# Patient Record
Sex: Male | Born: 1960 | Race: Black or African American | Hispanic: No | Marital: Married | Smoking: Current every day smoker
Health system: Southern US, Community
[De-identification: ages and names within clinical notes are randomized; demographics above are authoritative.]

## PROBLEM LIST (undated history)

## (undated) DIAGNOSIS — C801 Malignant (primary) neoplasm, unspecified: Secondary | ICD-10-CM

## (undated) DIAGNOSIS — I10 Essential (primary) hypertension: Secondary | ICD-10-CM

## (undated) DIAGNOSIS — S069XAA Unspecified intracranial injury with loss of consciousness status unknown, initial encounter: Secondary | ICD-10-CM

## (undated) DIAGNOSIS — I639 Cerebral infarction, unspecified: Secondary | ICD-10-CM

## (undated) DIAGNOSIS — E119 Type 2 diabetes mellitus without complications: Secondary | ICD-10-CM

## (undated) DIAGNOSIS — S069X9A Unspecified intracranial injury with loss of consciousness of unspecified duration, initial encounter: Secondary | ICD-10-CM

## (undated) DIAGNOSIS — M1990 Unspecified osteoarthritis, unspecified site: Secondary | ICD-10-CM

## (undated) DIAGNOSIS — G629 Polyneuropathy, unspecified: Secondary | ICD-10-CM

## (undated) DIAGNOSIS — G459 Transient cerebral ischemic attack, unspecified: Secondary | ICD-10-CM

---

## 2015-07-26 DIAGNOSIS — I219 Acute myocardial infarction, unspecified: Secondary | ICD-10-CM

## 2015-07-26 HISTORY — DX: Acute myocardial infarction, unspecified: I21.9

## 2016-05-30 ENCOUNTER — Inpatient Hospital Stay: Payer: BLUE CROSS/BLUE SHIELD | Admitting: Internal Medicine

## 2016-05-30 ENCOUNTER — Observation Stay: Payer: BLUE CROSS/BLUE SHIELD

## 2016-05-30 ENCOUNTER — Inpatient Hospital Stay
Admission: EM | Admit: 2016-05-30 | Discharge: 2016-06-01 | DRG: 313 | Disposition: A | Discharge status: Home / Self Care | Payer: BLUE CROSS/BLUE SHIELD | Attending: Family Medicine | Admitting: Family Medicine

## 2016-05-30 DIAGNOSIS — Z8673 Personal history of transient ischemic attack (TIA), and cerebral infarction without residual deficits: Secondary | ICD-10-CM

## 2016-05-30 DIAGNOSIS — G4733 Obstructive sleep apnea (adult) (pediatric): Secondary | ICD-10-CM | POA: Diagnosis present

## 2016-05-30 DIAGNOSIS — E119 Type 2 diabetes mellitus without complications: Secondary | ICD-10-CM | POA: Diagnosis present

## 2016-05-30 DIAGNOSIS — R079 Chest pain, unspecified: Secondary | ICD-10-CM | POA: Diagnosis present

## 2016-05-30 DIAGNOSIS — R9431 Abnormal electrocardiogram [ECG] [EKG]: Secondary | ICD-10-CM | POA: Diagnosis present

## 2016-05-30 DIAGNOSIS — R0789 Other chest pain: Principal | ICD-10-CM | POA: Diagnosis present

## 2016-05-30 DIAGNOSIS — Z794 Long term (current) use of insulin: Secondary | ICD-10-CM

## 2016-05-30 DIAGNOSIS — F17200 Nicotine dependence, unspecified, uncomplicated: Secondary | ICD-10-CM | POA: Diagnosis present

## 2016-05-30 DIAGNOSIS — I10 Essential (primary) hypertension: Secondary | ICD-10-CM | POA: Diagnosis present

## 2016-05-30 DIAGNOSIS — M549 Dorsalgia, unspecified: Secondary | ICD-10-CM | POA: Diagnosis present

## 2016-05-30 DIAGNOSIS — G8929 Other chronic pain: Secondary | ICD-10-CM | POA: Diagnosis present

## 2016-05-30 DIAGNOSIS — N179 Acute kidney failure, unspecified: Secondary | ICD-10-CM | POA: Diagnosis present

## 2016-05-30 DIAGNOSIS — E1165 Type 2 diabetes mellitus with hyperglycemia: Secondary | ICD-10-CM | POA: Diagnosis present

## 2016-05-30 DIAGNOSIS — Z7902 Long term (current) use of antithrombotics/antiplatelets: Secondary | ICD-10-CM

## 2016-05-30 HISTORY — DX: Transient cerebral ischemic attack, unspecified: G45.9

## 2016-05-30 HISTORY — DX: Unspecified osteoarthritis, unspecified site: M19.90

## 2016-05-30 HISTORY — DX: Type 2 diabetes mellitus without complications: E11.9

## 2016-05-30 HISTORY — DX: Polyneuropathy, unspecified: G62.9

## 2016-05-30 HISTORY — DX: Essential (primary) hypertension: I10

## 2016-05-30 LAB — CBC AND DIFFERENTIAL
Absolute NRBC: 0 10*3/uL
Basophils Absolute Automated: 0.03 10*3/uL (ref 0.00–0.20)
Basophils Automated: 0.6 %
Eosinophils Absolute Automated: 0.1 10*3/uL (ref 0.00–0.70)
Eosinophils Automated: 1.9 %
Hematocrit: 39.5 % — ABNORMAL LOW (ref 42.0–52.0)
Hgb: 14.1 g/dL (ref 13.0–17.0)
Immature Granulocytes Absolute: 0.01 10*3/uL
Immature Granulocytes: 0.2 %
Lymphocytes Absolute Automated: 1.09 10*3/uL (ref 0.50–4.40)
Lymphocytes Automated: 20.5 %
MCH: 30.9 pg (ref 28.0–32.0)
MCHC: 35.7 g/dL (ref 32.0–36.0)
MCV: 86.4 fL (ref 80.0–100.0)
MPV: 9.7 fL (ref 9.4–12.3)
Monocytes Absolute Automated: 0.37 10*3/uL (ref 0.00–1.20)
Monocytes: 7 %
Neutrophils Absolute: 3.72 10*3/uL (ref 1.80–8.10)
Neutrophils: 69.8 %
Nucleated RBC: 0 /100 WBC (ref 0.0–1.0)
Platelets: 300 10*3/uL (ref 140–400)
RBC: 4.57 10*6/uL — ABNORMAL LOW (ref 4.70–6.00)
RDW: 12 % (ref 12–15)
WBC: 5.32 10*3/uL (ref 3.50–10.80)

## 2016-05-30 LAB — COMPREHENSIVE METABOLIC PANEL
ALT: 26 U/L (ref 0–55)
AST (SGOT): 15 U/L (ref 5–34)
Albumin/Globulin Ratio: 0.9 (ref 0.9–2.2)
Albumin: 3 g/dL — ABNORMAL LOW (ref 3.5–5.0)
Alkaline Phosphatase: 69 U/L (ref 38–106)
Anion Gap: 14 (ref 5.0–15.0)
BUN: 22 mg/dL (ref 9.0–28.0)
Bilirubin, Total: 0.6 mg/dL (ref 0.2–1.2)
CO2: 26 mEq/L (ref 22–29)
Calcium: 9.3 mg/dL (ref 8.5–10.5)
Chloride: 97 mEq/L — ABNORMAL LOW (ref 100–111)
Creatinine: 1.6 mg/dL — ABNORMAL HIGH (ref 0.7–1.3)
Globulin: 3.5 g/dL (ref 2.0–3.6)
Glucose: 396 mg/dL — ABNORMAL HIGH (ref 70–100)
Potassium: 3.6 mEq/L (ref 3.5–5.1)
Protein, Total: 6.5 g/dL (ref 6.0–8.3)
Sodium: 137 mEq/L (ref 136–145)

## 2016-05-30 LAB — ECG 12-LEAD
Atrial Rate: 69 {beats}/min
P Axis: 61 degrees
P-R Interval: 148 ms
Q-T Interval: 408 ms
QRS Duration: 88 ms
QTC Calculation (Bezet): 437 ms
R Axis: 9 degrees
T Axis: 217 degrees
Ventricular Rate: 69 {beats}/min

## 2016-05-30 LAB — TROPONIN I
Troponin I: 0.01 ng/mL (ref 0.00–0.09)
Troponin I: 0.02 ng/mL (ref 0.00–0.09)

## 2016-05-30 LAB — GLUCOSE WHOLE BLOOD - POCT
Whole Blood Glucose POCT: 268 mg/dL — ABNORMAL HIGH (ref 70–100)
Whole Blood Glucose POCT: 367 mg/dL — ABNORMAL HIGH (ref 70–100)

## 2016-05-30 LAB — GFR: EGFR: 45.4

## 2016-05-30 LAB — HEMOLYSIS INDEX: Hemolysis Index: 12 (ref 0–18)

## 2016-05-30 LAB — IHS D-DIMER: D-Dimer: <0.27 ug/mL FEU (ref 0.00–0.51)

## 2016-05-30 MED ORDER — DEXTROSE 10 % IV BOLUS
125.0000 mL | INTRAVENOUS | Status: DC | PRN
Start: 2016-05-30 — End: 2016-06-01

## 2016-05-30 MED ORDER — LISINOPRIL 20 MG PO TABS
40.0000 mg | ORAL_TABLET | Freq: Every day | ORAL | Status: DC
Start: 2016-05-31 — End: 2016-06-01
  Administered 2016-05-31 – 2016-06-01 (×2): 40 mg via ORAL
  Filled 2016-05-30 (×2): qty 2

## 2016-05-30 MED ORDER — HYDROCHLOROTHIAZIDE 25 MG PO TABS
25.0000 mg | ORAL_TABLET | Freq: Every day | ORAL | Status: DC
Start: 2016-05-30 — End: 2016-06-01
  Administered 2016-05-31 – 2016-06-01 (×2): 25 mg via ORAL
  Filled 2016-05-30 (×3): qty 1

## 2016-05-30 MED ORDER — GLUCOSE 40 % PO GEL
15.0000 g | ORAL | Status: DC | PRN
Start: 2016-05-30 — End: 2016-06-01

## 2016-05-30 MED ORDER — GLUCAGON 1 MG IJ SOLR (WRAP)
1.0000 mg | INTRAMUSCULAR | Status: DC | PRN
Start: 2016-05-30 — End: 2016-06-01

## 2016-05-30 MED ORDER — NALOXONE HCL 0.4 MG/ML IJ SOLN (WRAP)
0.2000 mg | INTRAMUSCULAR | Status: DC | PRN
Start: 2016-05-30 — End: 2016-06-01

## 2016-05-30 MED ORDER — NORTRIPTYLINE HCL 25 MG PO CAPS
25.0000 mg | ORAL_CAPSULE | Freq: Every evening | ORAL | Status: DC
Start: 2016-05-30 — End: 2016-06-01
  Administered 2016-05-30 – 2016-05-31 (×2): 25 mg via ORAL
  Filled 2016-05-30 (×2): qty 1

## 2016-05-30 MED ORDER — INSULIN LISPRO 100 UNIT/ML SC SOLN
1.0000 [IU] | Freq: Every evening | SUBCUTANEOUS | Status: DC | PRN
Start: 2016-05-30 — End: 2016-05-31
  Administered 2016-05-30: 3 [IU] via SUBCUTANEOUS
  Filled 2016-05-30: qty 9

## 2016-05-30 MED ORDER — SODIUM CHLORIDE 0.9 % IV BOLUS
1000.0000 mL | Freq: Once | INTRAVENOUS | Status: AC
Start: 2016-05-30 — End: 2016-05-30
  Administered 2016-05-30: 1000 mL via INTRAVENOUS

## 2016-05-30 MED ORDER — INSULIN GLARGINE 100 UNIT/ML SC SOLN
30.0000 [IU] | Freq: Every evening | SUBCUTANEOUS | Status: DC
Start: 2016-05-30 — End: 2016-05-31
  Administered 2016-05-30: 30 [IU] via SUBCUTANEOUS
  Filled 2016-05-30: qty 30

## 2016-05-30 MED ORDER — AMLODIPINE BESYLATE 5 MG PO TABS
5.0000 mg | ORAL_TABLET | Freq: Every day | ORAL | Status: DC
Start: 2016-05-30 — End: 2016-06-01
  Administered 2016-05-31 – 2016-06-01 (×2): 5 mg via ORAL
  Filled 2016-05-30 (×3): qty 1

## 2016-05-30 MED ORDER — INSULIN LISPRO 100 UNIT/ML SC SOLN
2.0000 [IU] | Freq: Three times a day (TID) | SUBCUTANEOUS | Status: DC | PRN
Start: 2016-05-30 — End: 2016-05-31
  Administered 2016-05-30: 6 [IU] via SUBCUTANEOUS
  Administered 2016-05-31: 8 [IU] via SUBCUTANEOUS
  Filled 2016-05-30: qty 18
  Filled 2016-05-30: qty 24

## 2016-05-30 MED ORDER — ACETAMINOPHEN 325 MG PO TABS
650.0000 mg | ORAL_TABLET | Freq: Four times a day (QID) | ORAL | Status: DC | PRN
Start: 2016-05-30 — End: 2016-06-01
  Administered 2016-05-30: 650 mg via ORAL
  Filled 2016-05-30: qty 2

## 2016-05-30 MED ORDER — CLOPIDOGREL BISULFATE 75 MG PO TABS
75.0000 mg | ORAL_TABLET | Freq: Every day | ORAL | Status: DC
Start: 2016-05-31 — End: 2016-06-01
  Administered 2016-05-31 – 2016-06-01 (×2): 75 mg via ORAL
  Filled 2016-05-30 (×2): qty 1

## 2016-05-30 MED ORDER — METOPROLOL SUCCINATE ER 50 MG PO TB24
100.0000 mg | ORAL_TABLET | Freq: Every day | ORAL | Status: DC
Start: 2016-05-31 — End: 2016-06-01
  Administered 2016-05-31 – 2016-06-01 (×2): 100 mg via ORAL
  Filled 2016-05-30 (×2): qty 2

## 2016-05-30 MED ORDER — BUPROPION HCL ER (XL) 150 MG PO TB24
150.0000 mg | ORAL_TABLET | Freq: Every day | ORAL | Status: DC
Start: 2016-05-31 — End: 2016-06-01
  Administered 2016-05-31 – 2016-06-01 (×2): 150 mg via ORAL
  Filled 2016-05-30 (×2): qty 1

## 2016-05-30 MED ORDER — HEPARIN SODIUM (PORCINE) 5000 UNIT/ML IJ SOLN
5000.0000 [IU] | Freq: Three times a day (TID) | INTRAMUSCULAR | Status: DC
Start: 2016-05-30 — End: 2016-06-01
  Administered 2016-05-30 – 2016-06-01 (×6): 5000 [IU] via SUBCUTANEOUS
  Filled 2016-05-30 (×6): qty 1

## 2016-05-30 MED ORDER — INSULIN REGULAR HUMAN 100 UNIT/ML IJ SOLN
10.0000 [IU] | Freq: Once | INTRAMUSCULAR | Status: AC
Start: 2016-05-30 — End: 2016-05-30
  Administered 2016-05-30: 10 [IU] via INTRAVENOUS
  Filled 2016-05-30: qty 30

## 2016-05-30 NOTE — ED Notes (Signed)
Bed: PU36  Expected date:   Expected time:   Means of arrival:   Comments:  Medic 202- cp

## 2016-05-30 NOTE — ED Provider Notes (Signed)
EMERGENCY DEPARTMENT HISTORY AND PHYSICAL EXAM     Physician/Midlevel provider first contact with patient: 05/30/16 1059         Date: 05/30/2016  Patient Name: Adam Bridges    History of Presenting Illness     Chief Complaint   Patient presents with   . Chest Pain   . Shortness of Breath       History Provided By: Patient    Chief Complaint: Chest pain  Duration: Beginning at 9:30 AM, now resolved  Timing: Constant  Location: Left-sided  Quality: "Dull"  Severity: Moderate  Exacerbating factors: Hx of DM and HTN  Alleviating factors: 324 mg Aspirin and 1 NTG spray given by EMS  Associated Symptoms: Near-syncope, SOB  Pertinent Negatives: No numbness or weakness    Additional History: Adam Bridges is a 55 y.o. male with hx of DM, HTN, and TIA presenting to the ED with CP beginning this morning at ~9:30 AM. 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 He describes the pain as "dull" on his left side. He also felt like he might lose consciousness at this time. He then sat down and began experiencing SOB. He states that his symptoms were not worse with exertion. He denies new numbness or weakness or family hx of heart disease. He states that he recently drove to [place] (4 hour drive), he took breaks during the trip. He is a social drinking, has a cigar occasionally. He was given 324 mg Aspirin and 1 nitro spray en route by EMS and now his symptoms are resolved.     PCP: Lum Keas, MD  SPECIALISTS: Dr.Srinivasan [facility]), [phone].    Current Facility-Administered Medications   Medication Dose Route Frequency Provider Last Rate Last Dose   . insulin regular (HumuLIN R,NovoLIN R) injection 10 Units  10 Units Intravenous Once Thea Gist, MD         Current Outpatient Prescriptions   Medication Sig Dispense Refill   . amLODIPine (NORVASC) 5 MG tablet Take 5 mg by mouth daily.     . Azilsartan-Chlorthalidone (EDARBYCLOR PO)  Take by mouth.     . clopidogrel (PLAVIX) 75 mg tablet Take 75 mg by mouth daily.     . insulin aspart (NOVOLOG) 100 UNIT/ML injection Inject into the skin 3 (three) times daily before meals.     . insulin detemir (LEVEMIR) 100 UNIT/ML injection Inject into the skin.     Marland Kitchen lisinopril (PRINIVIL,ZESTRIL) 40 MG tablet Take 40 mg by mouth daily.     . metFORMIN (FORTAMET) 500 MG (OSM) 24 hr tablet Take 500 mg by mouth every morning with breakfast.     . metoprolol succinate (TOPROL-XL) 100 MG 24 hr tablet Take 100 mg by mouth daily.     . nortriptyline (PAMELOR) 25 MG capsule Take 25 mg by mouth nightly.         Past History     Past Medical History:  Past Medical History:   Diagnosis Date   . Diabetes mellitus    . Hypertension    . TIA (transient ischemic attack)        Past Surgical History:  History reviewed. No pertinent surgical history.    Family History:  No family history on file.    Social History:  Social History   Substance Use Topics   . Smoking status: Light Tobacco Smoker     Types: Cigars   .  Smokeless tobacco: Never Used   . Alcohol use Yes      Comment: social       Allergies:  No Known Allergies    Review of Systems     Review of Systems   Constitutional: Positive for fatigue. Negative for fever.   Eyes: Negative for visual disturbance.   Respiratory: Positive for shortness of breath. Negative for chest tightness.    Cardiovascular: Positive for chest pain. Negative for palpitations.   Gastrointestinal: Negative for abdominal pain, nausea and vomiting.   Genitourinary: Negative for flank pain.   Musculoskeletal: Negative for back pain and neck pain.   Skin: Negative for pallor.   Neurological: Negative for weakness and numbness (chronic neuropathy, no new symptoms).        + Near-syncope   Psychiatric/Behavioral: Negative for agitation.       Physical Exam   BP 116/72   Pulse 71   Temp 98 F (36.7 C) (Oral)   Resp 16   SpO2 96%     Physical Exam   Constitutional: He is oriented to person, place,  and time. He appears well-developed and well-nourished.   HENT:   Head: Normocephalic and atraumatic.   Eyes: EOM are normal.   Neck: Normal range of motion. No JVD present.   Cardiovascular: Normal rate and intact distal pulses.    Pulmonary/Chest: Effort normal. No respiratory distress.   Abdominal: Soft. He exhibits no distension. There is no tenderness. There is no rebound.   Musculoskeletal: Normal range of motion. He exhibits no edema.   Neurological: He is alert and oriented to person, place, and time. He has normal strength. No cranial nerve deficit or sensory deficit. He exhibits normal muscle tone. Coordination normal.   Skin: Skin is warm and dry. No rash noted.   Psychiatric: He has a normal mood and affect. His behavior is normal.       Diagnostic Study Results     Labs -     Results     Procedure Component Value Units Date/Time    D-Dimer [[id number]] Collected:  05/30/16 1112     Updated:  05/30/16 1148     D-Dimer <0.27 ug/mL FEU     Troponin I [[id number]] Collected:  05/30/16 1113    Specimen:  Blood Updated:  05/30/16 1139     Troponin I 0.01 ng/mL     Comprehensive metabolic panel [[id number]]  (Abnormal) Collected:  05/30/16 1113    Specimen:  Blood Updated:  05/30/16 1133     Glucose 396 (H) mg/dL      BUN 08.6 mg/dL      Creatinine 1.6 (H) mg/dL      Sodium 578 mEq/L      Potassium 3.6 mEq/L      Chloride 97 (L) mEq/L      CO2 26 mEq/L      Calcium 9.3 mg/dL      Protein, Total 6.5 g/dL      Albumin 3.0 (L) g/dL      AST (SGOT) 15 U/L      ALT 26 U/L      Alkaline Phosphatase 69 U/L      Bilirubin, Total 0.6 mg/dL      Globulin 3.5 g/dL      Albumin/Globulin Ratio 0.9     Anion Gap 14.0    Hemolysis index [[id number]] Collected:  05/30/16 1113     Updated:  05/30/16 1133     Hemolysis Index 12    GFR [  [id number]] Collected:  05/30/16 1113     Updated:  05/30/16 1133     EGFR 45.4    CBC with differential [[id number]]  (Abnormal) Collected:  05/30/16 1112    Specimen:  Blood from Blood Updated:   05/30/16 1119     WBC 5.32 x10 3/uL      Hgb 14.1 g/dL      Hematocrit 78.2 (L) %      Platelets 300 x10 3/uL      RBC 4.57 (L) x10 6/uL      MCV 86.4 fL      MCH 30.9 pg      MCHC 35.7 g/dL      RDW 12 %      MPV 9.7 fL      Neutrophils 69.8 %      Lymphocytes Automated 20.5 %      Monocytes 7.0 %      Eosinophils Automated 1.9 %      Basophils Automated 0.6 %      Immature Granulocyte 0.2 %      Nucleated RBC 0.0 /100 WBC      Neutrophils Absolute 3.72 x10 3/uL      Abs Lymph Automated 1.09 x10 3/uL      Abs Mono Automated 0.37 x10 3/uL      Abs Eos Automated 0.10 x10 3/uL      Absolute Baso Automated 0.03 x10 3/uL      Absolute Immature Granulocyte 0.01 x10 3/uL      Absolute NRBC 0.00 x10 3/uL           Radiologic Studies -   Radiology Results (24 Hour)     ** No results found for the last 24 hours. **      .    Medical Decision Making   I am the first provider for this patient.    I reviewed the vital signs, available nursing notes, past medical history, past surgical history, family history and social history.    Vital Signs-Reviewed the patient's vital signs.     Patient Vitals for the past 12 hrs:   BP Temp Pulse Resp   05/30/16 1220 116/72 - 71 16   05/30/16 1053 132/80 98 F (36.7 C) 71 16       Pulse Oximetry Analysis - Normal 97% on RA    Cardiac Monitor:  Rate: 69  Rhythm:  Normal Sinus Rhythm     EKG:  Interpreted by the EP.   Time Interpreted: 1102   Rate: 69   Rhythm: Normal Sinus Rhythm    Interpretation: Normal PR, QRS and QT intervals. No ST segment changes. T wave inversions in V4-V6, leads I, II, aVL and aVF.   Comparison: No prior study is available for comparison.       EKG (Sent from cardiologist):  Interpreted by the EP.   Time Interpreted: 1247   Rate: 88   Rhythm: Normal Sinus Rhythm   Comparison: Compared to EKG from 1102 T wave inversions are new.      Core Measures:   Adult Chest Pain:  12-lead EKG was performed in the ED. Aspirin was not given in ED because given en route by  EMS.      Admit Decision Time:  The decision to admit this patient was made by the emergency provider at 1:14 PM on 05/30/2016     Clinical Decision Support:   Heart Score    Flowsheet Row Value   History  1  EKG  1   Risk Factors  1   Total (with age)  4   Onset of pain (time of START of last episode of chest pain)?  0-3 hrs ago   Timing of repeat Troponin/EKG order  HEART Score exceeds limit for Chest Pain Pathway      Wells/PERC Score    Flowsheet Row Value   Wells Score   Previous DVT or PE  0   HR greater than 100  0   Surgery within 4 weeks, or Immobilization in last 3 days  0   Clinical Signs/Symptoms of DVT  0   Alternative diagnosis less likely than PE  0   Hemoptysis  0   Malignancy with treatment within 6 months or palliative care  0   Wells Score for PE  0   Wells Rule Risk  Low Risk: <2 points (Continue to [facility] Calculator)   PERC Score   Age (read only)  greater than 50 years   HR >= 100  0   O2 Sat on Room Air < 95%  0   Prior History of Venous Thromboembolism  0   Trauma or Surgery within 4 weeks  0   Hemoptysis  0   Exogenous Estrogen  0   Unilateral Leg Swelling  0   PERC Rule Score (Calculated)  1   PERC Rule Risk  PERC rule is not satisfied and cannot be used to rule out PE.          Old Medical Records: Nursing notes.     ED Course:   11:28 AM - Discussed symptoms and plan for lab testing and chest XR. Patient is agreeable.    11:53 AM - Checked on patient and discussed abnormal EKG and heart score. Patient will try to have co-worker bring past EKG for comparison. Unsure of name of cardiologist. Patient states that he had a normal stress test in February, 2017.    12:27 PM - Checked on patient who is still working on getting EKG from cardiologist and name of cardiologist. Discussed results lab tests.     12:40 PM - Patient's wife states that cardiologist is Dr.Srinivasan [facility] [place]), [phone]. Will call for consult.     12:45 PM - Discussed patient case with  Dr.Srinivasan (cardiologist) who states that T wave inversions are new. He will fax over old EKG.    12:47 PM - Updated patient on conversation with Dr.Srinivasan and recommended hospitalization, he is agreeable. Patient states that he did not take his DM medication this morning, will order.     1:01 PM - Discussed patient case with Dr.Rubin (cardiologist) who will consult.     1:14 PM - Discussed patient case with Dr.Abdulaaima (sound, internal medicine) who agrees to accept.     1:20 PM - Discussed patient case with Dr.Rubin (cardiologist) who is consulting at bedside.     Provider Notes:   Based on my history, physical exam, and diagnostic evaluation, the patient presents to the emergency department with chest pain suggestive of ischemia. The pt has cardiac risk factors diabetes, tia, htn. Their physical exam was unremarkable including normal chest and respiratory exam. The diagnostic evaluation including laboratory data, and x-ray were non-diagnostic. Based on this evaluation, I do believe the symptoms are concerning for acute ischemic chest pain. I do not believe this is a vascular catastrophe such as a dissection or an acute rupture of an AAA. The pt is low risk for acute thrombo-embolic  phenomena. I discussed the case with the admitting attendings      Diagnosis     Clinical Impression:   1. Chest pain in adult        Treatment Plan:   ED Disposition     ED Disposition Condition Date/Time Comment    Observation  Mon May 30, 2016  1:16 PM Admitting Physician: Jolyn Lent [[id number]]   Diagnosis: Chest pain in adult [[id number]]   Estimated Length of Stay: < 2 midnights   Tentative Discharge Plan?: Home or Self Care [1]   Patient Class: Observation [104]              _______________________________      Attestations: This note is prepared by Fabio Bering, acting as scribe for Geannie Risen, MD.    Geannie Risen, MD - The scribe's documentation has been prepared under my direction and personally  reviewed by me in its entirety.  I confirm that the note above accurately reflects all work, treatment, procedures, and medical decision making performed by me.    _______________________________       Thea Gist, MD  06/03/16 [phone]

## 2016-05-30 NOTE — Plan of Care (Signed)
Problem: Chest Pain  Goal: Vital signs and cardiac rhythm stable  Outcome: Progressing   05/30/16 2158   Goal/Interventions addressed this shift   Vital signs and cardiac rhythm stable Monitor Adam Bridges vital signs/cardiac rhythms;Monitor labs

## 2016-05-30 NOTE — ED Triage Notes (Signed)
Adam Bridges BIBA from work cc chest pain. Pt reports pain started around 0930, and felt dull chest pain on the left side with some SOB. Pt has hx of TIA. EMS reports giving 324 mg aspirin @1037  and 1 nitro spray @ 1038 in route, which relieved pt's pain and sx. Pt denies N/V. EMS reports normal 12 L and vital signs. Pt reports dexi of 324 this AM.

## 2016-05-30 NOTE — Plan of Care (Signed)
Problem: Diabetes: Glucose Imbalance  Goal: Blood glucose stable at established goal  Outcome: Progressing   05/30/16 2157   Goal/Interventions addressed this shift   Blood glucose stable at established goal  Monitor lab values;Monitor intake and output. Notify LIP if urine output is < 30 mL/hour.;Assess for hypoglycemia /hyperglycemia;Monitor/assess vital signs;Coordinate medication administration with meals, as indicated;Ensure adequate hydration;Ensure appropriate diet and assess tolerance

## 2016-05-30 NOTE — ED Notes (Signed)
NURSING NOTE FOR THE RECEIVING INPATIENT NURSE   ED NURSE [place]   [place] [address]   ADMISSION INFORMATION   Adam Bridges is a 55 y.o. male admitted with a diagnosis of:    1. Chest pain in adult       NURSING CARE   LOC Oriented to person, place, time, and general circumstances.   ADL Independent with all ADLs   Pertinent Information Pt has hx of TIAs    VITAL SIGNS     Vitals:    05/30/16 1220   BP: 116/72   Pulse: 71   Resp: 16   Temp:    SpO2: 96%        IV LINES     IV Catheter Size: 20 g    Peripheral IV 05/30/16 Left Hand (Active)   Site Assessment Clean;Dry;Intact 05/30/2016 11:12 AM   Line Status Saline Locked 05/30/2016 11:12 AM   Dressing Status Clean;Dry;Intact 05/30/2016 11:12 AM   Number of days: 0          LAB RESULTS     Labs Reviewed   COMPREHENSIVE METABOLIC PANEL - Abnormal; Notable for the following:        Result Value    Glucose 396 (*)     Creatinine 1.6 (*)     Chloride 97 (*)     Albumin 3.0 (*)     All other components within normal limits   CBC AND DIFFERENTIAL - Abnormal; Notable for the following:     Hematocrit 39.5 (*)     RBC 4.57 (*)     All other components within normal limits   TROPONIN I   HEMOLYSIS INDEX   GFR   [facility] D-DIMER

## 2016-05-30 NOTE — H&P (Addendum)
[facility]      Patient: Adam Bridges  Date: 05/30/2016   DOB: Jan 31, 1961  Admission Date: 05/30/2016   MRN: [id number]  Attending: Jolyn Lent         Chief Complaint   Patient presents with   . Chest Pain   . Shortness of Breath      History Gathered From: the patient and ER physician     HISTORY AND PHYSICAL     SHEMUEL Bridges is a 55 y.o. 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     Past Medical History:   Diagnosis Date   . Diabetes mellitus    . Hypertension    . TIA (transient ischemic attack)        History reviewed. No pertinent surgical history.    Prior to Admission medications    Medication Sig Start Date End Date Taking? Authorizing Provider   amLODIPine (NORVASC) 5 MG tablet Take 5 mg by mouth daily.   Yes [provider]   Azilsartan-Chlorthalidone (EDARBYCLOR) 40-12.5 MG Tab Take by mouth.   Yes [provider]   buPROPion XL (WELLBUTRIN XL) 150 MG 24 hr tablet Take 150 mg by mouth daily.   Yes [provider]   clopidogrel (PLAVIX) 75 mg tablet Take 75 mg by mouth daily.   Yes [provider]   insulin aspart (NOVOLOG) 100 UNIT/ML injection Inject into the skin 3 (three) times daily before meals.Per sliding scale       Yes [provider]   insulin detemir (LEVEMIR) 100 UNIT/ML injection Inject 60 Units into the skin nightly.Per sliding scale       Yes [provider]   lisinopril (PRINIVIL,ZESTRIL) 40 MG tablet Take 40 mg by mouth daily.   Yes [provider]   metFORMIN (FORTAMET) 500 MG (OSM) 24 hr tablet Take 1,000 mg by mouth 2 (two) times daily with  meals.       Yes [provider]   metoprolol succinate (TOPROL-XL) 100 MG 24 hr tablet Take 100 mg by mouth daily.   Yes [provider]   nortriptyline (PAMELOR) 25 MG capsule Take 25 mg by mouth nightly.   Yes [provider]   Azilsartan-Chlorthalidone (EDARBYCLOR PO) Take by mouth.  05/30/16 Yes [provider]   buPROPion (ZYBAN) 150 MG 12 hr tablet Take 150 mg by mouth 2 (two) times daily.  05/30/16 Yes [provider]       No Known Allergies    CODE STATUS: Full code     PRIMARY CARE MD: Lum Keas, MD    No family history on file.    Social History   Substance Use Topics   . Smoking status: Light Tobacco Smoker     Types: Cigars   . Smokeless tobacco: Never Used   . Alcohol use Yes      Comment: social       REVIEW OF SYSTEMS   Positive for: CP, SOB, headache  Negative for: nausea, vomiting, abdominal pain, constipation, diarrhea, dizziness, sore throat, cough,  fever, chills   All ROS completed and otherwise negative.    PHYSICAL EXAM     Vital Signs (most recent): BP 116/72   Pulse 71   Temp 98 F (36.7 C) (Oral)   Resp 16   SpO2 96%   Constitutional: No apparent distress. Patient speaks freely in full sentences. Very pleasant obese middle aged male   HEENT: NC/AT, PERRL, no scleral icterus or conjunctival pallor, no nasal discharge, MMM, oropharynx without erythema or exudate  Neck: trachea midline, supple, no cervical or supraclavicular lymphadenopathy or masses  Cardiovascular: RRR, normal S1 S2, no murmurs, gallops, palpable thrills, no JVD, Non-displaced PMI.  Respiratory: Normal rate. No retractions or increased work of breathing. Clear to auscultation and percussion bilaterally.  Gastrointestinal: +BS, non-distended, soft, non-tender, no rebound or guarding, no hepatosplenomegaly  Genitourinary: no suprapubic or costovertebral angle tenderness  Musculoskeletal: ROM and motor strength grossly normal. No clubbing, edema, or cyanosis. DP and  radial pulses 2+ and symmetric.  Skin exam:  pink  Neurologic: EOMI, CN 2-12 grossly intact. no gross motor or sensory deficits  Psychiatric: AAOx3, affect and mood appropriate. The patient is alert, interactive, appropriate.  Capillary refill:  Normal    Exam done by Jolyn Lent, MD on 05/30/16 at 1:40 PM      LABS & IMAGING     Recent Results (from the past 24 hour(s))   CBC with differential    Collection Time: 05/30/16 11:12 AM   Result Value Ref Range    WBC 5.32 3.50 - 10.80 x10 3/uL    Hgb 14.1 13.0 - 17.0 g/dL    Hematocrit 16.1 (L) 42.0 - 52.0 %    Platelets 300 140 - 400 x10 3/uL    RBC 4.57 (L) 4.70 - 6.00 x10 6/uL    MCV 86.4 80.0 - 100.0 fL    MCH 30.9 28.0 - 32.0 pg    MCHC 35.7 32.0 - 36.0 g/dL    RDW 12 12 - 15 %    MPV 9.7 9.4 - 12.3 fL    Neutrophils 69.8 None %    Lymphocytes Automated 20.5 None %    Monocytes 7.0 None %    Eosinophils Automated 1.9 None %    Basophils Automated 0.6 None %    Immature Granulocyte 0.2 None %    Nucleated RBC 0.0 0.0 - 1.0 /100 WBC    Neutrophils Absolute 3.72 1.80 - 8.10 x10 3/uL    Abs Lymph Automated 1.09 0.50 - 4.40 x10 3/uL    Abs Mono Automated 0.37 0.00 - 1.20 x10 3/uL    Abs Eos Automated 0.10 0.00 - 0.70 x10 3/uL    Absolute Baso Automated 0.03 0.00 - 0.20 x10 3/uL    Absolute Immature Granulocyte 0.01 0 x10 3/uL    Absolute NRBC 0.00 0 x10 3/uL   D-Dimer    Collection Time: 05/30/16 11:12 AM   Result Value Ref Range    D-Dimer <0.27 0.00 - 0.51 ug/mL FEU   Comprehensive metabolic panel    Collection Time: 05/30/16 11:13 AM   Result Value Ref Range    Glucose 396 (H) 70 - 100 mg/dL    BUN 09.6 9.0 - 04.5 mg/dL    Creatinine 1.6 (H) 0.7 - 1.3 mg/dL    Sodium 409 811 - 914 mEq/L    Potassium 3.6 3.5 - 5.1 mEq/L    Chloride 97 (L) 100 - 111 mEq/L    CO2 26 22 - 29 mEq/L  Calcium 9.3 8.5 - 10.5 mg/dL    Protein, Total 6.5 6.0 - 8.3 g/dL    Albumin 3.0 (L) 3.5 - 5.0 g/dL    AST (SGOT) 15 5 - 34 U/L    ALT 26 0 - 55 U/L    Alkaline Phosphatase 69 38 -  106 U/L    Bilirubin, Total 0.6 0.2 - 1.2 mg/dL    Globulin 3.5 2.0 - 3.6 g/dL    Albumin/Globulin Ratio 0.9 0.9 - 2.2    Anion Gap 14.0 5.0 - 15.0   Troponin I    Collection Time: 05/30/16 11:13 AM   Result Value Ref Range    Troponin I 0.01 0.00 - 0.09 ng/mL   Hemolysis index    Collection Time: 05/30/16 11:13 AM   Result Value Ref Range    Hemolysis Index 12 0 - 18   GFR    Collection Time: 05/30/16 11:13 AM   Result Value Ref Range    EGFR 45.4          IMAGING:  Upon my review:   Chest 2 Views    Result Date: 05/30/2016    trace left pleural effusion Lorinda Creed, MD 05/30/2016 2:05 PM       CARDIAC:  EKG Interpretation (upon my review):  NSR with T waves inversion in V4-V6, I,II and aVL and aVF     Markers:    Recent Labs  Lab 05/30/16  1113   Troponin I 0.01       EMERGENCY DEPARTMENT COURSE:  Orders Placed This Encounter   Procedures   . Chest 2 Views   . Comprehensive metabolic panel   . CBC with differential   . Troponin I   . Hemolysis index   . GFR   . D-Dimer   . Cardiac monitoring (ED ONLY)   . ED Unit Sec Comm Order   . ED Unit Sec Comm Order   . ECG 12 Lead   . Saline lock IV   . Place (admit) for Observation Services   . [facility] ED Bed Request (Observation)       ASSESSMENT & PLAN     BIRCH FARINO is a 55 y.o. male admitted under sound physicians observation  with Chest pain in adult.    Patient Active Hospital Problem List:    Chest pain in adult (05/30/2016)  R/O ACS   D dimer negative ruling out PE     Serial cardiac enzymes   ChecK echocardiogram   Tele monitor   Cardiac consult   Continue plavix   Pain control      HTN (hypertension) (05/30/2016)  Chronic   Continue home meds   Patient advised that he is on ARBs and ACEi and should stop one of them and he stated that he will discuss with PCP      DM type 2 (diabetes mellitus, type 2) (05/30/2016)   check HbA1C    POCT FS with lispro SS    hold metformin   lantus decrease to 30 units at bedtime since he will be NPO after Midnight     H/O TIA    Continue Plavix     Nutrition  Cardiac carb consistent diet     DVT/VTE Prophylaxis  Heparin sub Q     Anticipated medical stability for discharge: 1-2 night     Service status/Reason for ongoing hospitalization: Observation/ Chest pain     Anticipated Discharge Needs: TBD     Signed,  Jolyn Lent    05/30/2016 1:40 PM  Time Elapsed: 45 minutes

## 2016-05-30 NOTE — Plan of Care (Signed)
Problem: Safety  Goal: Patient will be free from injury during hospitalization  Outcome: Progressing   05/30/16 2157   Goal/Interventions addressed this shift   Patient will be free from injury during hospitalization  Assess patient's risk for falls and implement fall prevention plan of care per policy;Provide and maintain safe environment;Hourly rounding

## 2016-05-30 NOTE — Plan of Care (Signed)
Problem: Chest Pain  Goal: Vital signs and cardiac rhythm stable  Outcome: Progressing   05/30/16 1544   Goal/Interventions addressed this shift   Vital signs and cardiac rhythm stable Monitor /assess vital signs/cardiac rhythms;Assess the need for oxygen therapy and administer as ordered     Goal: Cardiac pain management  Outcome: Progressing   05/30/16 1544   Goal/Interventions addressed this shift   Cardiac pain management  Assess/report chest pain/or related discomfort to LIP immediately;Instruct patient to report any change in pain status;Assess pain/or related discomfort on admission, during daily assessment, before and after any intervention;Include patient and patient care companion in decisions related to pain management     Goal: Anxiety management/effective coping  Outcome: Progressing   05/30/16 1544   Goal/Interventions addressed this shift   Anxiety management/effective coping  Assess/report uncontrolled anxiety, depression or ineffective coping to LIP;Encourage patient to immediately report any increase in anxiety and/or depression;Include patient in decision making of their care and give updates on their health status     Goal: Patient/Patient Care Companion demonstrates understanding of disease process, treatment plan, medications, and discharge plan  Outcome: Progressing      Problem: Diabetes: Glucose Imbalance  Goal: Blood glucose stable at established goal  Outcome: Progressing      Comments: PT is an admit from ED around 1440 via Stretcher. Pt denies any discomfort. NO apparent distress noted. Admitting VSS. Pt oriented to new environment and call bell and encouraged to call for assistance prn.

## 2016-05-30 NOTE — Consults (Signed)
[place] [facility] CONSULTATION REPORT  [facility]    Date Time: 05/30/16 1:03 PM  Patient Name: Adam Bridges, Adam Bridges  Date of Birth: 12-31-1960  CSN: [id number]  MRN: [id number]  Requesting Physician: Thea Gist, MD    Reason for Consultation:   Chest Pain  Abnormal EKG      History:   HORACIO WERTH is Bridges 55 y.o. male who presents to the hospital on 05/30/2016 with chest pain.  Multiple risk factors, no known h/o CAD - followed by Bridges cardiologist in [facility] with prior "normal" noninvasive ischemic evaluations.    This Am noted onset of Bridges left sided chest pain and felt like he was "going to pass out". Pain lasted 45 min up to an hour, since resolved. No prior similar episodes.   Asymptomatic when seen in ER      Past Medical History:     Past Medical History:   Diagnosis Date   . Diabetes mellitus    . Hypertension    . TIA (transient ischemic attack)        Past Surgical History:   History reviewed. No pertinent surgical history.    Family History:   No family history on file.    Social History:     Social History     Social History   . Marital status: Married     Spouse name: N/Bridges   . Number of children: N/Bridges   . Years of education: N/Bridges     Social History Main Topics   . Smoking status: Light Tobacco Smoker     Types: Cigars   . Smokeless tobacco: Never Used   . Alcohol use Yes      Comment: social   . Drug use: Unknown   . Sexual activity: Not on file     Other Topics Concern   . Not on file     Social History Narrative   . No narrative on file       Allergies:   No Known Allergies    Hospital Medications:      Scheduled Meds: PRN Meds:      sodium chloride 1,000 mL Intravenous Once       Continuous Infusions:             Home Medications:     . amLODIPine (NORVASC) 5 MG tablet Take 5 mg by mouth daily.     . Azilsartan-Chlorthalidone (EDARBYCLOR PO) Take by mouth.     Marland Kitchen buPROPion (ZYBAN) 150 MG 12 hr tablet Take 150 mg by mouth 2 (two) times daily.     . clopidogrel (PLAVIX) 75 mg tablet Take  75 mg by mouth daily.     . insulin aspart (NOVOLOG) 100 UNIT/ML injection Inject into the skin 3 (three) times daily before meals.     . insulin detemir (LEVEMIR) 100 UNIT/ML injection Inject into the skin.     Marland Kitchen lisinopril (PRINIVIL,ZESTRIL) 40 MG tablet Take 40 mg by mouth daily.     . metFORMIN (FORTAMET) 500 MG (OSM) 24 hr tablet Take 500 mg by mouth every morning with breakfast.     . metoprolol succinate (TOPROL-XL) 100 MG 24 hr tablet Take 100 mg by mouth daily.     . nortriptyline (PAMELOR) 25 MG capsule Take 25 mg by mouth nightly.                Review of Systems:    Comprehensive review of systems including constitutional, eyes, ears, nose, mouth, throat,  cardiovascular, GI, GU, musculoskeletal, integumentary, respiratory, neurologic, psychiatric, and endocrine is negative other than what is mentioned already in the history of present illness    Physical Exam:     VITAL SIGNS   Temp:  [98 F (36.7 C)] 98 F (36.7 C)  Heart Rate:  [71] 71  Resp Rate:  [16] 16  BP: (116-132)/(72-80) 116/72  No Data Recorded  SpO2: 96 %  No intake or output data in the 24 hours ending 05/30/16 1303       GENERAL: Patient is in no acute distress   HEENT: No scleral icterus or conjunctival pallor, moist mucous membranes   NECK: No jugular venous distention, normal carotid upstrokes without bruits   CARDIAC: Regular rate and rhythm, with normal S1 and S2, sysytolic murmur  CHEST: Clear to auscultation bilaterally, normal respiratory effort  ABDOMEN: Nontender, non-distended  EXTREMITIES: No edema  PULSES: Full and equal bilaterally.  SKIN: No rash or jaundice   NEUROLOGIC: Alert and oriented to time, place and person, normal mood and affect   MUSCULOSKELETAL: Normal muscle strength and tone.      Labs:     CBC w/Diff     Recent Labs  Lab 05/30/16  1112   WBC 5.32   Hgb 14.1   Hematocrit 39.5*   Platelets 300          Comprehensive Metabolic Profile     Recent Labs  Lab 05/30/16  1113   Sodium 137   Potassium  3.6   Chloride 97*   CO2 26   BUN 22.0   Creatinine 1.6*   Calcium 9.3   Albumin 3.0*   Protein, Total 6.5   Bilirubin, Total 0.6   Alkaline Phosphatase 69   ALT 26   AST (SGOT) 15   Glucose 396*          Cardiac Enzymes     Recent Labs  Lab 05/30/16  1113   Troponin I 0.01       No results for input(s): BNP in the last 8760 hours.       Thyroid Studies          Invalid input(s): FREET4        Lipid Profile            Coagulation Studies              EKG:   NSR Nonspecific ST-T changes      Telemetry:   NSR in ER    Rads:   No results found.     Assessment:    Chest Pain - lasted to 1 hours - now resolved   DM - glucose 396   HTN   Renal insufficiency - Cr 1.6   Abnormal EKG   H/o TIA   Chronic back pain - overall level of activity limited   OSA - Bipap    Recommendations:    Serial troponins. Would have low threshold with proceeding to cardiac cath. If troponins remain negative would proceed with Lexiscan though if troponins elevate then anticipate cardiac cath.   Echo   Check CXR - pending in ER    Signed by: Sandria Senter, MD    [facility]  NP Spectralink [phone] (8am-5pm)  MD Spectralink [phone] or [phone]  Dr Letitia Neri [phone] (8am-5pm)  After hours, non urgent consult line [phone]  After Hours, urgent consults [phone]

## 2016-05-31 ENCOUNTER — Inpatient Hospital Stay: Payer: BLUE CROSS/BLUE SHIELD

## 2016-05-31 ENCOUNTER — Ambulatory Visit (INDEPENDENT_AMBULATORY_CARE_PROVIDER_SITE_OTHER): Payer: Self-pay

## 2016-05-31 ENCOUNTER — Observation Stay: Payer: BLUE CROSS/BLUE SHIELD

## 2016-05-31 LAB — LIPID PANEL
Cholesterol / HDL Ratio: 5.4
Cholesterol: 211 mg/dL — ABNORMAL HIGH (ref 0–199)
HDL: 39 mg/dL — ABNORMAL LOW (ref 40–9999)
LDL Calculated: 145 mg/dL — ABNORMAL HIGH (ref 0–99)
Triglycerides: 137 mg/dL (ref 34–149)
VLDL Calculated: 27 mg/dL (ref 10–40)

## 2016-05-31 LAB — CBC AND DIFFERENTIAL
Absolute NRBC: 0 10*3/uL
Basophils Absolute Automated: 0.02 10*3/uL (ref 0.00–0.20)
Basophils Automated: 0.4 %
Eosinophils Absolute Automated: 0.34 10*3/uL (ref 0.00–0.70)
Eosinophils Automated: 6.5 %
Hematocrit: 37.9 % — ABNORMAL LOW (ref 42.0–52.0)
Hgb: 13.3 g/dL (ref 13.0–17.0)
Immature Granulocytes Absolute: 0.01 10*3/uL
Immature Granulocytes: 0.2 %
Lymphocytes Absolute Automated: 1.96 10*3/uL (ref 0.50–4.40)
Lymphocytes Automated: 37.3 %
MCH: 30.9 pg (ref 28.0–32.0)
MCHC: 35.1 g/dL (ref 32.0–36.0)
MCV: 88.1 fL (ref 80.0–100.0)
MPV: 9.8 fL (ref 9.4–12.3)
Monocytes Absolute Automated: 0.41 10*3/uL (ref 0.00–1.20)
Monocytes: 7.8 %
Neutrophils Absolute: 2.51 10*3/uL (ref 1.80–8.10)
Neutrophils: 47.8 %
Nucleated RBC: 0 /100 WBC (ref 0.0–1.0)
Platelets: 279 10*3/uL (ref 140–400)
RBC: 4.3 10*6/uL — ABNORMAL LOW (ref 4.70–6.00)
RDW: 12 % (ref 12–15)
WBC: 5.25 10*3/uL (ref 3.50–10.80)

## 2016-05-31 LAB — BASIC METABOLIC PANEL
Anion Gap: 13 (ref 5.0–15.0)
BUN: 20 mg/dL (ref 9.0–28.0)
CO2: 24 mEq/L (ref 22–29)
Calcium: 8.6 mg/dL (ref 8.5–10.5)
Chloride: 99 mEq/L — ABNORMAL LOW (ref 100–111)
Creatinine: 1.4 mg/dL — ABNORMAL HIGH (ref 0.7–1.3)
Glucose: 343 mg/dL — ABNORMAL HIGH (ref 70–100)
Potassium: 3.4 mEq/L — ABNORMAL LOW (ref 3.5–5.1)
Sodium: 136 mEq/L (ref 136–145)

## 2016-05-31 LAB — HEMOGLOBIN A1C
Average Estimated Glucose: 398.2 mg/dL
Average Estimated Glucose: 392.4 mg/dL
Hemoglobin A1C: 15.5 % — ABNORMAL HIGH (ref 4.6–5.9)
Hemoglobin A1C: 15.3 % — ABNORMAL HIGH (ref 4.6–5.9)

## 2016-05-31 LAB — GLUCOSE WHOLE BLOOD - POCT
Whole Blood Glucose POCT: 330 mg/dL — ABNORMAL HIGH (ref 70–100)
Whole Blood Glucose POCT: 465 mg/dL — ABNORMAL HIGH (ref 70–100)
Whole Blood Glucose POCT: 240 mg/dL — ABNORMAL HIGH (ref 70–100)

## 2016-05-31 LAB — TROPONIN I: Troponin I: 0.01 ng/mL (ref 0.00–0.09)

## 2016-05-31 LAB — MAGNESIUM: Magnesium: 1.8 mg/dL (ref 1.6–2.6)

## 2016-05-31 LAB — HEMOLYSIS INDEX
Hemolysis Index: 9 (ref 0–18)
Hemolysis Index: 50 — ABNORMAL HIGH (ref 0–18)

## 2016-05-31 LAB — GFR: EGFR: >60

## 2016-05-31 MED ORDER — TECHNETIUM TC 99M TETROFOSMIN INJECTION
1.0000 | Freq: Once | Status: AC | PRN
Start: 2016-05-31 — End: 2016-05-31
  Administered 2016-05-31: 1 via INTRAVENOUS

## 2016-05-31 MED ORDER — INSULIN LISPRO 100 UNIT/ML SC SOLN
20.0000 [IU] | Freq: Three times a day (TID) | SUBCUTANEOUS | Status: DC
Start: 2016-05-31 — End: 2016-06-01
  Administered 2016-05-31 – 2016-06-01 (×2): 20 [IU] via SUBCUTANEOUS
  Filled 2016-05-31: qty 60

## 2016-05-31 MED ORDER — DEXTROSE 10 % IV BOLUS
125.0000 mL | INTRAVENOUS | Status: DC | PRN
Start: 2016-05-31 — End: 2016-05-31

## 2016-05-31 MED ORDER — INSULIN GLARGINE 100 UNIT/ML SC SOLN
60.0000 [IU] | Freq: Every evening | SUBCUTANEOUS | Status: DC
Start: 2016-05-31 — End: 2016-06-01
  Administered 2016-05-31: 60 [IU] via SUBCUTANEOUS
  Filled 2016-05-31: qty 60

## 2016-05-31 MED ORDER — REGADENOSON 0.4 MG/5ML IV SOLN
INTRAVENOUS | Status: AC
Start: 2016-05-31 — End: 2016-05-31
  Filled 2016-05-31: qty 5

## 2016-05-31 MED ORDER — TECHNETIUM TC 99M TETROFOSMIN INJECTION
1.0000 | Freq: Once | Status: DC | PRN
Start: 2016-05-31 — End: 2016-06-01

## 2016-05-31 MED ORDER — GLUCAGON 1 MG IJ SOLR (WRAP)
1.0000 mg | INTRAMUSCULAR | Status: DC | PRN
Start: 2016-05-31 — End: 2016-05-31

## 2016-05-31 MED ORDER — INSULIN LISPRO 100 UNIT/ML SC SOLN
1.0000 [IU] | Freq: Three times a day (TID) | SUBCUTANEOUS | Status: DC | PRN
Start: 2016-05-31 — End: 2016-06-01
  Administered 2016-05-31: 8 [IU] via SUBCUTANEOUS
  Filled 2016-05-31: qty 60
  Filled 2016-05-31: qty 24

## 2016-05-31 MED ORDER — SODIUM CHLORIDE 0.9 % IV SOLN
INTRAVENOUS | Status: DC
Start: 2016-05-31 — End: 2016-06-01

## 2016-05-31 MED ORDER — INSULIN LISPRO 100 UNIT/ML SC SOLN
1.0000 [IU] | Freq: Every evening | SUBCUTANEOUS | Status: DC | PRN
Start: 2016-05-31 — End: 2016-06-01
  Administered 2016-05-31: 2 [IU] via SUBCUTANEOUS
  Filled 2016-05-31: qty 6

## 2016-05-31 MED ORDER — GLUCOSE 40 % PO GEL
15.0000 g | ORAL | Status: DC | PRN
Start: 2016-05-31 — End: 2016-05-31

## 2016-05-31 NOTE — Plan of Care (Signed)
Problem: Chest Pain  Goal: Vital signs and cardiac rhythm stable  Outcome: Progressing   05/30/16 1544   Goal/Interventions addressed this shift   Vital signs and cardiac rhythm stable Monitor /assess vital signs/cardiac rhythms;Assess the need for oxygen therapy and administer as ordered     Goal: Cardiac pain management  Outcome: Progressing   05/30/16 1544   Goal/Interventions addressed this shift   Cardiac pain management  Assess/report chest pain/or related discomfort to LIP immediately;Instruct patient to report any change in pain status;Assess pain/or related discomfort on admission, during daily assessment, before and after any intervention;Include patient and patient care companion in decisions related to pain management     Goal: Anxiety management/effective coping  Outcome: Progressing   05/30/16 1544   Goal/Interventions addressed this shift   Anxiety management/effective coping  Assess/report uncontrolled anxiety, depression or ineffective coping to LIP;Encourage patient to immediately report any increase in anxiety and/or depression;Include patient in decision making of their care and give updates on their health status     Goal: Patient/Patient Care Companion demonstrates understanding of disease process, treatment plan, medications, and discharge plan  Outcome: Progressing      Problem: Diabetes: Glucose Imbalance  Goal: Blood glucose stable at established goal  Outcome: Progressing      Pt is A&Ox4. Ambulatory pt. Pt denies chest pain or any discomfort. VSS. Stress test (-). Started on IVF.

## 2016-05-31 NOTE — Progress Notes (Signed)
05/31/16         05/31/16 1653   Patient Type   Within 30 Days of Previous Admission? No   Healthcare Decisions   Interviewed: Patient   Orientation/Decision Making Abilities of Patient Alert and Oriented x3, able to make decisions   Advance Directive Patient does not have advance directive   Healthcare Agent Appointed No   Prior to admission   Prior level of function Independent with ADLs;Ambulates independently   Type of Residence Private residence   Have running water, electricity, heat, etc? Yes   Living Arrangements Spouse/significant other   How do you get to your MD appointments? self   How do you get your groceries? self   Who fixes your meals? self   Who does your laundry? self   Who picks up your prescriptions? self   Dressing Independent   Grooming Independent   Feeding Independent   Bathing Independent   Toileting Independent   DME Currently at Home Other (Comment)  (none)   Name of Prior Assisted Living Facility n/a   Home Care/Community Services None   Prior SNF admission? (Detail) no   Prior Rehab admission? (Detail) no   Adult Protective Services (APS) involved? No   Discharge Planning   Support Systems Spouse/significant other   Patient expects to be discharged to: home self-care   Anticipated  plan discussed with: Patient   Mode of transportation: Private car (family member)   Consults/Providers   PT Evaluation Needed 2   OT Evalulation Needed 2   SLP Evaluation Needed 2   Correct PCP listed in Epic? Yes   Important Message from Medicare Notice   Patient received 1st IMM Letter? n/a       Garry Heater, RN MSN  Clinical Care Manager  [phone]

## 2016-05-31 NOTE — Progress Notes (Signed)
[facility]  PROGRESS NOTE      Patient: Adam Bridges  Date: 05/31/2016   LOS: 0 Days  Admission Date: 05/30/2016   MRN: [id number]  Attending: Cathe Mons  Please contact me on the following   Pager [phone]        ASSESSMENT/PLAN     Adam Bridges is a 55 y.o. male admitted with Chest pain in adult    Interval Summary:     1. Chest pain R/O MI :   - EKG showed T wave abnormality   - F/U Nuclear stress test results   - 2D ECHO ( pending)  - Continue telemetry     2. AKI : Improving   - IVF   - May need to Hold Lisinopril and Metformin upon discharge   - BMP daily     3. Uncontrolled DM :   - HbA1C 15.5   - Start Novolog 20 units pre meals ( Home dose)   - Lantus 60 units ( Home dose )   - Insulin corrections     4. HTN : controlled   - Continue Lisinopril   - Continue Toprol   - Cardiac diet     5. H x of CVA :  - Continue Plavix       Analgesia:     Nutrition: Carb consistent and Cardiac     DVT Prophylaxis: Heparin        Code Status: Full code    DISPO:     Family Contact:       SUBJECTIVE     Adam Bridges chest pain has resolved     MEDICATIONS     Current Facility-Administered Medications   Medication Dose Route Frequency   . regadenoson       . amLODIPine  5 mg Oral Daily   . buPROPion XL  150 mg Oral Daily   . clopidogrel  75 mg Oral Daily   . heparin (porcine)  5,000 Units Subcutaneous Q8H SCH   . hydroCHLOROthiazide  25 mg Oral Daily   . insulin glargine  30 Units Subcutaneous QHS   . lisinopril  40 mg Oral Daily   . metoprolol succinate  100 mg Oral Daily   . nortriptyline  25 mg Oral QHS       PHYSICAL EXAM     Vitals:    05/31/16 1304   BP: 120/78   Pulse:    Resp:    Temp: 97.9 F (36.6 C)   SpO2:        Temperature: Temp  Min: 96.4 F (35.8 C)  Max: 97.9 F (36.6 C)  Pulse: Pulse  Min: 71  Max: 76  Respiratory: Resp  Min: 16  Max: 19  Non-Invasive BP: BP  Min: 118/80  Max: 141/88  Pulse Oximetry SpO2  Min: 93 %  Max: 98 %    Intake and Output Summary (Last 24 hours) at Date  Time    Intake/Output Summary (Last 24 hours) at 05/31/16 1323  Last data filed at 05/31/16 0100   Gross per 24 hour   Intake              350 ml   Output              300 ml   Net               50 ml       GEN APPEARANCE: Normal;  A&OX3  HEENT: PERLA; EOMI; Conjunctiva Clear  NECK: Supple; No bruits  CVS: RRR, S1, S2; No M/G/R  LUNGS: CTAB; No Wheezes; No Rhonchi: No rales  ABD: Soft; No TTP; + Normoactive BS  EXT: No edema; Pulses 2+ and intact  Skin exam:  no pallor  NEURO: CN 2-12 intact; No Focal neurological deficits  CAP REFILL:  Normal  MENTAL STATUS:  Normal    Exam done by Cathe Mons, MD on 05/31/16 at 1:23 PM      LABS       Recent Labs  Lab 05/31/16  0436 05/30/16  1112   WBC 5.25 5.32   RBC 4.30* 4.57*   Hgb 13.3 14.1   Hematocrit 37.9* 39.5*   MCV 88.1 86.4   Platelets 279 300         Recent Labs  Lab 05/31/16  0436 05/30/16  1113   Sodium 136 137   Potassium 3.4* 3.6   Chloride 99* 97*   CO2 24 26   BUN 20.0 22.0   Creatinine 1.4* 1.6*   Glucose 343* 396*   Calcium 8.6 9.3   Magnesium 1.8  --          Recent Labs  Lab 05/30/16  1113   ALT 26   AST (SGOT) 15   Bilirubin, Total 0.6   Albumin 3.0*   Alkaline Phosphatase 69         Recent Labs  Lab 05/31/16  0436 05/30/16  1801 05/30/16  1113   Troponin I 0.01 0.02 0.01             Microbiology Results     None           RADIOLOGY     Upon my review:    Brendolyn Patty  1:23 PM 05/31/2016

## 2016-05-31 NOTE — UM Notes (Signed)
05/30/2016 @ [address] (admit) for Observation Services (Order [id number])   Diagnosis Chest pain in adult    Estimated Length of Stay < 2 midnights    Tentative Discharge Plan? Home or Self Care    Patient Class Observation      FEP BCBS      55 y.o. male with a PMHx of HTN, DM, TIA  who presented with chest pain that started this morning while he was at work.  He stated that his chest pain felt like dullness in the chest associated with headache and difficulty breathing.  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 In the ER his workup was negative except for new change EKG T wave inversion.    BP 116/72   Pulse 71   Temp 98 F (36.7 C) (Oral)   Resp 16   SpO2 96%     Glucose 396  Creat 1.6  Ch 97  Albumin 3.0  Chest x-ray: trace left pleural effusion    EKG: NSR with T waves inversion in V4-V6, I,II and aVL and aVF     Iv ns and insulin were given in the ED.     PLAN:  R/O ACS   D dimer negative ruling out PE     Serial cardiac enzymes   ChecK echocardiogram   Tele monitor   Cardiac consult   Continue plavix   Pain control   Continue home HTN medications  check HbA1C    POCT FS with lispro SS    hold metformin   lantus decrease to 30 units at bedtime since he will be NPO after Midnight   Continue Plavix   Cardiac carb consistent diet   For DVT: heparin    Patient advised that he is on ARBs and ACEi and should stop one of them and he stated that he will discuss with PCP     Scheduled Meds:  Current Facility-Administered Medications   Medication Dose Route Frequency   . amLODIPine  5 mg Oral Daily   . buPROPion XL  150 mg Oral Daily   . clopidogrel  75 mg Oral Daily   . heparin (porcine)  5,000 Units Subcutaneous Q8H SCH   . hydroCHLOROthiazide  25 mg Oral Daily   . insulin glargine  30 Units Subcutaneous QHS   . lisinopril  40 mg Oral Daily   . metoprolol succinate  100 mg Oral Daily   . nortriptyline  25 mg Oral QHS     :   PRN Meds:.acetaminophen, Nursing communication: Adult  Hypoglycemia Treatment Algorithm **AND** dextrose **AND** dextrose **AND** glucagon (rDNA), insulin lispro, insulin lispro, naloxone      Elfredia Nevins, RN, BSN  Utilization Review Case Manager  Case Management  [facility]   [address]  [place], [place]  [address]  T [phone]  Judie Petit [phone] F [phone]

## 2016-05-31 NOTE — Progress Notes (Addendum)
[place] [facility]  PROGRESS NOTE  [facility]     Date Time: 05/31/16 8:31 AM  Patient Name: Adam Bridges     Patient Active Problem List   Diagnosis   . Chest pain in adult   . HTN (hypertension)   . DM type 2 (diabetes mellitus, type 2)     Assessment:    Patient admitted 05/30/2016 with chest pain.  Negative troponin levels X 3.   HTN.  Controlled.     Prior TIA.   RI, Cr 1.4.  Down from 1.6.     DM, on insulin.   OSA, BiPAP.   Chronic back pain with overall limited activity level.      Recommendations:    Lipid panel and HbA1c (ordered).   Echocardiogram pending.   Lexiscan nuclear study today (ordered).    Subjective:   Denies chest pain, SOB or palpitations.    Medications:      Scheduled Meds: PRN Meds:      amLODIPine 5 mg Oral Daily   buPROPion XL 150 mg Oral Daily   clopidogrel 75 mg Oral Daily   heparin (porcine) 5,000 Units Subcutaneous Q8H SCH   hydroCHLOROthiazide 25 mg Oral Daily   insulin glargine 30 Units Subcutaneous QHS   lisinopril 40 mg Oral Daily   metoprolol succinate 100 mg Oral Daily   nortriptyline 25 mg Oral QHS       Continuous Infusions:      acetaminophen 650 mg Q6H PRN   dextrose 15 g of glucose PRN   And     dextrose 125 mL PRN   And     glucagon (rDNA) 1 mg PRN   insulin lispro 1-3 Units QHS PRN   insulin lispro 2-10 Units TID AC PRN   naloxone 0.2 mg PRN         Home Meds:   Prescriptions Prior to Admission   Medication Sig Dispense Refill Last Dose   . amLODIPine (NORVASC) 5 MG tablet Take 5 mg by mouth daily.      . Azilsartan-Chlorthalidone (EDARBYCLOR) 40-25 MG Tab Take 1 tablet by mouth daily.      Marland Kitchen buPROPion XL (WELLBUTRIN XL) 150 MG 24 hr tablet Take 150 mg by mouth daily.      . clopidogrel (PLAVIX) 75 mg tablet Take 75 mg by mouth daily.      . insulin aspart (NOVOLOG) 100 UNIT/ML injection Inject into the skin 3 (three) times daily before meals.Per sliding scale          . insulin detemir (LEVEMIR) 100 UNIT/ML injection Inject 60 Units into the skin  nightly.Per sliding scale          . lisinopril (PRINIVIL,ZESTRIL) 40 MG tablet Take 40 mg by mouth daily.      . metFORMIN (FORTAMET) 500 MG (OSM) 24 hr tablet Take 1,000 mg by mouth 2 (two) times daily with meals.          . metoprolol succinate (TOPROL-XL) 100 MG 24 hr tablet Take 100 mg by mouth daily.      . nortriptyline (PAMELOR) 25 MG capsule Take 25 mg by mouth nightly.             Physical Exam:   BP 141/88   Pulse 76   Temp 97 F (36.1 C) (Oral)   Resp 18   Ht 1.753 m (5' 9.02")   Wt 97.9 kg (215 lb 14.4 oz)   SpO2 98%   BMI 31.87 kg/m  O2  Flow Rate (L/min): 2 L/min O2 Device: Nasal cannula    Temp (24hrs), Avg:97.5 F (36.4 C), Min:96.4 F (35.8 C), Max:98 F (36.7 C)      Telemetry reviewed - NSR     Intake and Output Summary (Last 24 hours) at Date Time    Intake/Output Summary (Last 24 hours) at 05/31/16 0831  Last data filed at 05/31/16 0100   Gross per 24 hour   Intake              350 ml   Output              300 ml   Net               50 ml       General Appearance:  Breathing comfortable, no acute distress  Head:  normocephalic  Eyes:  EOM's intact  Neck:  No carotid bruit or jugular venous distension  Lungs:  Clear to auscultation without wheezes, rhonchi or rales  Chest Wall:  No tenderness or deformity  Heart:  S1 S2 normal No S3 or S4, without murmur/rub.  PMI non displaced   Abdomen:  Soft, non-tender, positive bowel sounds  Extremities:  No edema  Pulses:  2+ pedal, radial, brachial pulses equal bilateraly  Neurologic:  Alert and oriented x3, mood and affect normal  Musculoskeletal: normal strength and tone    Labs:     Recent Labs  Lab 05/31/16  0436 05/30/16  1801 05/30/16  1113   Troponin I 0.01 0.02 0.01       Recent Labs  Lab 05/30/16  1113   Bilirubin, Total 0.6   Protein, Total 6.5   Albumin 3.0*   ALT 26   AST (SGOT) 15       Recent Labs  Lab 05/31/16  0436   Magnesium 1.8           Recent Labs  Lab 05/31/16  0436 05/30/16  1112   WBC 5.25 5.32   Hgb 13.3 14.1    Hematocrit 37.9* 39.5*   Platelets 279 300       Recent Labs  Lab 05/31/16  0436 05/30/16  1113   Sodium 136 137   Potassium 3.4* 3.6   Chloride 99* 97*   CO2 24 26   BUN 20.0 22.0   Creatinine 1.4* 1.6*   EGFR >60.0 45.4   Glucose 343* 396*   Calcium 8.6 9.3       No results found for: BNP    Weight Monitoring 05/30/2016 05/31/2016   Height 175.3 cm 175.3 cm   Height Method Stated -   Weight 99.338 kg 97.932 kg   Weight Method Bed Scale Bed Scale   BMI (calculated) 32.4 kg/m2 31.9 kg/m2         Imaging:   Radiological Procedure            Signed by: Adam Lucks, NP     Seen and examined.  Our assessment and plan as above.  Adam Starch, MD.       [facility]  NP Spectralink [phone] (8am-5pm)  MD Spectralink [phone] (8am-5pm)  After hours, non urgent consult line [phone]  After Hours, urgent consults [phone]

## 2016-06-01 LAB — BASIC METABOLIC PANEL
Anion Gap: 11 (ref 5.0–15.0)
BUN: 15 mg/dL (ref 9.0–28.0)
CO2: 26 mEq/L (ref 22–29)
Calcium: 8.6 mg/dL (ref 8.5–10.5)
Chloride: 104 mEq/L (ref 100–111)
Creatinine: 1.1 mg/dL (ref 0.7–1.3)
Glucose: 144 mg/dL — ABNORMAL HIGH (ref 70–100)
Potassium: 3.4 mEq/L — ABNORMAL LOW (ref 3.5–5.1)
Sodium: 141 mEq/L (ref 136–145)

## 2016-06-01 LAB — HEMOLYSIS INDEX: Hemolysis Index: 3 (ref 0–18)

## 2016-06-01 LAB — GFR: EGFR: >60

## 2016-06-01 LAB — GLUCOSE WHOLE BLOOD - POCT: Whole Blood Glucose POCT: 144 mg/dL — ABNORMAL HIGH (ref 70–100)

## 2016-06-01 MED ORDER — AMLODIPINE BESYLATE 10 MG PO TABS
10.0000 mg | ORAL_TABLET | Freq: Every day | ORAL | 0 refills | Status: AC
Start: 2016-06-01 — End: ?

## 2016-06-01 MED ORDER — POTASSIUM CHLORIDE CRYS ER 20 MEQ PO TBCR
40.0000 meq | EXTENDED_RELEASE_TABLET | Freq: Once | ORAL | Status: AC
Start: 2016-06-01 — End: 2016-06-01
  Administered 2016-06-01: 40 meq via ORAL
  Filled 2016-06-01: qty 2

## 2016-06-01 NOTE — Discharge Instr - AVS First Page (Signed)
Reason for your Hospital Admission:  A typical Chest pain       Instructions for after your discharge:  F/U with Endocrine for better DM control

## 2016-06-01 NOTE — Progress Notes (Signed)
Pt is ready to be discharged awaiting for his wife to pick him up. IV line and telemetry monitor removed. No bleeding noted from iv site. Dry gauzes applied over iv site. VSS. Discharge instructions and one prescriptions given. Encourage to ask questions and all concerns are addressed. Pt has no further inquiries. Verballized understanding of discharge instructions. Marland Kitchen

## 2016-06-01 NOTE — Progress Notes (Signed)
Pt's wife unable to transport. Wheelchair van scheduled for 12 pm; Zenaida Niece will transport pt to pt's work place: [address].     Garry Heater, RN MSN  Clinical Care Manager  [phone]

## 2016-06-01 NOTE — Plan of Care (Signed)
Problem: Pain  Goal: Pain at adequate level as identified by patient  Outcome: Progressing   06/01/16 0459   Goal/Interventions addressed this shift   Pain at adequate level as identified by patient Identify patient comfort function goal;Assess for risk of opioid induced respiratory depression, including snoring/sleep apnea. Alert healthcare team of risk factors identified.;Assess pain on admission, during daily assessment and/or before any "as needed" intervention(s);Reassess pain within 30-60 minutes of any procedure/intervention, per Pain Assessment, Intervention, Reassessment (AIR) Cycle;Evaluate patient's satisfaction with pain management progress;Evaluate if patient comfort function goal is met;Offer non-pharmacological pain management interventions;Consult/collaborate with Pain Service       Problem: Fluid and Electrolyte Imbalance/ Endocrine  Goal: Fluid and electrolyte balance are achieved/maintained  Outcome: Progressing   06/01/16 0459   Goal/Interventions addressed this shift   Fluid and electrolyte balance are achieved/maintained Monitor intake and output every shift;Monitor/assess lab values and report abnormal values;Provide adequate hydration;Assess for confusion/personality changes;Observe for seizure activity and initiate seizure precautions if indicated;Monitor daily weight;Assess and reassess fluid and electrolyte status;Observe for cardiac arrhythmias     Goal: Adequate hydration  Outcome: Progressing   06/01/16 0459   Goal/Interventions addressed this shift   Adequate hydration Assess mucus membranes, skin color, turgor, perfusion and presence of edema;Assess for peripheral, sacral, periorbital and abdominal edema;Monitor and assess vital signs and perfusion       Comments: Adam Bridges's learning abilities have been assessed.  Today's individualized plan of care to continue hourly rounding, pain management, 24H telemetry monitoring, Lab, DM management , IV fluid therapy and safety was  discussed with Adam Bridges and he agreed to it.  Adam Bridges demonstrates understanding of disease process, treatment plan, medications, and consequences of noncompliance.  All question and concerns were addressed.

## 2016-06-01 NOTE — UM Notes (Signed)
05/31/2016 @ 1322  Admit to Inpatient (Order [id number])   Diagnosis Chest pain in adult    Estimated Length of Stay 3 - 5 Days    Tentative Discharge Plan? Home or Self Care    Patient Class Inpatient      FEP BCBS    Diagnosis  . Chest pain in adult  . HTN (hypertension)  . DM type 2 (diabetes mellitus, type 2)    Assessment:   Patient admitted 05/30/2016 with chest pain.  Negative troponin levels X 3.   HTN.  Controlled.     Prior TIA.   RI, Cr 1.4.  Down from 1.6.     DM, on insulin.   OSA, BiPAP.   Chronic back pain with overall limited activity level.      PLAN:   Lipid panel and HbA1c (ordered).   Echocardiogram pending.   Lexiscan nuclear study today (ordered).       Scheduled Meds: PRN Meds:      amLODIPine 5 mg Oral Daily   buPROPion XL 150 mg Oral Daily   clopidogrel 75 mg Oral Daily   heparin (porcine) 5,000 Units Subcutaneous Q8H SCH   hydroCHLOROthiazide 25 mg Oral Daily   insulin glargine 30 Units Subcutaneous QHS   lisinopril 40 mg Oral Daily   metoprolol succinate 100 mg Oral Daily   nortriptyline 25 mg Oral QHS             acetaminophen 650 mg Q6H PRN   dextrose 15 g of glucose PRN   And     dextrose 125 mL PRN   And     glucagon (rDNA) 1 mg PRN   insulin lispro 1-3 Units QHS PRN   insulin lispro 2-10 Units TID AC PRN   naloxone 0.2 mg PRN           BP 141/88   Pulse 76   Temp 97 F (36.1 C) (Oral)   Resp 18   Ht 1.753 m (5' 9.02")   Wt 97.9 kg (215 lb 14.4 oz)   SpO2 98%   BMI 31.87 kg/m  O2 Flow Rate (L/min): 2 L/min O2 Device: Nasal cannula    K 3.4  Ch 99  Creat 1.4  Glucose 343  A1C 15.5  Cholesterol 211  HDL 39  LDL 145      Later this day:      Cardiac stress test: Lexiscan stress test negative for ischemia    06/01/2016      NM myocardial perfusion:    IMPRESSIONS:  Mild concentric left ventricular hypertrophy with normal left ventricular  systolic function  Grade I diastolic dysfunction  No significant valvular abnormalities The echocardiographic study was  abnormal.  No intracardiac mass, thrombus or vegetation was noted.    FINDINGS:    LEFT VENTRICLE: Size was normal. Systolic function was normal. Ejection  fraction was estimated in the range of 55 % to 65 %. There were no regional  wall motion abnormalities. Wall thickness was increased. There was mild  concentric hypertrophy. Doppler: Doppler parameters were consistent with  abnormal left ventricular relaxation (grade 1 diastolic dysfunction).    VENTRICULAR SEPTUM: No ventricular shunt was identified.    RIGHT VENTRICLE: The size was normal. Systolic function was normal. Wall  thickness was normal.    LEFT ATRIUM: Size was normal.    ATRIAL SEPTUM: There was no left-to-right shunt and no right-to-left shunt.    RIGHT ATRIUM: Size was normal.    AORTIC VALVE:  The valve was trileaflet. Leaflets exhibited normal thickness and  normal cuspal separation. Doppler: Transaortic velocity was within the normal  range. There was no stenosis. There was no regurgitation.    MITRAL VALVE: Valve structure was normal. There was normal leaflet separation.  No echocardiographic evidence for prolapse. Doppler: The transmitral velocity  was within the normal range. There was no evidence for stenosis. There was no  regurgitation.    TRICUSPID VALVE: The valve structure was normal. There was normal leaflet  separation. Doppler: The transtricuspid velocity was within the normal range.  There was no evidence for tricuspid stenosis. There was trivial regurgitation.    PULMONIC VALVE: Leaflets exhibited normal thickness, no calcification, and  normal cuspal separation. Doppler: The transpulmonic velocity was within the  normal range. There was no regurgitation.    AORTA: The root exhibited normal size.    PULMONARY ARTERY: The size was normal. Doppler: Systolic pressure was within  the normal range.    SYSTEMIC VEINS: IVC: The inferior vena cava was normal in size.    PERICARDIUM: There was no pericardial  effusion.    SYSTEM MEASUREMENT TABLES    2D  LVEDV MOD A4C: 56.3 ml  LVEF MOD A4C: 67.8 %  LVESV MOD A4C: 18.1 ml  LVLd A4C: 7.7 cm  LVLs A4C: 5.6 cm  RVIDd: 3.6 cm  IVC: 14.2 mm  SV MOD A4C: 38.1 ml    CW  AV Vmax: 1.2 m/s  PV Vmax: 0.9 m/s  RAP: 3 mmHg  TR Vmax: 2.2 m/s  TR maxPG: 18.5 mmHg  TV E Vel: 0.6 m/s    MM  Ao Diam: 3.3 cm  LA Diam: 3.3 cm  AV Cusp: 2.3 cm  %FS: 37 %  EDV(Teich): 96.4 ml  EF(Teich): 67 %  ESV(Teich): 31.9 ml  IVSd: 1.2 cm  LVIDd: 4.6 cm  LVIDs: 2.9 cm  LVPWd: 1.2 cm  SV(Teich): 64.6 ml    PW  LVOT Vmax: 0.9 m/s  MV A Vel: 0.6 m/s  MV Dec Slope: 2.8 m/s2  MV DecT: 221.7 ms  MV E Vel: 0.6 m/s  MV E/A Ratio: 1  MV PHT: 64.3 ms  MVA By PHT: 3.4 cm2  RVSP: 21.5 mmHg    Elfredia Nevins, RN, BSN  Utilization Review Case Manager  Case Management  [facility]   [address]  [place], [place]  [address]  T [phone]  Judie Petit [phone] F [phone]

## 2016-06-01 NOTE — Discharge Summary (Signed)
[facility]      Patient: Adam Bridges  Admission Date: 05/30/2016   DOB: 01/31/1961  Discharge Date: 06/01/2016    MRN: [id number]  Discharge Attending:Jazminn Pomales Waymon Budge     Referring Physician: Lum Keas, MD  PCP: Lum Keas, MD       DISCHARGE SUMMARY     Discharge Information   Admission Diagnosis:   Chest pain in adult    Discharge Diagnosis:   Atypical chest pain   Uncontrolled DM   Active Hospital Problems    Diagnosis   . Chest pain in adult   . HTN (hypertension)   . DM type 2 (diabetes mellitus, type 2)        Admission Condition: Unstable   Discharge Condition: Stable   Consultants: Cardiology   Functional Status: Independent   Discharged to: Home     Discharge Medications:     Medication List      CHANGE how you take these medications    amLODIPine 10 MG tablet  Commonly known as:  NORVASC  Take 1 tablet (10 mg total) by mouth daily.  What changed:   medication strength   how much to take        CONTINUE taking these medications    buPROPion XL 150 MG 24 hr tablet  Commonly known as:  WELLBUTRIN XL     clopidogrel 75 mg tablet  Commonly known as:  PLAVIX     EDARBYCLOR 40-25 MG Tabs  Generic drug:  Azilsartan-Chlorthalidone     insulin aspart 100 UNIT/ML injection  Commonly known as:  NovoLOG     insulin detemir 100 UNIT/ML injection  Commonly known as:  LEVEMIR     lisinopril 40 MG tablet  Commonly known as:  PRINIVIL,ZESTRIL     metFORMIN 500 MG (OSM) 24 hr tablet  Commonly known as:  FORTAMET     metoprolol succinate 100 MG 24 hr tablet  Commonly known as:  TOPROL-XL     nortriptyline 25 MG capsule  Commonly known as:  PAMELOR           Where to Get Your Medications      You can get these medications from any pharmacy    Bring a paper prescription for each of these medications   amLODIPine 10 MG tablet             Hospital Course   Presentation History   Adam Bridges is a 55 y.o. 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     See HPI for details.    Hospital Course (1 Days)   Adam Bridges is a 55 Yo male presenting with chest pain , EKG showed T wave abnormality , 3 sets of Trop were negative , Nuclear stress test was done and was negative . He was also found to have AKI which has resolved with IVF . Hi sDM was found to be uncontrolled despite being on a very high dose of Insulin , Patient was instructed to F/U with Endocrine for better DM control .  Procedures/Imaging:   Upon my review: Nuclear stress test        Best Practices   Was the patient admitted with either a CHF Exacerbation or Pneumonia? No      Progress Note/Physical Exam at Discharge     Subjective: Denied CP , SOB or dizziness     Vitals:    05/31/16 1941 05/31/16 2336 06/01/16 0500 06/01/16 0727   BP: 141/78 121/73 117/67 119/82   Pulse: 70 69  73   Resp: 16 16 18 18    Temp: 97.4 F (36.3 C) 97.1 F (36.2 C) 97.8 F (36.6 C) 98 F (36.7 C)   TempSrc: Oral Oral Oral Oral   SpO2: 96% 96% 97% 97%   Weight:   98.1 kg (216 lb 4.3 oz)    Height:   1.753 m (5' 9.02")            General: NAD, AAOx3  HEENT: perrla, eomi, sclera anicteric, OP: Clear, MMM  Neck: supple, FROM, no LAD  Cardiovascular: RRR, no m/r/g  Lungs: CTAB, no w/r/r  Abdomen: soft, +BS, NT/ND, no masses, no g/r  Extremities: no C/C/E  Skin: no rashes or lesions noted  Neuro: CN 2-12 intact; No Focal neurological deficits       Diagnostics     Labs/Studies Pending at Discharge: No    Last Labs     Recent Labs  Lab 05/31/16  0436 05/30/16  1112   WBC 5.25 5.32   RBC 4.30* 4.57*   Hgb 13.3 14.1   Hematocrit 37.9* 39.5*   MCV 88.1 86.4   Platelets 279 300         Recent Labs  Lab 06/01/16  0526  05/31/16  0436 05/30/16  1113   Sodium 141 136 137   Potassium 3.4* 3.4* 3.6   Chloride 104 99* 97*   CO2 26 24 26    BUN 15.0 20.0 22.0   Creatinine 1.1 1.4* 1.6*   Glucose 144* 343* 396*   Calcium 8.6 8.6 9.3   Magnesium  --  1.8  --        Microbiology Results     None           Patient Instructions   Discharge Diet: Cardiac and carb consistent   Discharge Activity: as tolerated     Follow Up Appointment:  Follow-up Information     Lum Keas, MD .    Specialty:  Pediatrics  Contact information:  [address]  [place] [address]  [phone]             Endocrine Follow up in 1 week(s).                  Time spent examining patient, discussing with patient/family regarding hospital course, chart review, reconciling medications and discharge planning: 45  minutes.    Brendolyn Patty    9:27 AM 06/01/2016

## 2016-06-01 NOTE — UM Notes (Signed)
06/01/2016  Patient is discharged today.       Elfredia Nevins, RN, BSN  Utilization Review Case Manager  Case Management  [facility]   [address]  [place], [place]  [address]  T [phone]  Judie Petit [phone] Carmon Ginsberg [phone]

## 2016-06-01 NOTE — Plan of Care (Signed)
AAOx4. VS stable. NSR and Room air. Ambulates with steady gait. Pt kep clean, dry, and comfortable. Safety maintained. Pt is ready for discharge.

## 2016-06-01 NOTE — Progress Notes (Signed)
D/c home self-care. No CM needs.    Garry Heater, RN MSN  Clinical Care Manager  [phone]       06/01/16 1023   Discharge Disposition   Patient preference/choice provided? N/A   Physical Discharge Disposition Home, No Needs   Mode of Transportation Car   Pick up time 1100   Patient agreeable to discharge plan/expected d/c date? Yes   Bedside nurse notified of transport plan? Yes   CM Interventions   Follow up appointment scheduled? No   Reason no follow up scheduled? Other (comment)  (pt to schedule)   Referral made for home health RN visit? Does not meet home bound criteria   Multidisciplinary rounds/family meeting before d/c? No   Medicare Checklist   Is this a Medicare patient? No

## 2016-06-01 NOTE — Progress Notes (Signed)
[facility]  PROGRESS NOTE  [facility]     Date Time: 06/01/16 9:08 AM  Patient Name: Adam Bridges,Adam Bridges     Patient Active Problem List   Diagnosis   . Chest pain in adult   . HTN (hypertension)   . DM type 2 (diabetes mellitus, type 2)     Assessment:    Patient admitted 05/30/2016 with chest pain.   Negative troponins X 3   Lexiscan 04/30/2016 revealing normal myocardial perfusion without evidence of ischemia, LVEF 51%   HTN, controlled with current medication regimen   Prior TIA, on Plavix   RI, improved with Cr of 1.1   DM, on insulin   OSA, Bi-PAP   Chronic back pain with overall limited activity level    Recommendations:      Pending final read of echocardiogram, patient is considered stable for discharge from Bridges cardiology standpoint.    Follow-up with primary cardiologist (patient can't remember name) in [place], [place] for ongoing cardiac risk factor modification and evaluation.    Subjective:   Denies chest pain, SOB or palpitations.    Medications:      Scheduled Meds: PRN Meds:      amLODIPine 5 mg Oral Daily   buPROPion XL 150 mg Oral Daily   clopidogrel 75 mg Oral Daily   heparin (porcine) 5,000 Units Subcutaneous Q8H SCH   hydroCHLOROthiazide 25 mg Oral Daily   insulin glargine 60 Units Subcutaneous QHS   insulin lispro 20 Units Subcutaneous TID AC   lisinopril 40 mg Oral Daily   metoprolol succinate 100 mg Oral Daily   nortriptyline 25 mg Oral QHS       Continuous Infusions:  . sodium chloride 100 mL/hr at 05/31/16 1500      acetaminophen 650 mg Q6H PRN   dextrose 15 g of glucose PRN   And     dextrose 125 mL PRN   And     glucagon (rDNA) 1 mg PRN   insulin lispro 1-4 Units QHS PRN   insulin lispro 1-8 Units TID AC PRN   naloxone 0.2 mg PRN   Tc20m tetrofosmin 1 each ONCE PRN         Home Meds:   Prescriptions Prior to Admission   Medication Sig Dispense Refill Last Dose   . amLODIPine (NORVASC) 5 MG tablet Take 5 mg by mouth daily.      . Azilsartan-Chlorthalidone (EDARBYCLOR) 40-25  MG Tab Take 1 tablet by mouth daily.      Marland Kitchen buPROPion XL (WELLBUTRIN XL) 150 MG 24 hr tablet Take 150 mg by mouth daily.      . clopidogrel (PLAVIX) 75 mg tablet Take 75 mg by mouth daily.      . insulin aspart (NOVOLOG) 100 UNIT/ML injection Inject into the skin 3 (three) times daily before meals.Per sliding scale          . insulin detemir (LEVEMIR) 100 UNIT/ML injection Inject 60 Units into the skin nightly.Per sliding scale          . lisinopril (PRINIVIL,ZESTRIL) 40 MG tablet Take 40 mg by mouth daily.      . metFORMIN (FORTAMET) 500 MG (OSM) 24 hr tablet Take 1,000 mg by mouth 2 (two) times daily with meals.          . metoprolol succinate (TOPROL-XL) 100 MG 24 hr tablet Take 100 mg by mouth daily.      . nortriptyline (PAMELOR) 25 MG capsule Take 25 mg by mouth nightly.  Physical Exam:   BP 119/82   Pulse 73   Temp 98 F (36.7 C) (Oral)   Resp 18   Ht 1.753 m (5' 9.02")   Wt 98.1 kg (216 lb 4.3 oz)   SpO2 97%   BMI 31.92 kg/m  O2 Flow Rate (L/min): 2 L/min O2 Device: None (Room air)    Temp (24hrs), Avg:97.5 F (36.4 C), Min:96.7 F (35.9 C), Max:98 F (36.7 C)      Telemetry reviewed - NSR     Intake and Output Summary (Last 24 hours) at Date Time    Intake/Output Summary (Last 24 hours) at 06/01/16 0908  Last data filed at 05/31/16 2336   Gross per 24 hour   Intake              600 ml   Output             1500 ml   Net             -900 ml       General Appearance: no acute distress  Head:  normocephalic  Eyes:  EOM's intact  Neck:  No carotid bruit or jugular venous distension  Lungs:  Clear to auscultation without wheezes, rhonchi or rales  Chest Wall:  No tenderness or deformity  Heart:  S1 S2 normal No S3 or S4, without murmur/rub.  PMI non displaced   Abdomen:  Soft, non-tender, positive bowel sounds, no hepatojugular reflux  Extremities:  No edema  Pulses:  2+ pedal, radial, brachial pulses equal bilateraly  Neurologic:  Alert and oriented x3, mood and affect  normal  Musculoskeletal: normal strength and tone    Labs:     Recent Labs  Lab 05/31/16  0436 05/30/16  1801 05/30/16  1113   Troponin I 0.01 0.02 0.01       Recent Labs  Lab 05/30/16  1113   Bilirubin, Total 0.6   Protein, Total 6.5   Albumin 3.0*   ALT 26   AST (SGOT) 15       Recent Labs  Lab 05/31/16  0436   Magnesium 1.8           Recent Labs  Lab 05/31/16  0436 05/30/16  1112   WBC 5.25 5.32   Hgb 13.3 14.1   Hematocrit 37.9* 39.5*   Platelets 279 300       Recent Labs  Lab 06/01/16  0526 05/31/16  0436 05/30/16  1113   Sodium 141 136 137   Potassium 3.4* 3.4* 3.6   Chloride 104 99* 97*   CO2 26 24 26    BUN 15.0 20.0 22.0   Creatinine 1.1 1.4* 1.6*   EGFR >60.0 >60.0 45.4   Glucose 144* 343* 396*   Calcium 8.6 8.6 9.3       No results found for: BNP    Weight Monitoring 05/30/2016 05/31/2016 06/01/2016   Height 175.3 cm 175.3 cm 175.3 cm   Height Method Stated - -   Weight 99.338 kg 97.932 kg 98.1 kg   Weight Method Bed Scale Bed Scale Bed Scale   BMI (calculated) 32.4 kg/m2 31.9 kg/m2 32 kg/m2         Imaging:     LEXISCAN MYOVIEW PERFUSION STUDY:  INDICATION: Chest pain  PROCEDURE: The patient was injected with 9.17mCi of Myoview and SPECT  imaging was begun approximately 30 minutes post injection. The camera  obtained images with Bridges 180-degree orbit around the patient's heart.  After  the rest images, the patient was then prepped for Bridges regadenoson  pharmacologic stress test. The patient was given 5 ml (0.4mg ) of IV  regadenoson (Lexiscan) over Bridges ten second rapid infusion with Bridges five to  ten ml normal saline flush given immediately after the infusion. The  patient was then given an intravenous injection of 31.4 mCi of Myoview  followed by another five to ten ml saline flush. The patient was  monitored for approximately 5 minutes or until the patients EKG, BP, and  symptoms came back to baseline. Gated SPECT imaging was started 45  minutes post injection. Gated SPECT imaging was started at least 30  minutes post  injection. The patient was then placed in the supine  position and the images were obtained with Bridges 180-degree orbit around the  patient's heart while gating, when feasible, to the patient's ECG. The  study was then processed and displayed for evaluation.    Please see separate stress report for details.    INTERPRETATION: Images were reviewed in the horizontal long axis,  vertical long axis, and short axis. Review of [facility] quantification  data and polar plots was also performed during this review.     The overall quality of the study is good. The left ventricular cavity is  noted to be normal. Review of rest and stress images reveal uniform  tracer uptake throughout the myocardium. Gated SPECT imaging reveals no  regional wall motion   abnormalities and globally preserved left  ventricular function EF 51%      IMPRESSION:     1. Normal myocardial perfusion no evidence of ischemia  2. Normal left ventricular function 51%    Rogelio Seen M.D. [facility]       Rogelio Seen, MD   05/31/2016 2:34 PM        Signed by: Jenel Lucks, NP      [facility]  NP Spectral[facility] [phone] (8am-5pm)  MD [employer] [phone] (8am-5pm)  After hours, non urgent consult line [phone]  After Hours, urgent consults [phone]

## 2017-05-26 ENCOUNTER — Ambulatory Visit (HOSPITAL_COMMUNITY)
Admission: EM | Admit: 2017-05-26 | Discharge: 2017-05-26 | Disposition: A | Discharge status: Home / Self Care | Payer: Federal, State, Local not specified - PPO | Attending: Internal Medicine | Admitting: Internal Medicine

## 2017-05-26 ENCOUNTER — Ambulatory Visit (INDEPENDENT_AMBULATORY_CARE_PROVIDER_SITE_OTHER): Payer: Federal, State, Local not specified - PPO

## 2017-05-26 ENCOUNTER — Encounter (HOSPITAL_COMMUNITY): Payer: Self-pay | Admitting: Emergency Medicine

## 2017-05-26 DIAGNOSIS — M79671 Pain in right foot: Secondary | ICD-10-CM

## 2017-05-26 DIAGNOSIS — M25571 Pain in right ankle and joints of right foot: Secondary | ICD-10-CM | POA: Diagnosis not present

## 2017-05-26 NOTE — ED Triage Notes (Signed)
Pt reports he inj his right foot/ankle today while playing golf  Sx include mild swelling and pain .... Reports as he was swinging, he heard a crack  BP today = 181/100.... Denies HA, diaphoresis, CP, SOB, n/v, blurred vision.   A&o x4... NAD... Ambulatory

## 2017-05-26 NOTE — Discharge Instructions (Signed)
Xray negative for fracture/dislocation. You can take ibuprofen 600-800mg  three times a day for pain and inflammation. Ice compress with elevation. Ace wrap for during activity. This can take up to 3-4 weeks to completely resolve, but you should be feeling better each week. Follow up with PCP for further evaluation if symptoms not improving.

## 2017-05-26 NOTE — ED Provider Notes (Signed)
[place]    CSN: [id number] Arrival date & time: 05/26/17  1958     History   Chief Complaint Chief Complaint  Patient presents with  . Ankle Injury    HPI Keith Snow is a 56 y.o. male.   56 year old male with history of history of HTN, DM, HLD comes in for 1 day history of ankle pain. Patient states he was playing golf and during his swing, he last his balance and injured the ankle. He does not recall if he inverted or everted ankle, but has barely been able to bear weight. He states he took ibuprofen 400mg  without any improvement. States pain without weight bearing as well. He endorses some mild swelling of the ankle. He states some numbness/tingling, though he has a history of diabetic neuropathy.       History reviewed. No pertinent past medical history.  There are no active problems to display for this patient.   History reviewed. No pertinent surgical history.     Home Medications    Prior to Admission medications   Medication Sig Start Date End Date Taking? Authorizing Provider  hydrochlorothiazide (HYDRODIURIL) 25 MG tablet Take 25 mg by mouth daily.   Yes [provider]  insulin aspart (NOVOLOG) 100 UNIT/ML injection Inject into the skin 3 (three) times daily before meals.   Yes [provider]  insulin detemir (LEVEMIR) 100 UNIT/ML injection Inject into the skin at bedtime.   Yes [provider]  lisinopril (PRINIVIL,ZESTRIL) 20 MG tablet Take 20 mg by mouth daily.   Yes [provider]  metFORMIN (GLUCOPHAGE) 500 MG tablet Take by mouth 2 (two) times daily with a meal.   Yes [provider]  rosuvastatin (CRESTOR) 20 MG tablet Take 20 mg by mouth daily.   Yes [provider]    Family History History reviewed. No pertinent family history.  Social History Social History  Substance Use Topics  . Smoking status: Current Every Day Smoker    Types: Cigars  . Smokeless tobacco: Never  Used  . Alcohol use Yes     Allergies   Patient has no known allergies.   Review of Systems Review of Systems  Reason unable to perform ROS: See HPI as above.     Physical Exam Triage Vital Signs ED Triage Vitals  Enc Vitals Group     BP 05/26/17 2013 (!) 181/100     Pulse Rate 05/26/17 2013 (!) 103     Resp 05/26/17 2013 16     Temp 05/26/17 2013 99 F (37.2 C)     Temp Source 05/26/17 2013 Oral     SpO2 05/26/17 2013 95 %     Weight --      Height --      Head Circumference --      Peak Flow --      Pain Score 05/26/17 2015 8     Pain Loc --      Pain Edu? --      Excl. in [place]? --    No data found.   Updated Vital Signs BP (!) 181/100 (BP Location: Left Arm)   Pulse (!) 103   Temp 99 F (37.2 C) (Oral)   Resp 16   SpO2 95%   Physical Exam  Constitutional: He is oriented to person, place, and time. He appears well-developed and well-nourished. No distress.  HENT:  Head: Normocephalic and atraumatic.  Eyes: Pupils are equal, round, and reactive to  light. Conjunctivae are normal.  Musculoskeletal:  Mild swelling of the lateral right ankle. Tenderness on palpation of right lateral malleolus. Tenderness on palpation of right proximal dorsal foot. Decreased ROM, stating painful on movement. Strength decreased due to pain. Sensation intact. Pedal pulses 2+. Cap refill <2s  Neurological: He is alert and oriented to person, place, and time.     UC Treatments / Results  Labs (all labs ordered are listed, but only abnormal results are displayed) Labs Reviewed - No data to display  EKG  EKG Interpretation None       Radiology Dg Ankle Complete Right  Result Date: 05/26/2017 CLINICAL DATA:  56 year old male with right ankle injury. EXAM: RIGHT ANKLE - COMPLETE 3+ VIEW COMPARISON:  None. FINDINGS: No definite acute fracture. An area of prominence over the lateral malleolus appears artifactual and not a fracture. There is no dislocation. The ankle mortise is  intact. There is mild soft tissue swelling over the lateral malleolus. Mild diffuse subcutaneous edema and vascular calcification over the dorsum of the foot. IMPRESSION: No acute fracture or dislocation. Soft tissue swelling over the lateral malleolus. Electronically Signed   By: Anner Crete M.D.   On: 05/26/2017 21:27   Dg Foot Complete Right  Result Date: 05/26/2017 CLINICAL DATA:  Patient with injury to the foot while playing golf. Lateral malleolus pain. Initial encounter. EXAM: RIGHT FOOT COMPLETE - 3+ VIEW COMPARISON:  None. FINDINGS: Normal anatomic alignment. No evidence for acute fracture or dislocation. Vascular calcifications. IMPRESSION: No acute osseous abnormality. Electronically Signed   By: Lovey Newcomer M.D.   On: 05/26/2017 21:25    Procedures Procedures (including critical care time)  Medications Ordered in UC Medications - No data to display   Initial Impression / Assessment and Plan / UC Course  I have reviewed the triage vital signs and the nursing notes.  Pertinent labs & imaging results that were available during my care of the patient were reviewed by me and considered in my medical decision making (see chart for details).    Xray negative for fracture or dislocation. NSAIDs, ice, and elevation. Ace wrap during activity. Follow up with PCP if symptoms not improving for further evaluation.   Final Clinical Impressions(s) / UC Diagnoses   Final diagnoses:  Acute right ankle pain  Right foot pain    New Prescriptions New Prescriptions   No medications on file      Keith Snow 05/26/17 2133

## 2021-04-09 ENCOUNTER — Inpatient Hospital Stay (HOSPITAL_COMMUNITY)
Admission: EM | Admit: 2021-04-09 | Discharge: 2021-05-01 | DRG: 193 | Disposition: A | Discharge status: Home Health | Payer: Medicare Other | Attending: Family Medicine | Admitting: Family Medicine

## 2021-04-09 ENCOUNTER — Other Ambulatory Visit: Payer: Self-pay

## 2021-04-09 ENCOUNTER — Encounter (HOSPITAL_COMMUNITY): Payer: Self-pay

## 2021-04-09 ENCOUNTER — Emergency Department (HOSPITAL_COMMUNITY): Payer: Medicare Other

## 2021-04-09 DIAGNOSIS — Z8616 Personal history of COVID-19: Secondary | ICD-10-CM

## 2021-04-09 DIAGNOSIS — I130 Hypertensive heart and chronic kidney disease with heart failure and stage 1 through stage 4 chronic kidney disease, or unspecified chronic kidney disease: Secondary | ICD-10-CM | POA: Diagnosis present

## 2021-04-09 DIAGNOSIS — Z8673 Personal history of transient ischemic attack (TIA), and cerebral infarction without residual deficits: Secondary | ICD-10-CM

## 2021-04-09 DIAGNOSIS — J9601 Acute respiratory failure with hypoxia: Secondary | ICD-10-CM

## 2021-04-09 DIAGNOSIS — Z20822 Contact with and (suspected) exposure to covid-19: Secondary | ICD-10-CM | POA: Diagnosis present

## 2021-04-09 DIAGNOSIS — Z8701 Personal history of pneumonia (recurrent): Secondary | ICD-10-CM

## 2021-04-09 DIAGNOSIS — R739 Hyperglycemia, unspecified: Secondary | ICD-10-CM | POA: Diagnosis present

## 2021-04-09 DIAGNOSIS — J9621 Acute and chronic respiratory failure with hypoxia: Secondary | ICD-10-CM

## 2021-04-09 DIAGNOSIS — E876 Hypokalemia: Secondary | ICD-10-CM | POA: Diagnosis present

## 2021-04-09 DIAGNOSIS — J9622 Acute and chronic respiratory failure with hypercapnia: Secondary | ICD-10-CM | POA: Diagnosis present

## 2021-04-09 DIAGNOSIS — Z7982 Long term (current) use of aspirin: Secondary | ICD-10-CM

## 2021-04-09 DIAGNOSIS — I10 Essential (primary) hypertension: Secondary | ICD-10-CM | POA: Diagnosis present

## 2021-04-09 DIAGNOSIS — G4733 Obstructive sleep apnea (adult) (pediatric): Secondary | ICD-10-CM | POA: Diagnosis present

## 2021-04-09 DIAGNOSIS — Z91199 Patient's noncompliance with other medical treatment and regimen due to unspecified reason: Secondary | ICD-10-CM

## 2021-04-09 DIAGNOSIS — J441 Chronic obstructive pulmonary disease with (acute) exacerbation: Secondary | ICD-10-CM | POA: Diagnosis present

## 2021-04-09 DIAGNOSIS — E11649 Type 2 diabetes mellitus with hypoglycemia without coma: Secondary | ICD-10-CM | POA: Diagnosis present

## 2021-04-09 DIAGNOSIS — G9341 Metabolic encephalopathy: Secondary | ICD-10-CM | POA: Diagnosis present

## 2021-04-09 DIAGNOSIS — N179 Acute kidney failure, unspecified: Secondary | ICD-10-CM

## 2021-04-09 DIAGNOSIS — Z79899 Other long term (current) drug therapy: Secondary | ICD-10-CM

## 2021-04-09 DIAGNOSIS — E8779 Other fluid overload: Secondary | ICD-10-CM

## 2021-04-09 DIAGNOSIS — I2510 Atherosclerotic heart disease of native coronary artery without angina pectoris: Secondary | ICD-10-CM | POA: Diagnosis present

## 2021-04-09 DIAGNOSIS — Z8249 Family history of ischemic heart disease and other diseases of the circulatory system: Secondary | ICD-10-CM

## 2021-04-09 DIAGNOSIS — E1165 Type 2 diabetes mellitus with hyperglycemia: Secondary | ICD-10-CM | POA: Diagnosis present

## 2021-04-09 DIAGNOSIS — J13 Pneumonia due to Streptococcus pneumoniae: Secondary | ICD-10-CM | POA: Diagnosis not present

## 2021-04-09 DIAGNOSIS — J440 Chronic obstructive pulmonary disease with acute lower respiratory infection: Secondary | ICD-10-CM | POA: Diagnosis present

## 2021-04-09 DIAGNOSIS — N1832 Chronic kidney disease, stage 3b: Secondary | ICD-10-CM

## 2021-04-09 DIAGNOSIS — F17290 Nicotine dependence, other tobacco product, uncomplicated: Secondary | ICD-10-CM | POA: Diagnosis present

## 2021-04-09 DIAGNOSIS — I5033 Acute on chronic diastolic (congestive) heart failure: Secondary | ICD-10-CM

## 2021-04-09 DIAGNOSIS — R0600 Dyspnea, unspecified: Secondary | ICD-10-CM

## 2021-04-09 DIAGNOSIS — Z7984 Long term (current) use of oral hypoglycemic drugs: Secondary | ICD-10-CM

## 2021-04-09 DIAGNOSIS — J9600 Acute respiratory failure, unspecified whether with hypoxia or hypercapnia: Secondary | ICD-10-CM

## 2021-04-09 DIAGNOSIS — Z794 Long term (current) use of insulin: Secondary | ICD-10-CM

## 2021-04-09 DIAGNOSIS — E669 Obesity, unspecified: Secondary | ICD-10-CM | POA: Diagnosis present

## 2021-04-09 DIAGNOSIS — T380X5A Adverse effect of glucocorticoids and synthetic analogues, initial encounter: Secondary | ICD-10-CM | POA: Diagnosis not present

## 2021-04-09 DIAGNOSIS — J8489 Other specified interstitial pulmonary diseases: Secondary | ICD-10-CM | POA: Diagnosis present

## 2021-04-09 DIAGNOSIS — R0689 Other abnormalities of breathing: Secondary | ICD-10-CM

## 2021-04-09 DIAGNOSIS — T17908A Unspecified foreign body in respiratory tract, part unspecified causing other injury, initial encounter: Secondary | ICD-10-CM

## 2021-04-09 DIAGNOSIS — N1831 Chronic kidney disease, stage 3a: Secondary | ICD-10-CM | POA: Diagnosis present

## 2021-04-09 DIAGNOSIS — Z23 Encounter for immunization: Secondary | ICD-10-CM

## 2021-04-09 DIAGNOSIS — E8770 Fluid overload, unspecified: Secondary | ICD-10-CM

## 2021-04-09 DIAGNOSIS — Z6831 Body mass index (BMI) 31.0-31.9, adult: Secondary | ICD-10-CM

## 2021-04-09 DIAGNOSIS — R031 Nonspecific low blood-pressure reading: Secondary | ICD-10-CM | POA: Diagnosis not present

## 2021-04-09 DIAGNOSIS — I252 Old myocardial infarction: Secondary | ICD-10-CM

## 2021-04-09 DIAGNOSIS — E782 Mixed hyperlipidemia: Secondary | ICD-10-CM | POA: Diagnosis present

## 2021-04-09 DIAGNOSIS — R918 Other nonspecific abnormal finding of lung field: Secondary | ICD-10-CM

## 2021-04-09 DIAGNOSIS — D638 Anemia in other chronic diseases classified elsewhere: Secondary | ICD-10-CM | POA: Diagnosis present

## 2021-04-09 DIAGNOSIS — D509 Iron deficiency anemia, unspecified: Secondary | ICD-10-CM | POA: Diagnosis present

## 2021-04-09 DIAGNOSIS — I248 Other forms of acute ischemic heart disease: Secondary | ICD-10-CM | POA: Diagnosis present

## 2021-04-09 DIAGNOSIS — E1122 Type 2 diabetes mellitus with diabetic chronic kidney disease: Secondary | ICD-10-CM | POA: Diagnosis present

## 2021-04-09 HISTORY — DX: Unspecified intracranial injury with loss of consciousness status unknown, initial encounter: S06.9XAA

## 2021-04-09 HISTORY — DX: Type 2 diabetes mellitus without complications: E11.9

## 2021-04-09 HISTORY — DX: Unspecified intracranial injury with loss of consciousness of unspecified duration, initial encounter: S06.9X9A

## 2021-04-09 HISTORY — DX: Essential (primary) hypertension: I10

## 2021-04-09 HISTORY — DX: Rider (driver) (passenger) of other motorcycle injured in unspecified traffic accident, initial encounter: V29.99XA

## 2021-04-09 HISTORY — DX: Cerebral infarction, unspecified: I63.9

## 2021-04-09 HISTORY — DX: Malignant (primary) neoplasm, unspecified: C80.1

## 2021-04-09 LAB — CBG MONITORING, ED: Glucose-Capillary: 551 mg/dL (ref 70–99)

## 2021-04-09 MED ORDER — SODIUM CHLORIDE 0.9 % IV BOLUS
1000.0000 mL | Freq: Once | INTRAVENOUS | Status: AC
  Administered 2021-04-10: 1000 mL via INTRAVENOUS

## 2021-04-09 NOTE — ED Triage Notes (Signed)
Pt arrived from home c/o hyperglycemia all day today. Pt reports his sugar has been in the 500's at home all day and last took 10units of novolog at 1600 today. Pts CBG in Triage is 551.
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
## 2021-04-10 ENCOUNTER — Inpatient Hospital Stay (HOSPITAL_COMMUNITY): Payer: Medicare Other

## 2021-04-10 DIAGNOSIS — G4733 Obstructive sleep apnea (adult) (pediatric): Secondary | ICD-10-CM | POA: Diagnosis present

## 2021-04-10 DIAGNOSIS — E876 Hypokalemia: Secondary | ICD-10-CM | POA: Diagnosis present

## 2021-04-10 DIAGNOSIS — J9601 Acute respiratory failure with hypoxia: Secondary | ICD-10-CM | POA: Diagnosis present

## 2021-04-10 DIAGNOSIS — J189 Pneumonia, unspecified organism: Secondary | ICD-10-CM | POA: Diagnosis not present

## 2021-04-10 DIAGNOSIS — N179 Acute kidney failure, unspecified: Secondary | ICD-10-CM | POA: Diagnosis present

## 2021-04-10 DIAGNOSIS — I248 Other forms of acute ischemic heart disease: Secondary | ICD-10-CM | POA: Diagnosis present

## 2021-04-10 DIAGNOSIS — I10 Essential (primary) hypertension: Secondary | ICD-10-CM | POA: Diagnosis present

## 2021-04-10 DIAGNOSIS — I130 Hypertensive heart and chronic kidney disease with heart failure and stage 1 through stage 4 chronic kidney disease, or unspecified chronic kidney disease: Secondary | ICD-10-CM | POA: Diagnosis present

## 2021-04-10 DIAGNOSIS — Z8616 Personal history of COVID-19: Secondary | ICD-10-CM | POA: Diagnosis not present

## 2021-04-10 DIAGNOSIS — E11649 Type 2 diabetes mellitus with hypoglycemia without coma: Secondary | ICD-10-CM | POA: Diagnosis present

## 2021-04-10 DIAGNOSIS — R918 Other nonspecific abnormal finding of lung field: Secondary | ICD-10-CM | POA: Diagnosis not present

## 2021-04-10 DIAGNOSIS — N1832 Chronic kidney disease, stage 3b: Secondary | ICD-10-CM | POA: Diagnosis not present

## 2021-04-10 DIAGNOSIS — Z794 Long term (current) use of insulin: Secondary | ICD-10-CM | POA: Diagnosis not present

## 2021-04-10 DIAGNOSIS — J9621 Acute and chronic respiratory failure with hypoxia: Secondary | ICD-10-CM | POA: Diagnosis present

## 2021-04-10 DIAGNOSIS — J8489 Other specified interstitial pulmonary diseases: Secondary | ICD-10-CM | POA: Diagnosis present

## 2021-04-10 DIAGNOSIS — R739 Hyperglycemia, unspecified: Secondary | ICD-10-CM | POA: Diagnosis not present

## 2021-04-10 DIAGNOSIS — D638 Anemia in other chronic diseases classified elsewhere: Secondary | ICD-10-CM | POA: Diagnosis present

## 2021-04-10 DIAGNOSIS — Z6831 Body mass index (BMI) 31.0-31.9, adult: Secondary | ICD-10-CM | POA: Diagnosis not present

## 2021-04-10 DIAGNOSIS — J9622 Acute and chronic respiratory failure with hypercapnia: Secondary | ICD-10-CM | POA: Diagnosis present

## 2021-04-10 DIAGNOSIS — J440 Chronic obstructive pulmonary disease with acute lower respiratory infection: Secondary | ICD-10-CM | POA: Diagnosis present

## 2021-04-10 DIAGNOSIS — N1831 Chronic kidney disease, stage 3a: Secondary | ICD-10-CM | POA: Diagnosis present

## 2021-04-10 DIAGNOSIS — D509 Iron deficiency anemia, unspecified: Secondary | ICD-10-CM | POA: Diagnosis present

## 2021-04-10 DIAGNOSIS — Z20822 Contact with and (suspected) exposure to covid-19: Secondary | ICD-10-CM | POA: Diagnosis present

## 2021-04-10 DIAGNOSIS — E1122 Type 2 diabetes mellitus with diabetic chronic kidney disease: Secondary | ICD-10-CM | POA: Diagnosis present

## 2021-04-10 DIAGNOSIS — J13 Pneumonia due to Streptococcus pneumoniae: Secondary | ICD-10-CM | POA: Diagnosis present

## 2021-04-10 DIAGNOSIS — I5033 Acute on chronic diastolic (congestive) heart failure: Secondary | ICD-10-CM | POA: Diagnosis present

## 2021-04-10 DIAGNOSIS — E8779 Other fluid overload: Secondary | ICD-10-CM | POA: Diagnosis not present

## 2021-04-10 DIAGNOSIS — I339 Acute and subacute endocarditis, unspecified: Secondary | ICD-10-CM | POA: Diagnosis not present

## 2021-04-10 DIAGNOSIS — G9341 Metabolic encephalopathy: Secondary | ICD-10-CM | POA: Diagnosis present

## 2021-04-10 DIAGNOSIS — E1165 Type 2 diabetes mellitus with hyperglycemia: Secondary | ICD-10-CM | POA: Diagnosis present

## 2021-04-10 DIAGNOSIS — E669 Obesity, unspecified: Secondary | ICD-10-CM | POA: Diagnosis present

## 2021-04-10 DIAGNOSIS — J441 Chronic obstructive pulmonary disease with (acute) exacerbation: Secondary | ICD-10-CM | POA: Diagnosis present

## 2021-04-10 DIAGNOSIS — Z23 Encounter for immunization: Secondary | ICD-10-CM | POA: Diagnosis present

## 2021-04-10 LAB — ECHOCARDIOGRAM COMPLETE
Area-P 1/2: 4.15 cm2
Height: 69 in
S' Lateral: 3.1 cm
Weight: 3472 oz

## 2021-04-10 LAB — HEMOGLOBIN A1C
Hgb A1c MFr Bld: 10.3 % — ABNORMAL HIGH (ref 4.8–5.6)
Mean Plasma Glucose: 248.91 mg/dL

## 2021-04-10 LAB — URINALYSIS, ROUTINE W REFLEX MICROSCOPIC
Bacteria, UA: NONE SEEN
Bilirubin Urine: NEGATIVE
Glucose, UA: >=500 mg/dL — AB
Ketones, ur: NEGATIVE mg/dL
Leukocytes,Ua: NEGATIVE
Nitrite: NEGATIVE
Protein, ur: 30 mg/dL — AB
Specific Gravity, Urine: 1.02 (ref 1.005–1.030)
pH: 6 (ref 5.0–8.0)

## 2021-04-10 LAB — COMPREHENSIVE METABOLIC PANEL
ALT: 22 U/L (ref 0–44)
AST: 21 U/L (ref 15–41)
Albumin: 3 g/dL — ABNORMAL LOW (ref 3.5–5.0)
Alkaline Phosphatase: 61 U/L (ref 38–126)
Anion gap: 8 (ref 5–15)
BUN: 54 mg/dL — ABNORMAL HIGH (ref 6–20)
CO2: 25 mmol/L (ref 22–32)
Calcium: 8.4 mg/dL — ABNORMAL LOW (ref 8.9–10.3)
Chloride: 104 mmol/L (ref 98–111)
Creatinine, Ser: 2.21 mg/dL — ABNORMAL HIGH (ref 0.61–1.24)
GFR, Estimated: 34 mL/min — ABNORMAL LOW (ref >60)
Glucose, Bld: 534 mg/dL (ref 70–99)
Potassium: 3.6 mmol/L (ref 3.5–5.1)
Sodium: 137 mmol/L (ref 135–145)
Total Bilirubin: 0.9 mg/dL (ref 0.3–1.2)
Total Protein: 7.2 g/dL (ref 6.5–8.1)

## 2021-04-10 LAB — CBC WITH DIFFERENTIAL/PLATELET
Abs Immature Granulocytes: 0.02 10*3/uL (ref 0.00–0.07)
Basophils Absolute: 0 10*3/uL (ref 0.0–0.1)
Basophils Relative: 0 %
Eosinophils Absolute: 0.1 10*3/uL (ref 0.0–0.5)
Eosinophils Relative: 1 %
HCT: 36.7 % — ABNORMAL LOW (ref 39.0–52.0)
Hemoglobin: 12.2 g/dL — ABNORMAL LOW (ref 13.0–17.0)
Immature Granulocytes: 0 %
Lymphocytes Relative: 8 %
Lymphs Abs: 0.8 10*3/uL (ref 0.7–4.0)
MCH: 30.7 pg (ref 26.0–34.0)
MCHC: 33.2 g/dL (ref 30.0–36.0)
MCV: 92.4 fL (ref 80.0–100.0)
Monocytes Absolute: 0.8 10*3/uL (ref 0.1–1.0)
Monocytes Relative: 8 %
Neutro Abs: 8.2 10*3/uL — ABNORMAL HIGH (ref 1.7–7.7)
Neutrophils Relative %: 83 %
Platelets: 271 10*3/uL (ref 150–400)
RBC: 3.97 MIL/uL — ABNORMAL LOW (ref 4.22–5.81)
RDW: 13.6 % (ref 11.5–15.5)
WBC: 10 10*3/uL (ref 4.0–10.5)
nRBC: 0 % (ref 0.0–0.2)

## 2021-04-10 LAB — CBG MONITORING, ED
Glucose-Capillary: 407 mg/dL — ABNORMAL HIGH (ref 70–99)
Glucose-Capillary: 342 mg/dL — ABNORMAL HIGH (ref 70–99)
Glucose-Capillary: 214 mg/dL — ABNORMAL HIGH (ref 70–99)
Glucose-Capillary: 121 mg/dL — ABNORMAL HIGH (ref 70–99)
Glucose-Capillary: 95 mg/dL (ref 70–99)
Glucose-Capillary: 71 mg/dL (ref 70–99)
Glucose-Capillary: 129 mg/dL — ABNORMAL HIGH (ref 70–99)
Glucose-Capillary: 51 mg/dL — ABNORMAL LOW (ref 70–99)
Glucose-Capillary: 30 mg/dL — CL (ref 70–99)
Glucose-Capillary: 71 mg/dL (ref 70–99)
Glucose-Capillary: 95 mg/dL (ref 70–99)
Glucose-Capillary: 63 mg/dL — ABNORMAL LOW (ref 70–99)
Glucose-Capillary: 85 mg/dL (ref 70–99)
Glucose-Capillary: 236 mg/dL — ABNORMAL HIGH (ref 70–99)

## 2021-04-10 LAB — CBC
HCT: 34.8 % — ABNORMAL LOW (ref 39.0–52.0)
Hemoglobin: 11.5 g/dL — ABNORMAL LOW (ref 13.0–17.0)
MCH: 31.1 pg (ref 26.0–34.0)
MCHC: 33 g/dL (ref 30.0–36.0)
MCV: 94.1 fL (ref 80.0–100.0)
Platelets: 232 10*3/uL (ref 150–400)
RBC: 3.7 MIL/uL — ABNORMAL LOW (ref 4.22–5.81)
RDW: 13.7 % (ref 11.5–15.5)
WBC: 9.7 10*3/uL (ref 4.0–10.5)
nRBC: 0 % (ref 0.0–0.2)

## 2021-04-10 LAB — BASIC METABOLIC PANEL
Anion gap: 7 (ref 5–15)
Anion gap: <0 — ABNORMAL LOW (ref 5–15)
Anion gap: 8 (ref 5–15)
Anion gap: 6 (ref 5–15)
Anion gap: 9 (ref 5–15)
BUN: 50 mg/dL — ABNORMAL HIGH (ref 6–20)
BUN: 44 mg/dL — ABNORMAL HIGH (ref 6–20)
BUN: 41 mg/dL — ABNORMAL HIGH (ref 6–20)
BUN: 39 mg/dL — ABNORMAL HIGH (ref 6–20)
BUN: 35 mg/dL — ABNORMAL HIGH (ref 6–20)
CO2: 23 mmol/L (ref 22–32)
CO2: 32 mmol/L (ref 22–32)
CO2: 22 mmol/L (ref 22–32)
CO2: 28 mmol/L (ref 22–32)
CO2: 23 mmol/L (ref 22–32)
Calcium: 8.1 mg/dL — ABNORMAL LOW (ref 8.9–10.3)
Calcium: 8.4 mg/dL — ABNORMAL LOW (ref 8.9–10.3)
Calcium: 8.4 mg/dL — ABNORMAL LOW (ref 8.9–10.3)
Calcium: 8.4 mg/dL — ABNORMAL LOW (ref 8.9–10.3)
Calcium: 8.1 mg/dL — ABNORMAL LOW (ref 8.9–10.3)
Chloride: 109 mmol/L (ref 98–111)
Chloride: 113 mmol/L — ABNORMAL HIGH (ref 98–111)
Chloride: 111 mmol/L (ref 98–111)
Chloride: 109 mmol/L (ref 98–111)
Chloride: 112 mmol/L — ABNORMAL HIGH (ref 98–111)
Creatinine, Ser: 2.04 mg/dL — ABNORMAL HIGH (ref 0.61–1.24)
Creatinine, Ser: 1.95 mg/dL — ABNORMAL HIGH (ref 0.61–1.24)
Creatinine, Ser: 1.79 mg/dL — ABNORMAL HIGH (ref 0.61–1.24)
Creatinine, Ser: 1.82 mg/dL — ABNORMAL HIGH (ref 0.61–1.24)
Creatinine, Ser: 1.73 mg/dL — ABNORMAL HIGH (ref 0.61–1.24)
GFR, Estimated: 37 mL/min — ABNORMAL LOW (ref >60)
GFR, Estimated: 39 mL/min — ABNORMAL LOW (ref >60)
GFR, Estimated: 43 mL/min — ABNORMAL LOW (ref >60)
GFR, Estimated: 43 mL/min — ABNORMAL LOW (ref >60)
GFR, Estimated: 45 mL/min — ABNORMAL LOW (ref >60)
Glucose, Bld: 419 mg/dL — ABNORMAL HIGH (ref 70–99)
Glucose, Bld: 41 mg/dL — CL (ref 70–99)
Glucose, Bld: 238 mg/dL — ABNORMAL HIGH (ref 70–99)
Glucose, Bld: 173 mg/dL — ABNORMAL HIGH (ref 70–99)
Glucose, Bld: 173 mg/dL — ABNORMAL HIGH (ref 70–99)
Potassium: 3.5 mmol/L (ref 3.5–5.1)
Potassium: 3.5 mmol/L (ref 3.5–5.1)
Potassium: 3.4 mmol/L — ABNORMAL LOW (ref 3.5–5.1)
Potassium: 3.5 mmol/L (ref 3.5–5.1)
Potassium: 3.2 mmol/L — ABNORMAL LOW (ref 3.5–5.1)
Sodium: 139 mmol/L (ref 135–145)
Sodium: 145 mmol/L (ref 135–145)
Sodium: 141 mmol/L (ref 135–145)
Sodium: 143 mmol/L (ref 135–145)
Sodium: 144 mmol/L (ref 135–145)

## 2021-04-10 LAB — TROPONIN I (HIGH SENSITIVITY)
Troponin I (High Sensitivity): 87 ng/L — ABNORMAL HIGH (ref <18)
Troponin I (High Sensitivity): 91 ng/L — ABNORMAL HIGH (ref <18)

## 2021-04-10 LAB — BLOOD GAS, VENOUS
Acid-Base Excess: 0.7 mmol/L (ref 0.0–2.0)
Bicarbonate: 24.5 mmol/L (ref 20.0–28.0)
FIO2: 40
O2 Saturation: 84.3 %
Patient temperature: 36.9
pCO2, Ven: 44 mmHg (ref 44.0–60.0)
pH, Ven: 7.377 (ref 7.250–7.430)
pO2, Ven: 54.9 mmHg — ABNORMAL HIGH (ref 32.0–45.0)

## 2021-04-10 LAB — LACTIC ACID, PLASMA
Lactic Acid, Venous: 1.6 mmol/L (ref 0.5–1.9)
Lactic Acid, Venous: 1.6 mmol/L (ref 0.5–1.9)

## 2021-04-10 LAB — RESP PANEL BY RT-PCR (FLU A&B, COVID) ARPGX2
Influenza A by PCR: NEGATIVE
Influenza B by PCR: NEGATIVE
SARS Coronavirus 2 by RT PCR: NEGATIVE

## 2021-04-10 LAB — GLUCOSE, CAPILLARY
Glucose-Capillary: 199 mg/dL — ABNORMAL HIGH (ref 70–99)
Glucose-Capillary: 144 mg/dL — ABNORMAL HIGH (ref 70–99)

## 2021-04-10 LAB — STREP PNEUMONIAE URINARY ANTIGEN: Strep Pneumo Urinary Antigen: NEGATIVE

## 2021-04-10 LAB — OSMOLALITY: Osmolality: 338 mOsm/kg (ref 275–295)

## 2021-04-10 LAB — HIV ANTIBODY (ROUTINE TESTING W REFLEX): HIV Screen 4th Generation wRfx: NONREACTIVE

## 2021-04-10 LAB — MRSA NEXT GEN BY PCR, NASAL: MRSA by PCR Next Gen: NOT DETECTED

## 2021-04-10 LAB — BRAIN NATRIURETIC PEPTIDE: B Natriuretic Peptide: 1441 pg/mL — ABNORMAL HIGH (ref 0.0–100.0)

## 2021-04-10 MED ORDER — SODIUM CHLORIDE 0.9 % IV SOLN
500.0000 mg | Freq: Once | INTRAVENOUS | Status: AC
  Administered 2021-04-10: 500 mg via INTRAVENOUS
  Filled 2021-04-10: qty 500

## 2021-04-10 MED ORDER — FUROSEMIDE 10 MG/ML IJ SOLN
40.0000 mg | Freq: Once | INTRAMUSCULAR | Status: AC
  Administered 2021-04-10: 40 mg via INTRAVENOUS
  Filled 2021-04-10: qty 4

## 2021-04-10 MED ORDER — CHLORHEXIDINE GLUCONATE CLOTH 2 % EX PADS
6.0000 | MEDICATED_PAD | Freq: Every day | CUTANEOUS | Status: DC
  Administered 2021-04-10 – 2021-04-27 (×19): 6 via TOPICAL

## 2021-04-10 MED ORDER — ALBUTEROL SULFATE (2.5 MG/3ML) 0.083% IN NEBU: 2.5000 mg | INHALATION_SOLUTION | RESPIRATORY_TRACT | Status: DC | PRN

## 2021-04-10 MED ORDER — SPIRONOLACTONE 25 MG PO TABS
12.5000 mg | ORAL_TABLET | Freq: Every day | ORAL | Status: DC
  Administered 2021-04-10 – 2021-04-11 (×2): 12.5 mg via ORAL
  Filled 2021-04-10 (×2): qty 0.5
  Filled 2021-04-10: qty 1
  Filled 2021-04-10: qty 0.5
  Filled 2021-04-10: qty 1
  Filled 2021-04-10 (×2): qty 0.5

## 2021-04-10 MED ORDER — BUPROPION HCL 100 MG PO TABS
100.0000 mg | ORAL_TABLET | Freq: Two times a day (BID) | ORAL | Status: DC
  Administered 2021-04-10 – 2021-05-01 (×43): 100 mg via ORAL
  Filled 2021-04-10 (×50): qty 1

## 2021-04-10 MED ORDER — HYDRALAZINE HCL 25 MG PO TABS
100.0000 mg | ORAL_TABLET | Freq: Two times a day (BID) | ORAL | Status: DC
  Administered 2021-04-10 (×2): 100 mg via ORAL
  Filled 2021-04-10 (×2): qty 4

## 2021-04-10 MED ORDER — ONDANSETRON HCL 4 MG PO TABS: 4.0000 mg | ORAL_TABLET | Freq: Four times a day (QID) | ORAL | Status: DC | PRN

## 2021-04-10 MED ORDER — OXYCODONE HCL 5 MG PO TABS
5.0000 mg | ORAL_TABLET | ORAL | Status: DC | PRN
  Administered 2021-04-18: 5 mg via ORAL
  Filled 2021-04-10: qty 1

## 2021-04-10 MED ORDER — HEPARIN SODIUM (PORCINE) 5000 UNIT/ML IJ SOLN
5000.0000 [IU] | Freq: Three times a day (TID) | INTRAMUSCULAR | Status: DC
  Administered 2021-04-10 – 2021-05-01 (×64): 5000 [IU] via SUBCUTANEOUS
  Filled 2021-04-10 (×65): qty 1

## 2021-04-10 MED ORDER — TRAZODONE HCL 50 MG PO TABS
100.0000 mg | ORAL_TABLET | Freq: Every evening | ORAL | Status: DC | PRN
  Administered 2021-04-27 – 2021-04-29 (×4): 100 mg via ORAL
  Filled 2021-04-10 (×4): qty 2

## 2021-04-10 MED ORDER — ACETAMINOPHEN 325 MG PO TABS
650.0000 mg | ORAL_TABLET | Freq: Four times a day (QID) | ORAL | Status: DC | PRN
  Administered 2021-04-10 – 2021-04-13 (×4): 650 mg via ORAL
  Filled 2021-04-10 (×4): qty 2

## 2021-04-10 MED ORDER — DEXTROSE-NACL 5-0.45 % IV SOLN: INTRAVENOUS | Status: DC

## 2021-04-10 MED ORDER — SPIRONOLACTONE 25 MG PO TABS: 25.0000 mg | ORAL_TABLET | Freq: Every day | ORAL | Status: DC

## 2021-04-10 MED ORDER — INSULIN ASPART 100 UNIT/ML IJ SOLN
0.0000 [IU] | Freq: Three times a day (TID) | INTRAMUSCULAR | Status: DC
  Administered 2021-04-10: 2 [IU] via SUBCUTANEOUS
  Administered 2021-04-11: 8 [IU] via SUBCUTANEOUS
  Administered 2021-04-11: 11 [IU] via SUBCUTANEOUS
  Administered 2021-04-11: 5 [IU] via SUBCUTANEOUS
  Administered 2021-04-12: 11 [IU] via SUBCUTANEOUS
  Administered 2021-04-12: 8 [IU] via SUBCUTANEOUS
  Administered 2021-04-12: 11 [IU] via SUBCUTANEOUS
  Administered 2021-04-13: 5 [IU] via SUBCUTANEOUS
  Administered 2021-04-13: 3 [IU] via SUBCUTANEOUS
  Administered 2021-04-13: 5 [IU] via SUBCUTANEOUS
  Administered 2021-04-14 (×2): 15 [IU] via SUBCUTANEOUS
  Administered 2021-04-14: 2 [IU] via SUBCUTANEOUS
  Administered 2021-04-15: 8 [IU] via SUBCUTANEOUS
  Administered 2021-04-15: 11 [IU] via SUBCUTANEOUS
  Administered 2021-04-15: 8 [IU] via SUBCUTANEOUS
  Administered 2021-04-16: 11 [IU] via SUBCUTANEOUS
  Administered 2021-04-16: 3 [IU] via SUBCUTANEOUS
  Administered 2021-04-16 – 2021-04-17 (×2): 5 [IU] via SUBCUTANEOUS
  Administered 2021-04-17 (×2): 8 [IU] via SUBCUTANEOUS
  Administered 2021-04-18: 2 [IU] via SUBCUTANEOUS
  Administered 2021-04-18 (×2): 3 [IU] via SUBCUTANEOUS
  Administered 2021-04-19: 5 [IU] via SUBCUTANEOUS
  Administered 2021-04-19: 8 [IU] via SUBCUTANEOUS
  Administered 2021-04-19: 5 [IU] via SUBCUTANEOUS

## 2021-04-10 MED ORDER — POTASSIUM CHLORIDE 10 MEQ/100ML IV SOLN
10.0000 meq | INTRAVENOUS | Status: AC
Start: 2021-04-10 — End: 2021-04-10
  Administered 2021-04-10 (×2): 10 meq via INTRAVENOUS
  Filled 2021-04-10 (×2): qty 100

## 2021-04-10 MED ORDER — INSULIN ASPART 100 UNIT/ML IJ SOLN
0.0000 [IU] | INTRAMUSCULAR | Status: DC
  Administered 2021-04-10: 11 [IU] via SUBCUTANEOUS

## 2021-04-10 MED ORDER — DEXTROSE 50 % IV SOLN
25.0000 mL | Freq: Once | INTRAVENOUS | Status: AC
  Administered 2021-04-10: 25 mL via INTRAVENOUS
  Filled 2021-04-10: qty 50

## 2021-04-10 MED ORDER — HYDRALAZINE HCL 20 MG/ML IJ SOLN
10.0000 mg | Freq: Four times a day (QID) | INTRAMUSCULAR | Status: DC | PRN
  Administered 2021-04-11: 10 mg via INTRAVENOUS
  Filled 2021-04-10: qty 1

## 2021-04-10 MED ORDER — SODIUM CHLORIDE 0.9 % IV SOLN
500.0000 mg | INTRAVENOUS | Status: DC
  Administered 2021-04-11 – 2021-04-13 (×3): 500 mg via INTRAVENOUS
  Filled 2021-04-10 (×3): qty 500

## 2021-04-10 MED ORDER — SODIUM CHLORIDE 0.9 % IV SOLN
1.0000 g | Freq: Once | INTRAVENOUS | Status: AC
  Administered 2021-04-10: 1 g via INTRAVENOUS
  Filled 2021-04-10: qty 10

## 2021-04-10 MED ORDER — ROSUVASTATIN CALCIUM 20 MG PO TABS
20.0000 mg | ORAL_TABLET | Freq: Every day | ORAL | Status: DC
  Administered 2021-04-10 – 2021-05-01 (×22): 20 mg via ORAL
  Filled 2021-04-10 (×22): qty 1

## 2021-04-10 MED ORDER — NIFEDIPINE ER OSMOTIC RELEASE 30 MG PO TB24
60.0000 mg | ORAL_TABLET | Freq: Every day | ORAL | Status: DC
  Administered 2021-04-10: 60 mg via ORAL
  Filled 2021-04-10: qty 2

## 2021-04-10 MED ORDER — INFLUENZA VAC SPLIT QUAD 0.5 ML IM SUSY
0.5000 mL | PREFILLED_SYRINGE | INTRAMUSCULAR | Status: AC
  Administered 2021-04-11: 0.5 mL via INTRAMUSCULAR
  Filled 2021-04-10: qty 0.5

## 2021-04-10 MED ORDER — ASPIRIN EC 81 MG PO TBEC
81.0000 mg | DELAYED_RELEASE_TABLET | Freq: Every day | ORAL | Status: DC
  Administered 2021-04-10 – 2021-05-01 (×22): 81 mg via ORAL
  Filled 2021-04-10 (×23): qty 1

## 2021-04-10 MED ORDER — DEXTROSE 50 % IV SOLN
50.0000 mL | Freq: Once | INTRAVENOUS | Status: AC
  Administered 2021-04-10: 50 mL via INTRAVENOUS
  Filled 2021-04-10: qty 50

## 2021-04-10 MED ORDER — INSULIN ASPART 100 UNIT/ML IV SOLN
10.0000 [IU] | Freq: Once | INTRAVENOUS | Status: AC
  Administered 2021-04-10: 10 [IU] via INTRAVENOUS

## 2021-04-10 MED ORDER — INSULIN ASPART 100 UNIT/ML IJ SOLN
8.0000 [IU] | Freq: Once | INTRAMUSCULAR | Status: AC
  Administered 2021-04-10: 8 [IU] via SUBCUTANEOUS

## 2021-04-10 MED ORDER — ONDANSETRON HCL 4 MG/2ML IJ SOLN: 4.0000 mg | Freq: Four times a day (QID) | INTRAMUSCULAR | Status: DC | PRN

## 2021-04-10 MED ORDER — ACETAMINOPHEN 650 MG RE SUPP: 650.0000 mg | Freq: Four times a day (QID) | RECTAL | Status: DC | PRN

## 2021-04-10 MED ORDER — CARVEDILOL 12.5 MG PO TABS
25.0000 mg | ORAL_TABLET | Freq: Two times a day (BID) | ORAL | Status: DC
  Administered 2021-04-10 – 2021-04-16 (×11): 25 mg via ORAL
  Filled 2021-04-10 (×12): qty 2

## 2021-04-10 MED ORDER — FUROSEMIDE 10 MG/ML IJ SOLN
40.0000 mg | Freq: Two times a day (BID) | INTRAMUSCULAR | Status: DC
  Administered 2021-04-11 – 2021-04-12 (×3): 40 mg via INTRAVENOUS
  Filled 2021-04-10 (×3): qty 4

## 2021-04-10 MED ORDER — INSULIN ASPART 100 UNIT/ML IJ SOLN
0.0000 [IU] | Freq: Every day | INTRAMUSCULAR | Status: DC
Start: 2021-04-10 — End: 2021-04-20
  Administered 2021-04-11: 2 [IU] via SUBCUTANEOUS
  Administered 2021-04-13: 3 [IU] via SUBCUTANEOUS

## 2021-04-10 MED ORDER — SACUBITRIL-VALSARTAN 24-26 MG PO TABS
1.0000 | ORAL_TABLET | Freq: Two times a day (BID) | ORAL | Status: DC
  Administered 2021-04-10 – 2021-04-11 (×4): 1 via ORAL
  Filled 2021-04-10 (×4): qty 1

## 2021-04-10 MED ORDER — LABETALOL HCL 5 MG/ML IV SOLN
10.0000 mg | INTRAVENOUS | Status: DC | PRN
  Administered 2021-04-10: 10 mg via INTRAVENOUS
  Filled 2021-04-10: qty 4

## 2021-04-10 MED ORDER — SODIUM CHLORIDE 0.9 % IV SOLN
1.0000 g | INTRAVENOUS | Status: DC
  Administered 2021-04-11 – 2021-04-13 (×3): 1 g via INTRAVENOUS
  Filled 2021-04-10 (×3): qty 10

## 2021-04-10 NOTE — ED Notes (Signed)
Dr. Roger Shelter notified of pt's cbg continuing to drop despite amps of d50 being provided. 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

## 2021-04-10 NOTE — ED Notes (Signed)
Date and time results received: 04/10/21 @ 1058   Test: Glucose Critical Value: 30  Name of Provider Notified: Dr Roger Shelter  Orders Received? Or Actions Taken?: Orders Received - See Orders for details
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
## 2021-04-10 NOTE — Progress Notes (Signed)
Messaged Dr. Clearence Ped about critical of 338 serum osmolarity.told to contact Dr. Roger Shelter which I did.

## 2021-04-10 NOTE — Progress Notes (Signed)
*  PRELIMINARY RESULTS* Echocardiogram 2D Echocardiogram has been performed.  Keith Snow 04/10/2021, 10:10 AM

## 2021-04-10 NOTE — Progress Notes (Signed)
PROGRESS NOTE    Patient: Keith Snow                            PCP: System, Provider Not In                    DOB: 1960-10-07            DOA: 04/09/2021 [id number]             DOS: 04/10/2021, 11:34 AM   LOS: 0 days   Date of Service: The patient was seen and examined on 04/10/2021  Subjective:   The patient was seen and examined this morning. Hemodynamically stable, on BiPAP satting 97% Patient remains somnolent but arousable Patient's blood sugar is running low, last CBG 41,  Brief Narrative:   Dillan Tourville  is a 60 y.o. male, with history of diabetes mellitus type 2, hypertension, myocardial infarction, CHF, and more presents ED with a chief complaint of altered mental status.  Patient remains somnolent noted for hypoglycemia, hypoxia.  Mother provided most of the history.  He has been sleeping continuously, having polyuria, and polydipsia.  She reports that it started during the night, and is continued to get worse.  He had decreased appetite and did not eat any breakfast.  Reporting of diabetes mellitus with insulin-dependent, also was on oxygen till recently for collapsed lung.    Mother reports that he does not smoke, he does not drink, he is vaccinated for COVID, he is full code.    ED: Slightly febrile 100.6, HR 82-95, RR 16, BP 173/100, satting  90s on 4 L nasal cannula WBC 10, hemoglobin 12.2, BUN 54, creatinine 2.21, initial sugar >500 BNP 1141, negative COVID, UA  > 500  negative ketones Chest x-ray shows a focal consolidation in the right lower lung that is infectious versus inflammatory Patient was given 10 units regular insulin, and Rocephin and Zithromax as well as 1 L normal saline VBG done on admission shows pH 7.37, PCO2 44, PO2 549, bicarb to 45 Admission was requested for further work-up of persistent tachycardia and tachypnea    Assessment & Plan:   Active Problems:   Acute respiratory failure with hypoxia (HCC)   Acute respiratory failure with  hypoxia Most likely secondary to pneumonia Patient has been briefly on BiPAP, planning to wean off to O2 by nasal cannula Goal to maintain O2 sat greater 92% Continue DuoNeb bronchodilators, treating underlying cause of pneumonia  Community Acquired Pneumonia  Chest x-ray was reviewed, T-max 100.6 most likely causative organism is strep pneumonia, currently afebrile normotensive We will continue current antibiotics of Rocephin and azithromycin Blood cultures, sputum cultures, and urine antigens pending   Acute metabolic encephalopathy Multifactorial: Likely due to hypoxia, hypoglycemia, with hypoxia, infection  Continue supplemental oxygen, continue antibiotics and aim for euglycemia  Hyperglycemia in the setting of diabetes mellitus type 2 Now patient is persistently hypoglycemic, last CBG 41, On admission blood sugars were> 500, 10 units of regular insulin was given, now CBGs are dropping We will continue to monitor closely, as blood sugars improve, will discontinue D5 half-normal saline drip Patient does take insulin at home, however dosages are unknown Diabetic diet, checking CBG q. ACH S, with SSI coverage  CHF systolic versus diastolic BNP is elevated at 1441, chest x-ray does not show vascular congestion or pulmonary edema Patient does not appear fluid overloaded No signs of overt exacerbation-monitoring closely Continue home medication of, continue  aspirin, Coreg, hydralazine, Entresto, Aldactone Echocardiogram: Monitoring daily weights, I's and O's  Acute on CKD stage IIIa Holding hydrochlorothiazide and lisinopril in the setting of CKD Avoiding nephrotoxins  Elevated troponin Troponin initially 91, downtrending to 87 In the setting of CKD Likely demand ischemia in the setting of hypoxia Continue to monitor Denies having any chest pain  Hypertension Holding lisinopril hydrochlorothiazide, patient may end up needing IV as needed,  currently continuing Coreg,  nifedipine, Entresto      DVT Prophylaxis-   Heparin- SCDs    AM Labs Ordered, also please review Full Orders   Family Communication: Admission, patients condition and plan of care including tests being ordered have been discussed with the patient and mother who indicate understanding and agree with the plan and Code Status.   Code Status: Full   Admission status: Inpatient   ------------------------------------------------------------------------------------------------------------------------------- Cultures; Blood Cultures x 2 >> NGT Sputum Culture >> NGT   Antimicrobials: -Azithromycin, Rocephin ------------------------------------------------------------------------------------------------------------------------------  DVT prophylaxis:  SCD/Compression stockings and Heparin SQ Code Status:   Code Status: Full Code  Family Communication: No family member present at bedside- attempt will be made to update daily The above findings and plan of care has been discussed with patient (and family)  in detail,  they expressed understanding and agreement of above. -Advance care planning has been discussed.   Admission status:   Status is: Inpatient  Remains inpatient appropriate because:Hemodynamically unstable, Persistent severe electrolyte disturbances, IV treatments appropriate due to intensity of illness or inability to take PO, and Inpatient level of care appropriate due to severity of illness  Dispo: The patient is from: Home              Anticipated d/c is to: Home              Patient currently is not medically stable to d/c.   Difficult to place patient No      Level of care: Stepdown   Procedures:   No admission procedures for hospital encounter.    Antimicrobials:  Anti-infectives (From admission, onward)    Start     Dose/Rate Route Frequency Ordered Stop   04/11/21 0200  azithromycin (ZITHROMAX) 500 mg in sodium chloride 0.9 % 250 mL IVPB        500  mg 250 mL/hr over 60 Minutes Intravenous Every 24 hours 04/10/21 0511     04/11/21 0000  cefTRIAXone (ROCEPHIN) 1 g in sodium chloride 0.9 % 100 mL IVPB        1 g 200 mL/hr over 30 Minutes Intravenous Every 24 hours 04/10/21 0511     04/10/21 0200  azithromycin (ZITHROMAX) 500 mg in sodium chloride 0.9 % 250 mL IVPB        500 mg 250 mL/hr over 60 Minutes Intravenous  Once 04/10/21 0147 04/10/21 0313   04/10/21 0200  cefTRIAXone (ROCEPHIN) 1 g in sodium chloride 0.9 % 100 mL IVPB        1 g 200 mL/hr over 30 Minutes Intravenous  Once 04/10/21 0147 04/10/21 0242        Medication:   aspirin EC  81 mg Oral Daily   buPROPion  100 mg Oral BID   carvedilol  25 mg Oral BID WC   heparin  5,000 Units Subcutaneous Q8H   hydrALAZINE  100 mg Oral BID   insulin aspart  0-15 Units Subcutaneous TID WC   insulin aspart  0-5 Units Subcutaneous QHS   NIFEdipine  60 mg Oral Daily  rosuvastatin  20 mg Oral Daily   sacubitril-valsartan  1 tablet Oral BID   spironolactone  12.5 mg Oral Daily    acetaminophen **OR** acetaminophen, albuterol, ondansetron **OR** ondansetron (ZOFRAN) IV, oxyCODONE, traZODone   Objective:   Vitals:   04/10/21 0400 04/10/21 0545 04/10/21 0613 04/10/21 0723  BP: (!) 141/83 (!) 158/94  (!) 151/86  Pulse: 84 91 87 85  Resp: '16 20 16 18  '$ Temp:      TempSrc:      SpO2: 96% 96% 96% 97%  Weight:      Height:        Intake/Output Summary (Last 24 hours) at 04/10/2021 1134 Last data filed at 04/10/2021 [id number] Gross per 24 hour  Intake 1550 ml  Output 600 ml  Net 950 ml   Filed Weights   04/09/21 2131  Weight: 98.4 kg     Examination:   Physical Exam  Constitution:  Alert, cooperative, no distress,  Appears calm and comfortable  Psychiatric: Normal and stable mood and affect, cognition intact,   HEENT: Normocephalic, PERRL, otherwise with in Normal limits  Chest:Chest symmetric Cardio vascular:  S1/S2, RRR, No murmure, No Rubs or Gallops  pulmonary:  Clear to auscultation bilaterally, respirations unlabored, negative wheezes / crackles Abdomen: Soft, non-tender, non-distended, bowel sounds,no masses, no organomegaly Muscular skeletal: Limited exam - in bed, able to move all 4 extremities, Normal strength,  Neuro: CNII-XII intact. , normal motor and sensation, reflexes intact  Extremities: No pitting edema lower extremities, +2 pulses  Skin: Dry, warm to touch, negative for any Rashes, No open wounds Wounds: per nursing documentation    ---------------------------------------------------------------------------------------------------------------------------------    LABs:  CBC Latest Ref Rng & Units 04/10/2021 04/10/2021  WBC 4.0 - 10.5 K/uL 9.7 10.0  Hemoglobin 13.0 - 17.0 g/dL 11.5(L) 12.2(L)  Hematocrit 39.0 - 52.0 % 34.8(L) 36.7(L)  Platelets 150 - 400 K/uL 232 271   CMP Latest Ref Rng & Units 04/10/2021 04/10/2021 04/10/2021  Glucose 70 - 99 mg/dL 41(LL) 419(H) 534(HH)  BUN 6 - 20 mg/dL 44(H) 50(H) 54(H)  Creatinine 0.61 - 1.24 mg/dL 1.95(H) 2.04(H) 2.21(H)  Sodium 135 - 145 mmol/L 145 139 137  Potassium 3.5 - 5.1 mmol/L 3.5 3.5 3.6  Chloride 98 - 111 mmol/L 113(H) 109 104  CO2 22 - 32 mmol/L 32 23 25  Calcium 8.9 - 10.3 mg/dL 8.4(L) 8.1(L) 8.4(L)  Total Protein 6.5 - 8.1 g/dL - - 7.2  Total Bilirubin 0.3 - 1.2 mg/dL - - 0.9  Alkaline Phos 38 - 126 U/L - - 61  AST 15 - 41 U/L - - 21  ALT 0 - 44 U/L - - 22       Micro Results Recent Results (from the past 240 hour(s))  Resp Panel by RT-PCR (Flu A&B, Covid) Nasopharyngeal Swab     Status: None   Collection Time: 04/09/21 12:10 AM   Specimen: Nasopharyngeal Swab; Nasopharyngeal(NP) swabs in vial transport medium  Result Value Ref Range Status   SARS Coronavirus 2 by RT PCR NEGATIVE NEGATIVE Final    Comment: (NOTE) SARS-CoV-2 target nucleic acids are NOT DETECTED.  The SARS-CoV-2 RNA is generally detectable in upper respiratory specimens during the acute phase of  infection. The lowest concentration of SARS-CoV-2 viral copies this assay can detect is 138 copies/mL. A negative result does not preclude SARS-Cov-2 infection and should not be used as the sole basis for treatment or other patient management decisions. A negative result may occur with  improper specimen  collection/handling, submission of specimen other than nasopharyngeal swab, presence of viral mutation(s) within the areas targeted by this assay, and inadequate number of viral copies(<138 copies/mL). A negative result must be combined with clinical observations, patient history, and epidemiological information. The expected result is Negative.  Fact Sheet for Patients:  EntrepreneurPulse.com.au  Fact Sheet for Healthcare Providers:  IncredibleEmployment.be  This test is no t yet approved or cleared by the [place] FDA and  has been authorized for detection and/or diagnosis of SARS-CoV-2 by FDA under an Emergency Use Authorization (EUA). This EUA will remain  in effect (meaning this test can be used) for the duration of the COVID-19 declaration under Section 564(b)(1) of the Act, 21 U.S.C.section 360bbb-3(b)(1), unless the authorization is terminated  or revoked sooner.       Influenza A by PCR NEGATIVE NEGATIVE Final   Influenza B by PCR NEGATIVE NEGATIVE Final    Comment: (NOTE) The Xpert Xpress SARS-CoV-2/FLU/RSV plus assay is intended as an aid in the diagnosis of influenza from Nasopharyngeal swab specimens and should not be used as a sole basis for treatment. Nasal washings and aspirates are unacceptable for Xpert Xpress SARS-CoV-2/FLU/RSV testing.  Fact Sheet for Patients: EntrepreneurPulse.com.au  Fact Sheet for Healthcare Providers: IncredibleEmployment.be  This test is not yet approved or cleared by the [place] FDA and has been authorized for detection and/or diagnosis of SARS-CoV-2  by FDA under an Emergency Use Authorization (EUA). This EUA will remain in effect (meaning this test can be used) for the duration of the COVID-19 declaration under Section 564(b)(1) of the Act, 21 U.S.C. section 360bbb-3(b)(1), unless the authorization is terminated or revoked.  Performed at [facility], [address] [place], [place] [address]   Culture, blood (routine x 2)     Status: None (Preliminary result)   Collection Time: 04/10/21 12:09 AM   Specimen: Right Antecubital; Blood  Result Value Ref Range Status   Specimen Description RIGHT ANTECUBITAL  Final   Special Requests   Final    BOTTLES DRAWN AEROBIC AND ANAEROBIC Blood Culture results may not be optimal due to an excessive volume of blood received in culture bottles   Culture   Final    NO GROWTH < 12 HOURS Performed at [facility], [address] [place], [place] [address]    Report Status PENDING  Incomplete  Culture, blood (routine x 2)     Status: None (Preliminary result)   Collection Time: 04/10/21 12:09 AM   Specimen: Left Antecubital; Blood  Result Value Ref Range Status   Specimen Description LEFT ANTECUBITAL  Final   Special Requests   Final    BOTTLES DRAWN AEROBIC AND ANAEROBIC Blood Culture results may not be optimal due to an excessive volume of blood received in culture bottles   Culture   Final    NO GROWTH < 12 HOURS Performed at [facility], [address] [place],  [address]    Report Status PENDING  Incomplete    Radiology Reports DG CHEST PORT 1 VIEW  Result Date: 04/10/2021 CLINICAL DATA:  Weakness and fatigue. EXAM: PORTABLE CHEST 1 VIEW COMPARISON:  04/09/2021 FINDINGS: 0657 hours. The cardio pericardial silhouette is enlarged. Focal airspace disease again noted right base with some probable atelectasis or infiltrate in the retrocardiac left base. There is pulmonary vascular congestion without overt pulmonary edema. Superimposed component of interstitial pulmonary edema  suspected. IMPRESSION: Asymmetric focal airspace disease right base with some probable atelectasis or infiltrate in the retrocardiac left base.  Interstitial edema not excluded. Electronically Signed   By: Misty Stanley M.D.   On: 04/10/2021 07:33   DG Chest Port 1 View  Result Date: 04/09/2021 CLINICAL DATA:  Fever and weakness EXAM: PORTABLE CHEST 1 VIEW COMPARISON:  None. FINDINGS: The heart and mediastinal contours are within normal limits. Aortic calcification. Right lower lung zone focal consolidation. Question retrocardiac opacity. No pulmonary edema. Possible trace left pleural effusion. No right pleural effusion. No pneumothorax. No acute osseous abnormality. IMPRESSION: Right lower lung zone focal consolidation. Question retrocardiac opacity with possible trace left pleural effusion. Findings consistent with infection/inflammation. Followup PA and lateral chest X-ray is recommended in 3-4 weeks following therapy to ensure resolution and exclude underlying malignancy. Electronically Signed   By: Iven Finn M.D.   On: 04/09/2021 23:59    SIGNED: Deatra James, MD, FHM. [facility],  Pager (please use [email or web address] to page/text) Please use Epic Secure Chat for non-urgent communication (7AM-7PM)  If 7PM-7AM, please contact night-coverage [email or web address], 04/10/2021, 11:34 AM

## 2021-04-10 NOTE — ED Notes (Signed)
Family provided with update

## 2021-04-10 NOTE — Plan of Care (Signed)

## 2021-04-10 NOTE — ED Notes (Signed)
Dr. Roger Shelter notified of cbg elevation. Plan to stop D5 1/2NS, no insulin at this time

## 2021-04-11 DIAGNOSIS — N179 Acute kidney failure, unspecified: Secondary | ICD-10-CM | POA: Diagnosis not present

## 2021-04-11 DIAGNOSIS — Z794 Long term (current) use of insulin: Secondary | ICD-10-CM

## 2021-04-11 DIAGNOSIS — J9601 Acute respiratory failure with hypoxia: Secondary | ICD-10-CM | POA: Diagnosis not present

## 2021-04-11 DIAGNOSIS — I10 Essential (primary) hypertension: Secondary | ICD-10-CM | POA: Diagnosis not present

## 2021-04-11 DIAGNOSIS — R739 Hyperglycemia, unspecified: Secondary | ICD-10-CM

## 2021-04-11 DIAGNOSIS — E11649 Type 2 diabetes mellitus with hypoglycemia without coma: Secondary | ICD-10-CM

## 2021-04-11 LAB — BASIC METABOLIC PANEL
Anion gap: 10 (ref 5–15)
Anion gap: 9 (ref 5–15)
Anion gap: 11 (ref 5–15)
Anion gap: 5 (ref 5–15)
Anion gap: 5 (ref 5–15)
BUN: 36 mg/dL — ABNORMAL HIGH (ref 6–20)
BUN: 35 mg/dL — ABNORMAL HIGH (ref 6–20)
BUN: 36 mg/dL — ABNORMAL HIGH (ref 6–20)
BUN: 36 mg/dL — ABNORMAL HIGH (ref 6–20)
BUN: 39 mg/dL — ABNORMAL HIGH (ref 6–20)
CO2: 23 mmol/L (ref 22–32)
CO2: 24 mmol/L (ref 22–32)
CO2: 22 mmol/L (ref 22–32)
CO2: 26 mmol/L (ref 22–32)
CO2: 26 mmol/L (ref 22–32)
Calcium: 7.9 mg/dL — ABNORMAL LOW (ref 8.9–10.3)
Calcium: 8.2 mg/dL — ABNORMAL LOW (ref 8.9–10.3)
Calcium: 8.1 mg/dL — ABNORMAL LOW (ref 8.9–10.3)
Calcium: 7.8 mg/dL — ABNORMAL LOW (ref 8.9–10.3)
Calcium: 7.8 mg/dL — ABNORMAL LOW (ref 8.9–10.3)
Chloride: 111 mmol/L (ref 98–111)
Chloride: 111 mmol/L (ref 98–111)
Chloride: 109 mmol/L (ref 98–111)
Chloride: 110 mmol/L (ref 98–111)
Chloride: 110 mmol/L (ref 98–111)
Creatinine, Ser: 1.81 mg/dL — ABNORMAL HIGH (ref 0.61–1.24)
Creatinine, Ser: 1.75 mg/dL — ABNORMAL HIGH (ref 0.61–1.24)
Creatinine, Ser: 1.91 mg/dL — ABNORMAL HIGH (ref 0.61–1.24)
Creatinine, Ser: 1.88 mg/dL — ABNORMAL HIGH (ref 0.61–1.24)
Creatinine, Ser: 2.06 mg/dL — ABNORMAL HIGH (ref 0.61–1.24)
GFR, Estimated: 43 mL/min — ABNORMAL LOW (ref >60)
GFR, Estimated: 45 mL/min — ABNORMAL LOW (ref >60)
GFR, Estimated: 40 mL/min — ABNORMAL LOW (ref >60)
GFR, Estimated: 41 mL/min — ABNORMAL LOW (ref >60)
GFR, Estimated: 37 mL/min — ABNORMAL LOW (ref >60)
Glucose, Bld: 321 mg/dL — ABNORMAL HIGH (ref 70–99)
Glucose, Bld: 305 mg/dL — ABNORMAL HIGH (ref 70–99)
Glucose, Bld: 355 mg/dL — ABNORMAL HIGH (ref 70–99)
Glucose, Bld: 254 mg/dL — ABNORMAL HIGH (ref 70–99)
Glucose, Bld: 217 mg/dL — ABNORMAL HIGH (ref 70–99)
Potassium: 3.5 mmol/L (ref 3.5–5.1)
Potassium: 3.6 mmol/L (ref 3.5–5.1)
Potassium: 3.7 mmol/L (ref 3.5–5.1)
Potassium: 3.8 mmol/L (ref 3.5–5.1)
Potassium: 3.5 mmol/L (ref 3.5–5.1)
Sodium: 144 mmol/L (ref 135–145)
Sodium: 144 mmol/L (ref 135–145)
Sodium: 142 mmol/L (ref 135–145)
Sodium: 141 mmol/L (ref 135–145)
Sodium: 141 mmol/L (ref 135–145)

## 2021-04-11 LAB — URINE CULTURE: Culture: <10000 — AB

## 2021-04-11 LAB — PROCALCITONIN: Procalcitonin: 0.33 ng/mL

## 2021-04-11 LAB — GLUCOSE, CAPILLARY
Glucose-Capillary: 335 mg/dL — ABNORMAL HIGH (ref 70–99)
Glucose-Capillary: 314 mg/dL — ABNORMAL HIGH (ref 70–99)
Glucose-Capillary: 277 mg/dL — ABNORMAL HIGH (ref 70–99)
Glucose-Capillary: 207 mg/dL — ABNORMAL HIGH (ref 70–99)

## 2021-04-11 LAB — LACTIC ACID, PLASMA
Lactic Acid, Venous: 1.9 mmol/L (ref 0.5–1.9)
Lactic Acid, Venous: 1.5 mmol/L (ref 0.5–1.9)

## 2021-04-11 LAB — BRAIN NATRIURETIC PEPTIDE: B Natriuretic Peptide: 1112 pg/mL — ABNORMAL HIGH (ref 0.0–100.0)

## 2021-04-11 MED ORDER — ALBUMIN HUMAN 25 % IV SOLN
25.0000 g | Freq: Four times a day (QID) | INTRAVENOUS | Status: AC
Start: 2021-04-11 — End: 2021-04-11
  Administered 2021-04-11 (×2): 25 g via INTRAVENOUS
  Filled 2021-04-11 (×2): qty 100

## 2021-04-11 MED ORDER — HYDRALAZINE HCL 20 MG/ML IJ SOLN: 10.0000 mg | Freq: Four times a day (QID) | INTRAMUSCULAR | Status: DC | PRN

## 2021-04-11 MED ORDER — NIFEDIPINE ER OSMOTIC RELEASE 30 MG PO TB24
90.0000 mg | ORAL_TABLET | Freq: Every day | ORAL | Status: DC
  Administered 2021-04-11 – 2021-04-14 (×4): 90 mg via ORAL
  Filled 2021-04-11 (×4): qty 3

## 2021-04-11 MED ORDER — HYDRALAZINE HCL 25 MG PO TABS
100.0000 mg | ORAL_TABLET | Freq: Three times a day (TID) | ORAL | Status: DC
  Administered 2021-04-11 – 2021-05-01 (×61): 100 mg via ORAL
  Filled 2021-04-11 (×62): qty 4

## 2021-04-11 MED ORDER — INSULIN ASPART 100 UNIT/ML IJ SOLN
3.0000 [IU] | Freq: Once | INTRAMUSCULAR | Status: AC
  Administered 2021-04-11: 3 [IU] via SUBCUTANEOUS

## 2021-04-11 MED ORDER — POTASSIUM CHLORIDE CRYS ER 20 MEQ PO TBCR
20.0000 meq | EXTENDED_RELEASE_TABLET | Freq: Once | ORAL | Status: AC
  Administered 2021-04-11: 20 meq via ORAL
  Filled 2021-04-11: qty 1

## 2021-04-11 NOTE — Progress Notes (Signed)
PROGRESS NOTE    Patient: Keith Snow                            PCP: System, Provider Not In                    DOB: 16-May-1961            DOA: 04/09/2021 [id number]             DOS: 04/11/2021, 10:55 AM   LOS: 1 day   Date of Service: The patient was seen and examined on 04/11/2021  Subjective:   The patient was seen and examined this morning, much more awake alert, Much improved blood pressure, still tachypneic, satting 92% 4 L of oxygen, was successfully weaned off BiPAP overnight.  Brief Narrative:   Olin Ander  is a 60 y.o. male, with history of diabetes mellitus type 2, hypertension, myocardial infarction, CHF, and more presents ED with a chief complaint of altered mental status.  Patient remains somnolent noted for hypoglycemia, hypoxia.  Mother provided most of the history.  He has been sleeping continuously, having polyuria, and polydipsia.  She reports that it started during the night, and is continued to get worse.  He had decreased appetite and did not eat any breakfast.  Reporting of diabetes mellitus with insulin-dependent, also was on oxygen till recently for collapsed lung.    Mother reports that he does not smoke, he does not drink, he is vaccinated for COVID, he is full code.    ED: Slightly febrile 100.6, HR 82-95, RR 16, BP 173/100, satting  90s on 4 L nasal cannula WBC 10, hemoglobin 12.2, BUN 54, creatinine 2.21, initial sugar >500 BNP 1141, negative COVID, UA  > 500  negative ketones Chest x-ray shows a focal consolidation in the right lower lung that is infectious versus inflammatory Patient was given 10 units regular insulin, and Rocephin and Zithromax as well as 1 L normal saline VBG done on admission shows pH 7.37, PCO2 44, PO2 549, bicarb to 45 Admission was requested for further work-up of persistent tachycardia and tachypnea    Assessment & Plan:   Active Problems:   Acute respiratory failure with hypoxia (HCC)   Hyperglycemia   Type 2  diabetes mellitus with hypoglycemia without coma (HCC)   HTN (hypertension)   AKI (acute kidney injury) ([place])   Acute respiratory failure with hypoxia Multifactorial likely exacerbated by pneumonia, fluid retention, Has been weaned off BiPAP, to supplement O2 via nasal cannula, on 4 L now Goal to maintain O2 sat greater 92% Continue DuoNeb bronchodilators, treating underlying cause of pneumonia  Community Acquired Pneumonia  Chest x-ray was reviewed, T-max 100.6 most likely causative organism is strep pneumonia, febrile, improved blood pressure, We will continue current antibiotics of Rocephin and azithromycin Blood cultures, sputum cultures, and urine antigens pending Sepsis ruled out ... Lactic acid 1.9, procalcitonin [id number]   Acute metabolic encephalopathy Multifactorial: Likely due to hypoxia, hypoglycemia, with hypoxia, infection  Continue supplemental oxygen, continue antibiotics and aim for euglycemia Much improved  Hyperglycemia in the setting of diabetes mellitus type 2 Now patient is persistently hypoglycemic, dipped down to  CBG 41,... 305, 355 this a.m. On admission blood sugars were> 500, 10 units of regular insulin was given, now CBGs are dropping We will continue to monitor closely, as blood sugars improve, discontinue D5 half-normal saline drip Patient does take insulin at home, however dosages are unknown  Diabetic diet, checking CBG q. ACH S, with SSI coverage  CHF systolic versus diastolic BNP is elevated at 1441 >>> 1, 112.0 chest x-ray does not show vascular congestion or pulmonary edema Initiating IV Lasix 40 mg twice daily Requiring 4 L of oxygen maintaining O2 sat 90% Continue home medication of, continue aspirin, Coreg, hydralazine, Entresto, Aldactone Echocardiogram: Monitoring daily weights, I's and O's  Acute on CKD stage IIIa Creatinine on admission 1.82, 1.73, 1.75, 1.91 today BUN 39, 36, 35, 36 today, GFR 45-40 today Holding hydrochlorothiazide  and lisinopril in the setting of CKD Avoiding nephrotoxins Baseline creatinine unknown,  Elevated troponin Troponin initially 91, downtrending to 87 In the setting of CKD Likely demand ischemia in the setting of hypoxia Continue to monitor Denies having any chest pain  Hypertension Holding lisinopril hydrochlorothiazide, patient may end up needing IV as needed,  currently continuing Coreg, nifedipine, Entresto      DVT Prophylaxis-   Heparin- SCDs    AM Labs Ordered, also please review Full Orders   Family Communication: Admission, patients condition and plan of care including tests being ordered have been discussed with the patient and mother who indicate understanding and agree with the plan and Code Status.   Code Status: Full   Admission status: Inpatient   ------------------------------------------------------------------------------------------------------------------------------- Cultures; Blood Cultures x 2 >> NGT Sputum Culture >> NGT   Antimicrobials: -Azithromycin, Rocephin ------------------------------------------------------------------------------------------------------------------------------  DVT prophylaxis:  SCD/Compression stockings and Heparin SQ Code Status:   Code Status: Full Code  Family Communication: No family member present at bedside- attempt will be made to update daily The above findings and plan of care has been discussed with patient (and family)  in detail,  they expressed understanding and agreement of above. -Advance care planning has been discussed.   Admission status:   Status is: Inpatient  Remains inpatient appropriate because:Hemodynamically unstable, Persistent severe electrolyte disturbances, IV treatments appropriate due to intensity of illness or inability to take PO, and Inpatient level of care appropriate due to severity of illness  Dispo: The patient is from: Home              Anticipated d/c is to: Home               Patient currently is not medically stable to d/c.   Difficult to place patient No      Level of care: Stepdown   Procedures:   No admission procedures for hospital encounter.    Antimicrobials:  Anti-infectives (From admission, onward)    Start     Dose/Rate Route Frequency Ordered Stop   04/11/21 0200  azithromycin (ZITHROMAX) 500 mg in sodium chloride 0.9 % 250 mL IVPB        500 mg 250 mL/hr over 60 Minutes Intravenous Every 24 hours 04/10/21 0511     04/11/21 0000  cefTRIAXone (ROCEPHIN) 1 g in sodium chloride 0.9 % 100 mL IVPB        1 g 200 mL/hr over 30 Minutes Intravenous Every 24 hours 04/10/21 0511     04/10/21 0200  azithromycin (ZITHROMAX) 500 mg in sodium chloride 0.9 % 250 mL IVPB        500 mg 250 mL/hr over 60 Minutes Intravenous  Once 04/10/21 0147 04/10/21 0313   04/10/21 0200  cefTRIAXone (ROCEPHIN) 1 g in sodium chloride 0.9 % 100 mL IVPB        1 g 200 mL/hr over 30 Minutes Intravenous  Once 04/10/21 0147 04/10/21 0242  Medication:   aspirin EC  81 mg Oral Daily   buPROPion  100 mg Oral BID   carvedilol  25 mg Oral BID WC   Chlorhexidine Gluconate Cloth  6 each Topical Daily   furosemide  40 mg Intravenous Q12H   heparin  5,000 Units Subcutaneous Q8H   hydrALAZINE  100 mg Oral Q8H   insulin aspart  0-15 Units Subcutaneous TID WC   insulin aspart  0-5 Units Subcutaneous QHS   NIFEdipine  90 mg Oral Daily   rosuvastatin  20 mg Oral Daily   sacubitril-valsartan  1 tablet Oral BID   spironolactone  12.5 mg Oral Daily    acetaminophen **OR** acetaminophen, albuterol, hydrALAZINE, labetalol, ondansetron **OR** ondansetron (ZOFRAN) IV, oxyCODONE, traZODone   Objective:   Vitals:   04/11/21 0700 04/11/21 0730 04/11/21 0800 04/11/21 0900  BP: (!) 191/92  (!) 175/65 (!) 147/78  Pulse: 91  96 82  Resp: 18  (!) 26 (!) 24  Temp:  98.3 F (36.8 C)    TempSrc:  Oral    SpO2: 96%  90% 92%  Weight:      Height:        Intake/Output  Summary (Last 24 hours) at 04/11/2021 1055 Last data filed at 04/11/2021 0400 Gross per 24 hour  Intake 718.11 ml  Output 1900 ml  Net -1181.89 ml   Filed Weights   04/09/21 2131 04/10/21 1456 04/11/21 0440  Weight: 98.4 kg 100 kg 98.9 kg     Examination:      Physical Exam:   General:  Improved mentation, awake alert, following commands  HEENT:  Normocephalic, PERRL, otherwise with in Normal limits   Neuro:  CNII-XII intact. , normal motor and sensation, reflexes intact   Lungs:   Shortness of breath, on 4 L of oxygen, mild-moderate respiratory distress with mildly labored breathing, diffuse rhonchi, mild crackles mid to lower lobe,  Cardio:    S1/S2, RRR, No murmure, No Rubs or Gallops   Abdomen:   Soft, non-tender, bowel sounds active all four quadrants,  no guarding or peritoneal signs.  Muscular skeletal:  Limited exam - in bed, able to move all 4 extremities, global generalized weaknesses 2+ pulses,  symmetric, +1 pitting edema  Skin:  Dry, warm to touch, negative for any Rashes,  Wounds: Please see nursing documentation         ---------------------------------------------------------------------------------------------------------------------------------    LABs:  CBC Latest Ref Rng & Units 04/10/2021 04/10/2021  WBC 4.0 - 10.5 K/uL 9.7 10.0  Hemoglobin 13.0 - 17.0 g/dL 11.5(L) 12.2(L)  Hematocrit 39.0 - 52.0 % 34.8(L) 36.7(L)  Platelets 150 - 400 K/uL 232 271   CMP Latest Ref Rng & Units 04/11/2021 04/11/2021 04/11/2021  Glucose 70 - 99 mg/dL 355(H) 305(H) 321(H)  BUN 6 - 20 mg/dL 36(H) 35(H) 36(H)  Creatinine 0.61 - 1.24 mg/dL 1.91(H) 1.75(H) 1.81(H)  Sodium 135 - 145 mmol/L 142 144 144  Potassium 3.5 - 5.1 mmol/L 3.7 3.6 3.5  Chloride 98 - 111 mmol/L 109 111 111  CO2 22 - 32 mmol/L '22 24 23  '$ Calcium 8.9 - 10.3 mg/dL 8.1(L) 8.2(L) 7.9(L)  Total Protein 6.5 - 8.1 g/dL - - -  Total Bilirubin 0.3 - 1.2 mg/dL - - -  Alkaline Phos 38 - 126 U/L - - -  AST 15 - 41  U/L - - -  ALT 0 - 44 U/L - - -       Micro Results Recent Results (from the past  240 hour(s))  Urine Culture     Status: Abnormal   Collection Time: 04/09/21 12:07 AM   Specimen: Urine, Clean Catch  Result Value Ref Range Status   Specimen Description   Final    URINE, CLEAN CATCH Performed at [facility], [address], [place], [place] [address]    Special Requests   Final    NONE Performed at [facility], [address], [place], [place] [address]    Culture (A)  Final    <10,000 COLONIES/mL INSIGNIFICANT GROWTH Performed at [facility] [address] [place], [place] [address]    Report Status 04/11/2021 FINAL  Final  Resp Panel by RT-PCR (Flu A&B, Covid) Nasopharyngeal Swab     Status: None   Collection Time: 04/09/21 12:10 AM   Specimen: Nasopharyngeal Swab; Nasopharyngeal(NP) swabs in vial transport medium  Result Value Ref Range Status   SARS Coronavirus 2 by RT PCR NEGATIVE NEGATIVE Final    Comment: (NOTE) SARS-CoV-2 target nucleic acids are NOT DETECTED.  The SARS-CoV-2 RNA is generally detectable in upper respiratory specimens during the acute phase of infection. The lowest concentration of SARS-CoV-2 viral copies this assay can detect is 138 copies/mL. A negative result does not preclude SARS-Cov-2 infection and should not be used as the sole basis for treatment or other patient management decisions. A negative result may occur with  improper specimen collection/handling, submission of specimen other than nasopharyngeal swab, presence of viral mutation(s) within the areas targeted by this assay, and inadequate number of viral copies(<138 copies/mL). A negative result must be combined with clinical observations, patient history, and epidemiological information. The expected result is Negative.  Fact Sheet for Patients:  EntrepreneurPulse.com.au  Fact Sheet for Healthcare Providers:   IncredibleEmployment.be  This test is no t yet approved or cleared by the [place] FDA and  has been authorized for detection and/or diagnosis of SARS-CoV-2 by FDA under an Emergency Use Authorization (EUA). This EUA will remain  in effect (meaning this test can be used) for the duration of the COVID-19 declaration under Section 564(b)(1) of the Act, 21 U.S.C.section 360bbb-3(b)(1), unless the authorization is terminated  or revoked sooner.       Influenza A by PCR NEGATIVE NEGATIVE Final   Influenza B by PCR NEGATIVE NEGATIVE Final    Comment: (NOTE) The Xpert Xpress SARS-CoV-2/FLU/RSV plus assay is intended as an aid in the diagnosis of influenza from Nasopharyngeal swab specimens and should not be used as a sole basis for treatment. Nasal washings and aspirates are unacceptable for Xpert Xpress SARS-CoV-2/FLU/RSV testing.  Fact Sheet for Patients: EntrepreneurPulse.com.au  Fact Sheet for Healthcare Providers: IncredibleEmployment.be  This test is not yet approved or cleared by the [place] FDA and has been authorized for detection and/or diagnosis of SARS-CoV-2 by FDA under an Emergency Use Authorization (EUA). This EUA will remain in effect (meaning this test can be used) for the duration of the COVID-19 declaration under Section 564(b)(1) of the Act, 21 U.S.C. section 360bbb-3(b)(1), unless the authorization is terminated or revoked.  Performed at [facility], [address] [place], [place] [address]   Culture, blood (routine x 2)     Status: None (Preliminary result)   Collection Time: 04/10/21 12:09 AM   Specimen: Right Antecubital; Blood  Result Value Ref Range Status   Specimen Description RIGHT ANTECUBITAL  Final   Special Requests   Final    BOTTLES DRAWN AEROBIC AND ANAEROBIC Blood Culture results may not be optimal due  to an excessive volume of blood received in culture bottles   Culture    Final    NO GROWTH 1 DAY Performed at [facility], [address], [place], [place] [address]    Report Status PENDING  Incomplete  Culture, blood (routine x 2)     Status: None (Preliminary result)   Collection Time: 04/10/21 12:09 AM   Specimen: Left Antecubital; Blood  Result Value Ref Range Status   Specimen Description LEFT ANTECUBITAL  Final   Special Requests   Final    BOTTLES DRAWN AEROBIC AND ANAEROBIC Blood Culture results may not be optimal due to an excessive volume of blood received in culture bottles   Culture   Final    NO GROWTH 1 DAY Performed at [facility], [address] [place], [place] [address]    Report Status PENDING  Incomplete  MRSA Next Gen by PCR, Nasal     Status: None   Collection Time: 04/10/21  2:47 PM   Specimen: Nasal Mucosa; Nasal Swab  Result Value Ref Range Status   MRSA by PCR Next Gen NOT DETECTED NOT DETECTED Final    Comment: (NOTE) The GeneXpert MRSA Assay (FDA approved for NASAL specimens only), is one component of a comprehensive MRSA colonization surveillance program. It is not intended to diagnose MRSA infection nor to guide or monitor treatment for MRSA infections. Test performance is not FDA approved in patients less than 46 years old. Performed at [facility], [address] [place], [place] [address]     Radiology Reports DG CHEST PORT 1 VIEW  Result Date: 04/10/2021 CLINICAL DATA:  Weakness and fatigue. EXAM: PORTABLE CHEST 1 VIEW COMPARISON:  04/09/2021 FINDINGS: 0657 hours. The cardio pericardial silhouette is enlarged. Focal airspace disease again noted right base with some probable atelectasis or infiltrate in the retrocardiac left base. There is pulmonary vascular congestion without overt pulmonary edema. Superimposed component of interstitial pulmonary edema suspected. IMPRESSION: Asymmetric focal airspace disease right base with some probable atelectasis or infiltrate in the retrocardiac left base. Interstitial edema not  excluded. Electronically Signed   By: Misty Stanley M.D.   On: 04/10/2021 07:33   DG Chest Port 1 View  Result Date: 04/09/2021 CLINICAL DATA:  Fever and weakness EXAM: PORTABLE CHEST 1 VIEW COMPARISON:  None. FINDINGS: The heart and mediastinal contours are within normal limits. Aortic calcification. Right lower lung zone focal consolidation. Question retrocardiac opacity. No pulmonary edema. Possible trace left pleural effusion. No right pleural effusion. No pneumothorax. No acute osseous abnormality. IMPRESSION: Right lower lung zone focal consolidation. Question retrocardiac opacity with possible trace left pleural effusion. Findings consistent with infection/inflammation. Followup PA and lateral chest X-ray is recommended in 3-4 weeks following therapy to ensure resolution and exclude underlying malignancy. Electronically Signed   By: Iven Finn M.D.   On: 04/09/2021 23:59   ECHOCARDIOGRAM COMPLETE  Result Date: 04/10/2021    ECHOCARDIOGRAM REPORT   Patient Name:   WHITLEY Overacker Date of Exam: 04/10/2021 Medical Rec #:  [id number]    Height:       69.0 in Accession #:    [id number]   Weight:       217.0 lb Date of Birth:  21-Oct-1960    BSA:          2.139 m Patient Age:    69 years     BP:           142/83 mmHg Patient Gender: M  HR:           85 bpm. Exam Location:  [facility] Procedure: 2D Echo, Cardiac Doppler and Color Doppler Indications:    SBE I33.9  History:        Patient has no prior history of Echocardiogram examinations.                 Previous Myocardial Infarction, Stroke; Risk                 Factors:Hypertension and Diabetes. Hx of cancer. 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  Sonographer:    Alvino Chapel RCS Referring Phys: [place]  1. Left ventricular ejection fraction, by estimation, is 55 to 60%. The left ventricle has normal function. The left ventricle has no regional wall motion abnormalities. There is moderate  concentric left ventricular hypertrophy. Left ventricular diastolic parameters are consistent with Grade II diastolic dysfunction (pseudonormalization).  2. Right ventricular systolic function is normal. The right ventricular size is normal. There is normal pulmonary artery systolic pressure. The estimated right ventricular systolic pressure is 123XX123 mmHg.  3. Left atrial size was moderately dilated.  4. Right atrial size was mildly dilated.  5. The mitral valve is grossly normal. No evidence of mitral valve regurgitation. No evidence of mitral stenosis.  6. The aortic valve is tricuspid. Aortic valve regurgitation is not visualized. No aortic stenosis is present.  7. The inferior vena cava is normal in size with greater than 50% respiratory variability, suggesting right atrial pressure of 3 mmHg. FINDINGS  Left Ventricle: Left ventricular ejection fraction, by estimation, is 55 to 60%. The left ventricle has normal function. The left ventricle has no regional wall motion abnormalities. The left ventricular internal cavity size was normal in size. There is  moderate concentric left ventricular hypertrophy. Left ventricular diastolic parameters are consistent with Grade II diastolic dysfunction (pseudonormalization). Right Ventricle: The right ventricular size is normal. No increase in right ventricular wall thickness. Right ventricular systolic function is normal. There is normal pulmonary artery systolic pressure. The tricuspid regurgitant velocity is 2.10 m/s, and  with an assumed right atrial pressure of 3 mmHg, the estimated right ventricular systolic pressure is 123XX123 mmHg. Left Atrium: Left atrial size was moderately dilated. Right Atrium: Right atrial size was mildly dilated. Pericardium: There is no evidence of pericardial effusion. Mitral Valve: The mitral valve is grossly normal. No evidence of mitral valve regurgitation. No evidence of mitral valve stenosis. Tricuspid Valve: The tricuspid valve is grossly  normal. Tricuspid valve regurgitation is trivial. No evidence of tricuspid stenosis. Aortic Valve: The aortic valve is tricuspid. Aortic valve regurgitation is not visualized. No aortic stenosis is present. Pulmonic Valve: The pulmonic valve was grossly normal. Pulmonic valve regurgitation is not visualized. No evidence of pulmonic stenosis. Aorta: The aortic root is normal in size and structure. Venous: The inferior vena cava is normal in size with greater than 50% respiratory variability, suggesting right atrial pressure of 3 mmHg. IAS/Shunts: The atrial septum is grossly normal.  LEFT VENTRICLE PLAX 2D LVIDd:         4.70 cm  Diastology LVIDs:         3.10 cm  LV e' medial:    6.74 cm/s LV PW:         1.50 cm  LV E/e' medial:  16.5 LV IVS:  1.50 cm  LV e' lateral:   9.14 cm/s LVOT diam:     2.00 cm  LV E/e' lateral: 12.1 LV SV:         65 LV SV Index:   31 LVOT Area:     3.14 cm  RIGHT VENTRICLE RV S prime:     13.80 cm/s TAPSE (M-mode): 2.6 cm LEFT ATRIUM             Index       RIGHT ATRIUM           Index LA diam:        4.20 cm 1.96 cm/m  RA Area:     24.30 cm LA Vol (A2C):   79.4 ml 37.12 ml/m RA Volume:   81.40 ml  38.06 ml/m LA Vol (A4C):   93.6 ml 43.76 ml/m LA Biplane Vol: 86.2 ml 40.30 ml/m  AORTIC VALVE LVOT Vmax:   102.00 cm/s LVOT Vmean:  62.500 cm/s LVOT VTI:    0.208 m  AORTA Ao Root diam: 3.60 cm MITRAL VALVE                TRICUSPID VALVE MV Area (PHT): 4.15 cm     TR Peak grad:   17.6 mmHg MV Decel Time: 183 msec     TR Vmax:        210.00 cm/s MV E velocity: 111.00 cm/s MV A velocity: 79.70 cm/s   SHUNTS MV E/A ratio:  1.39         Systemic VTI:  0.21 m                             Systemic Diam: 2.00 cm Eleonore Chiquito MD Electronically signed by Eleonore Chiquito MD Signature Date/Time: 04/10/2021/1:25:55 PM    Final     SIGNED: Deatra James, MD, FHM. [facility],  Pager (please use [email or web address] to page/text) Please use Epic Secure Chat for non-urgent communication  (7AM-7PM)  If 7PM-7AM, please contact night-coverage [email or web address], 04/11/2021, 10:55 AM > 66 minutes of critical care time was spent in evaluating and examined the patient, reviewing all medical records, labs, medications, drawn plan of care, discussing care with patient and ICU, and respiratory nursing staff team.

## 2021-04-11 NOTE — Progress Notes (Signed)
Patient has been on and off Bipap throughout the day. Pt still having episodes of SOB and tachypnea.

## 2021-04-11 NOTE — Progress Notes (Signed)
Wife listed in contacts requested Dr to contact her with update sometime today.

## 2021-04-12 ENCOUNTER — Inpatient Hospital Stay (HOSPITAL_COMMUNITY): Payer: Medicare Other

## 2021-04-12 DIAGNOSIS — E11649 Type 2 diabetes mellitus with hypoglycemia without coma: Secondary | ICD-10-CM | POA: Diagnosis not present

## 2021-04-12 DIAGNOSIS — I5033 Acute on chronic diastolic (congestive) heart failure: Secondary | ICD-10-CM

## 2021-04-12 DIAGNOSIS — I10 Essential (primary) hypertension: Secondary | ICD-10-CM | POA: Diagnosis not present

## 2021-04-12 DIAGNOSIS — J9601 Acute respiratory failure with hypoxia: Secondary | ICD-10-CM | POA: Diagnosis not present

## 2021-04-12 DIAGNOSIS — N179 Acute kidney failure, unspecified: Secondary | ICD-10-CM | POA: Diagnosis not present

## 2021-04-12 LAB — BASIC METABOLIC PANEL
Anion gap: 12 (ref 5–15)
Anion gap: 12 (ref 5–15)
Anion gap: 4 — ABNORMAL LOW (ref 5–15)
Anion gap: 6 (ref 5–15)
Anion gap: 7 (ref 5–15)
Anion gap: 8 (ref 5–15)
Anion gap: 9 (ref 5–15)
BUN: 42 mg/dL — ABNORMAL HIGH (ref 6–20)
BUN: 44 mg/dL — ABNORMAL HIGH (ref 6–20)
BUN: 45 mg/dL — ABNORMAL HIGH (ref 6–20)
BUN: 51 mg/dL — ABNORMAL HIGH (ref 6–20)
BUN: 53 mg/dL — ABNORMAL HIGH (ref 6–20)
BUN: 54 mg/dL — ABNORMAL HIGH (ref 6–20)
BUN: 58 mg/dL — ABNORMAL HIGH (ref 6–20)
CO2: 20 mmol/L — ABNORMAL LOW (ref 22–32)
CO2: 21 mmol/L — ABNORMAL LOW (ref 22–32)
CO2: 23 mmol/L (ref 22–32)
CO2: 23 mmol/L (ref 22–32)
CO2: 23 mmol/L (ref 22–32)
CO2: 27 mmol/L (ref 22–32)
CO2: 27 mmol/L (ref 22–32)
Calcium: 7.3 mg/dL — ABNORMAL LOW (ref 8.9–10.3)
Calcium: 7.3 mg/dL — ABNORMAL LOW (ref 8.9–10.3)
Calcium: 7.6 mg/dL — ABNORMAL LOW (ref 8.9–10.3)
Calcium: 7.6 mg/dL — ABNORMAL LOW (ref 8.9–10.3)
Calcium: 7.6 mg/dL — ABNORMAL LOW (ref 8.9–10.3)
Calcium: 7.7 mg/dL — ABNORMAL LOW (ref 8.9–10.3)
Calcium: 7.9 mg/dL — ABNORMAL LOW (ref 8.9–10.3)
Chloride: 107 mmol/L (ref 98–111)
Chloride: 108 mmol/L (ref 98–111)
Chloride: 109 mmol/L (ref 98–111)
Chloride: 109 mmol/L (ref 98–111)
Chloride: 109 mmol/L (ref 98–111)
Chloride: 109 mmol/L (ref 98–111)
Chloride: 109 mmol/L (ref 98–111)
Creatinine, Ser: 2.37 mg/dL — ABNORMAL HIGH (ref 0.61–1.24)
Creatinine, Ser: 2.52 mg/dL — ABNORMAL HIGH (ref 0.61–1.24)
Creatinine, Ser: 2.63 mg/dL — ABNORMAL HIGH (ref 0.61–1.24)
Creatinine, Ser: 2.85 mg/dL — ABNORMAL HIGH (ref 0.61–1.24)
Creatinine, Ser: 2.86 mg/dL — ABNORMAL HIGH (ref 0.61–1.24)
Creatinine, Ser: 2.91 mg/dL — ABNORMAL HIGH (ref 0.61–1.24)
Creatinine, Ser: 2.92 mg/dL — ABNORMAL HIGH (ref 0.61–1.24)
GFR, Estimated: 24 mL/min — ABNORMAL LOW (ref >60)
GFR, Estimated: 24 mL/min — ABNORMAL LOW (ref >60)
GFR, Estimated: 25 mL/min — ABNORMAL LOW (ref >60)
GFR, Estimated: 25 mL/min — ABNORMAL LOW (ref >60)
GFR, Estimated: 27 mL/min — ABNORMAL LOW (ref >60)
GFR, Estimated: 29 mL/min — ABNORMAL LOW (ref >60)
GFR, Estimated: 31 mL/min — ABNORMAL LOW (ref >60)
Glucose, Bld: 176 mg/dL — ABNORMAL HIGH (ref 70–99)
Glucose, Bld: 252 mg/dL — ABNORMAL HIGH (ref 70–99)
Glucose, Bld: 262 mg/dL — ABNORMAL HIGH (ref 70–99)
Glucose, Bld: 277 mg/dL — ABNORMAL HIGH (ref 70–99)
Glucose, Bld: 298 mg/dL — ABNORMAL HIGH (ref 70–99)
Glucose, Bld: 310 mg/dL — ABNORMAL HIGH (ref 70–99)
Glucose, Bld: 312 mg/dL — ABNORMAL HIGH (ref 70–99)
Potassium: 3.5 mmol/L (ref 3.5–5.1)
Potassium: 3.6 mmol/L (ref 3.5–5.1)
Potassium: 3.6 mmol/L (ref 3.5–5.1)
Potassium: 3.7 mmol/L (ref 3.5–5.1)
Potassium: 3.7 mmol/L (ref 3.5–5.1)
Potassium: 3.7 mmol/L (ref 3.5–5.1)
Potassium: 3.8 mmol/L (ref 3.5–5.1)
Sodium: 138 mmol/L (ref 135–145)
Sodium: 140 mmol/L (ref 135–145)
Sodium: 140 mmol/L (ref 135–145)
Sodium: 140 mmol/L (ref 135–145)
Sodium: 141 mmol/L (ref 135–145)
Sodium: 141 mmol/L (ref 135–145)
Sodium: 142 mmol/L (ref 135–145)

## 2021-04-12 LAB — GLUCOSE, CAPILLARY
Glucose-Capillary: 209 mg/dL — ABNORMAL HIGH (ref 70–99)
Glucose-Capillary: 331 mg/dL — ABNORMAL HIGH (ref 70–99)
Glucose-Capillary: 276 mg/dL — ABNORMAL HIGH (ref 70–99)
Glucose-Capillary: 304 mg/dL — ABNORMAL HIGH (ref 70–99)
Glucose-Capillary: 180 mg/dL — ABNORMAL HIGH (ref 70–99)

## 2021-04-12 LAB — LEGIONELLA PNEUMOPHILA SEROGP 1 UR AG: L. pneumophila Serogp 1 Ur Ag: NEGATIVE

## 2021-04-12 LAB — BRAIN NATRIURETIC PEPTIDE: B Natriuretic Peptide: 1110 pg/mL — ABNORMAL HIGH (ref 0.0–100.0)

## 2021-04-12 MED ORDER — PANTOPRAZOLE SODIUM 40 MG PO TBEC
40.0000 mg | DELAYED_RELEASE_TABLET | Freq: Every day | ORAL | Status: DC
  Administered 2021-04-12 – 2021-05-01 (×20): 40 mg via ORAL
  Filled 2021-04-12 (×21): qty 1

## 2021-04-12 MED ORDER — IPRATROPIUM-ALBUTEROL 0.5-2.5 (3) MG/3ML IN SOLN
3.0000 mL | RESPIRATORY_TRACT | Status: DC | PRN
  Administered 2021-04-12 – 2021-04-27 (×3): 3 mL via RESPIRATORY_TRACT
  Filled 2021-04-12 (×2): qty 3

## 2021-04-12 MED ORDER — IPRATROPIUM-ALBUTEROL 0.5-2.5 (3) MG/3ML IN SOLN
3.0000 mL | Freq: Four times a day (QID) | RESPIRATORY_TRACT | Status: DC
  Administered 2021-04-12 – 2021-04-29 (×64): 3 mL via RESPIRATORY_TRACT
  Filled 2021-04-12 (×66): qty 3

## 2021-04-12 MED ORDER — INSULIN GLARGINE-YFGN 100 UNIT/ML ~~LOC~~ SOLN
20.0000 [IU] | Freq: Every day | SUBCUTANEOUS | Status: DC
  Administered 2021-04-12 – 2021-04-13 (×2): 20 [IU] via SUBCUTANEOUS
  Filled 2021-04-12 (×3): qty 0.2

## 2021-04-12 MED ORDER — SENNOSIDES-DOCUSATE SODIUM 8.6-50 MG PO TABS: 1.0000 | ORAL_TABLET | Freq: Every evening | ORAL | Status: DC | PRN

## 2021-04-12 NOTE — Progress Notes (Signed)
PROGRESS NOTE    Keith Snow  [id number] DOB: 29-Aug-1960 DOA: 04/09/2021 PCP: System, Provider Not In   Brief Narrative:  60 year old with history of DM2, HTN, MI, CHF comes to the ED with change in mental status.  Upon admission he was noted to have focal consolidation in the right lower lung, elevated BNP.  He was also hyperglycemic.   Assessment & Plan:   Active Problems:   Acute respiratory failure with hypoxia (HCC)   Hyperglycemia   Type 2 diabetes mellitus with hypoglycemia without coma (HCC)   HTN (hypertension)   AKI (acute kidney injury) ([place])  Acute hypoxic respiratory failure requiring BiPAP - Multifactorial.  Combination of COPD and CHF exacerbation. -Obtain CT chest without contrast to get better parenchymal definition  Community-acquired pneumonia - Procalcitonin 0.33.  Follow-up culture data - Empiric Rocephin and azithromycin - Bronchodilators scheduled and as needed.  I-S/flutter - Out of bed to chair  Acute metabolic encephalopathy - Suspect secondary to underlying infection.  No focal neurodeficits.  CT head-  Diabetes mellitus type 2, uncontrolled with hyperglycemia - A1c-10.3 - Long-acting insulin 20 units daily - Sliding scale and Accu-Chek  Acute congestive heart failure with preserved EF, grade 2 DD, class III -Echo 60%, grade 2 DD, moderate LVH - Due to rising renal function, hold Lasix, Aldactone and Entresto today Cardiology following.   Acute kidney injury on CKD stage IIIa - Admission creatinine 1.8, today is 2.63 - Bladder scan, straight cath if greater than 300  Elevated troponin - Likely demand ischemia.  No acute changes on EKG.  Essential hypertension -Coreg, nifedipine.  IV hydralazine  Hyperlipidemia - Crestor    DVT prophylaxis: Subcu heparin Code Status: Full code Family Communication: Called his wife, no answer  Status is: Inpatient  Remains inpatient appropriate because:Inpatient level of care appropriate due  to severity of illness  Dispo: The patient is from: Home              Anticipated d/c is to: Home              Patient currently is not medically stable to d/c.   Difficult to place patient No       Nutritional status           Body mass index is 32.1 kg/m.           Subjective: Seen and examined at bedside, was having tachypnea therefore placed on BiPAP.  But overall his lungs are fairly clear  Review of Systems Otherwise negative except as per HPI, including: General: Denies fever, chills, night sweats or unintended weight loss. Resp: Denies cough, wheezing Cardiac: Denies chest pain, palpitations, orthopnea, paroxysmal nocturnal dyspnea. GI: Denies abdominal pain, nausea, vomiting, diarrhea or constipation GU: Denies dysuria, frequency, hesitancy or incontinence MS: Denies muscle aches, joint pain or swelling Neuro: Denies headache, neurologic deficits (focal weakness, numbness, tingling), abnormal gait Psych: Denies anxiety, depression, SI/HI/AVH Skin: Denies new rashes or lesions ID: Denies sick contacts, exotic exposures, travel  Examination:  General exam: Appears calm and comfortable on BiPAP Respiratory system: Clear to auscultation. Respiratory effort normal. Cardiovascular system: S1 & S2 heard, RRR. No JVD, murmurs, rubs, gallops or clicks. No pedal edema. Gastrointestinal system: Abdomen is nondistended, soft and nontender. No organomegaly or masses felt. Normal bowel sounds heard. Central nervous system: Alert and oriented. No focal neurological deficits. Extremities: Symmetric 5 x 5 power. Skin: No rashes, lesions or ulcers Psychiatry: Judgement and insight appear normal. Mood & affect appropriate.  Objective: Vitals:   04/12/21 0500 04/12/21 0600 04/12/21 0700 04/12/21 0714  BP: 116/65 125/63 125/61   Pulse: 79 82 80 81  Resp: '16 14 15 11  '$ Temp:    98.1 F (36.7 C)  TempSrc:    Axillary  SpO2: 93% 94% 95% 93%  Weight:       Height:        Intake/Output Summary (Last 24 hours) at 04/12/2021 0757 Last data filed at 04/11/2021 2300 Gross per 24 hour  Intake 760.44 ml  Output 1100 ml  Net -339.56 ml   Filed Weights   04/10/21 1456 04/11/21 0440 04/12/21 0438  Weight: 100 kg 98.9 kg 98.6 kg     Data Reviewed:   CBC: Recent Labs  Lab 04/10/21 0009 04/10/21 0611  WBC 10.0 9.7  NEUTROABS 8.2*  --   HGB 12.2* 11.5*  HCT 36.7* 34.8*  MCV 92.4 94.1  PLT 271 [id number]   Basic Metabolic Panel: Recent Labs  Lab 04/11/21 1411 04/11/21 1916 04/11/21 2334 04/12/21 0300 04/12/21 0434  NA 141 141 140 141 142  K 3.8 3.5 3.6 3.7 3.6  CL 110 110 109 109 109  CO2 '26 26 23 23 '$ 21*  GLUCOSE 254* 217* 262* 277* 298*  BUN 36* 39* 42* 44* 45*  CREATININE 1.88* 2.06* 2.37* 2.52* 2.63*  CALCIUM 7.8* 7.8* 7.6* 7.9* 7.6*   GFR: Estimated Creatinine Clearance: 35.5 mL/min (A) (by C-G formula based on SCr of 2.63 mg/dL (H)). Liver Function Tests: Recent Labs  Lab 04/10/21 0009  AST 21  ALT 22  ALKPHOS 61  BILITOT 0.9  PROT 7.2  ALBUMIN 3.0*   No results for input(s): LIPASE, AMYLASE in the last 168 hours. No results for input(s): AMMONIA in the last 168 hours. Coagulation Profile: No results for input(s): INR, PROTIME in the last 168 hours. Cardiac Enzymes: No results for input(s): CKTOTAL, CKMB, CKMBINDEX, TROPONINI in the last 168 hours. BNP (last 3 results) No results for input(s): PROBNP in the last 8760 hours. HbA1C: Recent Labs    04/10/21 0009  HGBA1C 10.3*   CBG: Recent Labs  Lab 04/11/21 0802 04/11/21 1109 04/11/21 1630 04/11/21 2136 04/12/21 0713  GLUCAP 314* 277* 207* 209* 331*   Lipid Profile: No results for input(s): CHOL, HDL, LDLCALC, TRIG, CHOLHDL, LDLDIRECT in the last 72 hours. Thyroid Function Tests: No results for input(s): TSH, T4TOTAL, FREET4, T3FREE, THYROIDAB in the last 72 hours. Anemia Panel: No results for input(s): VITAMINB12, FOLATE, FERRITIN, TIBC, IRON,  RETICCTPCT in the last 72 hours. Sepsis Labs: Recent Labs  Lab 04/10/21 0009 04/10/21 0215 04/11/21 0608 04/11/21 0932 04/11/21 1132  PROCALCITON  --   --  0.33  --   --   LATICACIDVEN 1.6 1.6  --  1.9 1.5    Recent Results (from the past 240 hour(s))  Urine Culture     Status: Abnormal   Collection Time: 04/09/21 12:07 AM   Specimen: Urine, Clean Catch  Result Value Ref Range Status   Specimen Description   Final    URINE, CLEAN CATCH Performed at [facility], [address] [place], [place] [address]    Special Requests   Final    NONE Performed at [facility], [address], [place], [place] [address]    Culture (A)  Final    <10,000 COLONIES/mL INSIGNIFICANT GROWTH Performed at [facility], [place] [address] [place], [place] [address]    Report Status 04/11/2021 FINAL  Final  Resp Panel by  RT-PCR (Flu A&B, Covid) Nasopharyngeal Swab     Status: None   Collection Time: 04/09/21 12:10 AM   Specimen: Nasopharyngeal Swab; Nasopharyngeal(NP) swabs in vial transport medium  Result Value Ref Range Status   SARS Coronavirus 2 by RT PCR NEGATIVE NEGATIVE Final    Comment: (NOTE) SARS-CoV-2 target nucleic acids are NOT DETECTED.  The SARS-CoV-2 RNA is generally detectable in upper respiratory specimens during the acute phase of infection. The lowest concentration of SARS-CoV-2 viral copies this assay can detect is 138 copies/mL. A negative result does not preclude SARS-Cov-2 infection and should not be used as the sole basis for treatment or other patient management decisions. A negative result may occur with  improper specimen collection/handling, submission of specimen other than nasopharyngeal swab, presence of viral mutation(s) within the areas targeted by this assay, and inadequate number of viral copies(<138 copies/mL). A negative result must be combined with clinical observations, patient history, and epidemiological information. The expected result is  Negative.  Fact Sheet for Patients:  EntrepreneurPulse.com.au  Fact Sheet for Healthcare Providers:  IncredibleEmployment.be  This test is no t yet approved or cleared by the [place] FDA and  has been authorized for detection and/or diagnosis of SARS-CoV-2 by FDA under an Emergency Use Authorization (EUA). This EUA will remain  in effect (meaning this test can be used) for the duration of the COVID-19 declaration under Section 564(b)(1) of the Act, 21 U.S.C.section 360bbb-3(b)(1), unless the authorization is terminated  or revoked sooner.       Influenza A by PCR NEGATIVE NEGATIVE Final   Influenza B by PCR NEGATIVE NEGATIVE Final    Comment: (NOTE) The Xpert Xpress SARS-CoV-2/FLU/RSV plus assay is intended as an aid in the diagnosis of influenza from Nasopharyngeal swab specimens and should not be used as a sole basis for treatment. Nasal washings and aspirates are unacceptable for Xpert Xpress SARS-CoV-2/FLU/RSV testing.  Fact Sheet for Patients: EntrepreneurPulse.com.au  Fact Sheet for Healthcare Providers: IncredibleEmployment.be  This test is not yet approved or cleared by the [place] FDA and has been authorized for detection and/or diagnosis of SARS-CoV-2 by FDA under an Emergency Use Authorization (EUA). This EUA will remain in effect (meaning this test can be used) for the duration of the COVID-19 declaration under Section 564(b)(1) of the Act, 21 U.S.C. section 360bbb-3(b)(1), unless the authorization is terminated or revoked.  Performed at [facility], [address] [place], [place] [address]   Culture, blood (routine x 2)     Status: None (Preliminary result)   Collection Time: 04/10/21 12:09 AM   Specimen: Right Antecubital; Blood  Result Value Ref Range Status   Specimen Description RIGHT ANTECUBITAL  Final   Special Requests   Final    BOTTLES DRAWN AEROBIC AND ANAEROBIC  Blood Culture results may not be optimal due to an excessive volume of blood received in culture bottles   Culture   Final    NO GROWTH 1 DAY Performed at [facility], [address], [place], [place] [address]    Report Status PENDING  Incomplete  Culture, blood (routine x 2)     Status: None (Preliminary result)   Collection Time: 04/10/21 12:09 AM   Specimen: Left Antecubital; Blood  Result Value Ref Range Status   Specimen Description LEFT ANTECUBITAL  Final   Special Requests   Final    BOTTLES DRAWN AEROBIC AND ANAEROBIC Blood Culture results may not be optimal due to an excessive volume of blood received in culture bottles  Culture   Final    NO GROWTH 1 DAY Performed at [facility], [address], [place], [place] [address]    Report Status PENDING  Incomplete  MRSA Next Gen by PCR, Nasal     Status: None   Collection Time: 04/10/21  2:47 PM   Specimen: Nasal Mucosa; Nasal Swab  Result Value Ref Range Status   MRSA by PCR Next Gen NOT DETECTED NOT DETECTED Final    Comment: (NOTE) The GeneXpert MRSA Assay (FDA approved for NASAL specimens only), is one component of a comprehensive MRSA colonization surveillance program. It is not intended to diagnose MRSA infection nor to guide or monitor treatment for MRSA infections. Test performance is not FDA approved in patients less than 30 years old. Performed at [facility], [address] [place],  [address]          Radiology Studies: ECHOCARDIOGRAM COMPLETE  Result Date: 04/10/2021    ECHOCARDIOGRAM REPORT   Patient Name:   AZLAAN Cowger Date of Exam: 04/10/2021 Medical Rec #:  [id number]    Height:       69.0 in Accession #:    [id number]   Weight:       217.0 lb Date of Birth:  1960-08-20    BSA:          2.139 m Patient Age:    9 years     BP:           142/83 mmHg Patient Gender: M            HR:           85 bpm. Exam Location:  [facility] Procedure: 2D Echo, Cardiac Doppler and Color Doppler Indications:    SBE  I33.9  History:        Patient has no prior history of Echocardiogram examinations.                 Previous Myocardial Infarction, Stroke; Risk                 Factors:Hypertension and Diabetes. Hx of cancer. TBI (traumatic                 brain injury) ([place]) (From Hx).  Sonographer:    Alvino Chapel RCS Referring Phys: [place]  1. Left ventricular ejection fraction, by estimation, is 55 to 60%. The left ventricle has normal function. The left ventricle has no regional wall motion abnormalities. There is moderate concentric left ventricular hypertrophy. Left ventricular diastolic parameters are consistent with Grade II diastolic dysfunction (pseudonormalization).  2. Right ventricular systolic function is normal. The right ventricular size is normal. There is normal pulmonary artery systolic pressure. The estimated right ventricular systolic pressure is 123XX123 mmHg.  3. Left atrial size was moderately dilated.  4. Right atrial size was mildly dilated.  5. The mitral valve is grossly normal. No evidence of mitral valve regurgitation. No evidence of mitral stenosis.  6. The aortic valve is tricuspid. Aortic valve regurgitation is not visualized. No aortic stenosis is present.  7. The inferior vena cava is normal in size with greater than 50% respiratory variability, suggesting right atrial pressure of 3 mmHg. FINDINGS  Left Ventricle: Left ventricular ejection fraction, by estimation, is 55 to 60%. The left ventricle has normal function. The left ventricle has no regional wall motion abnormalities. The left ventricular internal cavity size was normal in size. There is  moderate concentric left ventricular hypertrophy. Left ventricular  diastolic parameters are consistent with Grade II diastolic dysfunction (pseudonormalization). Right Ventricle: The right ventricular size is normal. No increase in right ventricular wall thickness. Right ventricular systolic function is normal. There is  normal pulmonary artery systolic pressure. The tricuspid regurgitant velocity is 2.10 m/s, and  with an assumed right atrial pressure of 3 mmHg, the estimated right ventricular systolic pressure is 123XX123 mmHg. Left Atrium: Left atrial size was moderately dilated. Right Atrium: Right atrial size was mildly dilated. Pericardium: There is no evidence of pericardial effusion. Mitral Valve: The mitral valve is grossly normal. No evidence of mitral valve regurgitation. No evidence of mitral valve stenosis. Tricuspid Valve: The tricuspid valve is grossly normal. Tricuspid valve regurgitation is trivial. No evidence of tricuspid stenosis. Aortic Valve: The aortic valve is tricuspid. Aortic valve regurgitation is not visualized. No aortic stenosis is present. Pulmonic Valve: The pulmonic valve was grossly normal. Pulmonic valve regurgitation is not visualized. No evidence of pulmonic stenosis. Aorta: The aortic root is normal in size and structure. Venous: The inferior vena cava is normal in size with greater than 50% respiratory variability, suggesting right atrial pressure of 3 mmHg. IAS/Shunts: The atrial septum is grossly normal.  LEFT VENTRICLE PLAX 2D LVIDd:         4.70 cm  Diastology LVIDs:         3.10 cm  LV e' medial:    6.74 cm/s LV PW:         1.50 cm  LV E/e' medial:  16.5 LV IVS:        1.50 cm  LV e' lateral:   9.14 cm/s LVOT diam:     2.00 cm  LV E/e' lateral: 12.1 LV SV:         65 LV SV Index:   31 LVOT Area:     3.14 cm  RIGHT VENTRICLE RV S prime:     13.80 cm/s TAPSE (M-mode): 2.6 cm LEFT ATRIUM             Index       RIGHT ATRIUM           Index LA diam:        4.20 cm 1.96 cm/m  RA Area:     24.30 cm LA Vol (A2C):   79.4 ml 37.12 ml/m RA Volume:   81.40 ml  38.06 ml/m LA Vol (A4C):   93.6 ml 43.76 ml/m LA Biplane Vol: 86.2 ml 40.30 ml/m  AORTIC VALVE LVOT Vmax:   102.00 cm/s LVOT Vmean:  62.500 cm/s LVOT VTI:    0.208 m  AORTA Ao Root diam: 3.60 cm MITRAL VALVE                TRICUSPID VALVE MV  Area (PHT): 4.15 cm     TR Peak grad:   17.6 mmHg MV Decel Time: 183 msec     TR Vmax:        210.00 cm/s MV E velocity: 111.00 cm/s MV A velocity: 79.70 cm/s   SHUNTS MV E/A ratio:  1.39         Systemic VTI:  0.21 m                             Systemic Diam: 2.00 cm Eleonore Chiquito MD Electronically signed by Eleonore Chiquito MD Signature Date/Time: 04/10/2021/1:25:55 PM    Final         Scheduled Meds:  aspirin EC  81 mg Oral Daily   buPROPion  100 mg Oral BID   carvedilol  25 mg Oral BID WC   Chlorhexidine Gluconate Cloth  6 each Topical Daily   furosemide  40 mg Intravenous Q12H   heparin  5,000 Units Subcutaneous Q8H   hydrALAZINE  100 mg Oral Q8H   insulin aspart  0-15 Units Subcutaneous TID WC   insulin aspart  0-5 Units Subcutaneous QHS   NIFEdipine  90 mg Oral Daily   rosuvastatin  20 mg Oral Daily   sacubitril-valsartan  1 tablet Oral BID   spironolactone  12.5 mg Oral Daily   Continuous Infusions:  azithromycin 500 mg (04/12/21 0253)   cefTRIAXone (ROCEPHIN)  IV 1 g (04/12/21 0057)   dextrose 5 % and 0.45% NaCl Stopped (04/10/21 1437)     LOS: 2 days   Time spent= 35 mins    Andrick Rust Arsenio Loader, MD [facility]  If 7PM-7AM, please contact night-coverage  04/12/2021, 7:57 AM
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
## 2021-04-12 NOTE — Progress Notes (Signed)
**Note De-Identified Ramin Zoll Obfuscation** Patient removed from BIPAP and placed on 10 L HFNC, Patient alert , VS WNL, BBS cl/dim. RRT to continue to monitor.

## 2021-04-12 NOTE — Progress Notes (Signed)
Inpatient Diabetes Program Recommendations  AACE/ADA: New Consensus Statement on Inpatient Glycemic Control (2015)  Target Ranges:  Prepandial:   less than 140 mg/dL      Peak postprandial:   less than 180 mg/dL (1-2 hours)      Critically ill patients:  140 - 180 mg/dL   Lab Results  Component Value Date   GLUCAP 331 (H) 04/12/2021   HGBA1C 10.3 (H) 04/10/2021    Review of Glycemic Control Results for KEANTHONY, RUPP (MRN [id number]) as of 04/12/2021 09:07  Ref. Range 04/11/2021 11:09 04/11/2021 16:30 04/11/2021 21:36 04/12/2021 07:13  Glucose-Capillary Latest Ref Range: 70 - 99 mg/dL 277 (H) 207 (H) 209 (H) 331 (H)   Diabetes history: DM 2 Outpatient Diabetes medications:  Jardiance 10 mg daily Novolog tid Basaglar 30 units daily Current orders for Inpatient glycemic control:  Novolog moderate tid with meals and HS Semglee 20 units daily (starts today)  Inpatient Diabetes Program Recommendations:    Agree with addition of basal insulin.  Will follow.   Thanks,  Adah Perl, RN, BC-ADM Inpatient Diabetes Coordinator Pager [phone]  (8a-5p)

## 2021-04-13 DIAGNOSIS — J9601 Acute respiratory failure with hypoxia: Secondary | ICD-10-CM | POA: Diagnosis not present

## 2021-04-13 DIAGNOSIS — E11649 Type 2 diabetes mellitus with hypoglycemia without coma: Secondary | ICD-10-CM | POA: Diagnosis not present

## 2021-04-13 DIAGNOSIS — J189 Pneumonia, unspecified organism: Secondary | ICD-10-CM | POA: Diagnosis not present

## 2021-04-13 DIAGNOSIS — R739 Hyperglycemia, unspecified: Secondary | ICD-10-CM | POA: Diagnosis not present

## 2021-04-13 DIAGNOSIS — I10 Essential (primary) hypertension: Secondary | ICD-10-CM | POA: Diagnosis not present

## 2021-04-13 DIAGNOSIS — I5033 Acute on chronic diastolic (congestive) heart failure: Secondary | ICD-10-CM | POA: Diagnosis not present

## 2021-04-13 LAB — BASIC METABOLIC PANEL
Anion gap: 6 (ref 5–15)
BUN: 58 mg/dL — ABNORMAL HIGH (ref 6–20)
CO2: 23 mmol/L (ref 22–32)
Calcium: 7.2 mg/dL — ABNORMAL LOW (ref 8.9–10.3)
Chloride: 110 mmol/L (ref 98–111)
Creatinine, Ser: 2.85 mg/dL — ABNORMAL HIGH (ref 0.61–1.24)
GFR, Estimated: 25 mL/min — ABNORMAL LOW (ref >60)
Glucose, Bld: 170 mg/dL — ABNORMAL HIGH (ref 70–99)
Potassium: 3.5 mmol/L (ref 3.5–5.1)
Sodium: 139 mmol/L (ref 135–145)

## 2021-04-13 LAB — CBC
HCT: 29.5 % — ABNORMAL LOW (ref 39.0–52.0)
Hemoglobin: 9.3 g/dL — ABNORMAL LOW (ref 13.0–17.0)
MCH: 30.6 pg (ref 26.0–34.0)
MCHC: 31.5 g/dL (ref 30.0–36.0)
MCV: 97 fL (ref 80.0–100.0)
Platelets: 189 10*3/uL (ref 150–400)
RBC: 3.04 MIL/uL — ABNORMAL LOW (ref 4.22–5.81)
RDW: 14 % (ref 11.5–15.5)
WBC: 7.5 10*3/uL (ref 4.0–10.5)
nRBC: 0 % (ref 0.0–0.2)

## 2021-04-13 LAB — COMPREHENSIVE METABOLIC PANEL
ALT: 13 U/L (ref 0–44)
AST: 13 U/L — ABNORMAL LOW (ref 15–41)
Albumin: 2.6 g/dL — ABNORMAL LOW (ref 3.5–5.0)
Alkaline Phosphatase: 59 U/L (ref 38–126)
Anion gap: 11 (ref 5–15)
BUN: 60 mg/dL — ABNORMAL HIGH (ref 6–20)
CO2: 19 mmol/L — ABNORMAL LOW (ref 22–32)
Calcium: 7.3 mg/dL — ABNORMAL LOW (ref 8.9–10.3)
Chloride: 108 mmol/L (ref 98–111)
Creatinine, Ser: 2.71 mg/dL — ABNORMAL HIGH (ref 0.61–1.24)
GFR, Estimated: 26 mL/min — ABNORMAL LOW (ref >60)
Glucose, Bld: 191 mg/dL — ABNORMAL HIGH (ref 70–99)
Potassium: 3.6 mmol/L (ref 3.5–5.1)
Sodium: 138 mmol/L (ref 135–145)
Total Bilirubin: 1 mg/dL (ref 0.3–1.2)
Total Protein: 6.4 g/dL — ABNORMAL LOW (ref 6.5–8.1)

## 2021-04-13 LAB — PROCALCITONIN: Procalcitonin: 0.54 ng/mL

## 2021-04-13 LAB — MAGNESIUM: Magnesium: 2.3 mg/dL (ref 1.7–2.4)

## 2021-04-13 LAB — GLUCOSE, CAPILLARY
Glucose-Capillary: 154 mg/dL — ABNORMAL HIGH (ref 70–99)
Glucose-Capillary: 208 mg/dL — ABNORMAL HIGH (ref 70–99)
Glucose-Capillary: 199 mg/dL — ABNORMAL HIGH (ref 70–99)
Glucose-Capillary: 216 mg/dL — ABNORMAL HIGH (ref 70–99)
Glucose-Capillary: 281 mg/dL — ABNORMAL HIGH (ref 70–99)

## 2021-04-13 LAB — BRAIN NATRIURETIC PEPTIDE: B Natriuretic Peptide: 930 pg/mL — ABNORMAL HIGH (ref 0.0–100.0)

## 2021-04-13 MED ORDER — PIPERACILLIN-TAZOBACTAM 3.375 G IVPB
3.3750 g | Freq: Three times a day (TID) | INTRAVENOUS | Status: DC
  Administered 2021-04-13 – 2021-04-16 (×9): 3.375 g via INTRAVENOUS
  Filled 2021-04-13 (×9): qty 50

## 2021-04-13 MED ORDER — ORAL CARE MOUTH RINSE
15.0000 mL | Freq: Two times a day (BID) | OROMUCOSAL | Status: DC
  Administered 2021-04-13 – 2021-04-27 (×25): 15 mL via OROMUCOSAL

## 2021-04-13 MED ORDER — INSULIN GLARGINE-YFGN 100 UNIT/ML ~~LOC~~ SOLN
30.0000 [IU] | Freq: Every day | SUBCUTANEOUS | Status: DC
  Administered 2021-04-14: 30 [IU] via SUBCUTANEOUS
  Filled 2021-04-13 (×2): qty 0.3

## 2021-04-13 MED ORDER — PIPERACILLIN-TAZOBACTAM 3.375 G IVPB
3.3750 g | Freq: Once | INTRAVENOUS | Status: AC
  Administered 2021-04-13: 3.375 g via INTRAVENOUS
  Filled 2021-04-13: qty 50

## 2021-04-13 MED ORDER — INSULIN GLARGINE-YFGN 100 UNIT/ML ~~LOC~~ SOLN
10.0000 [IU] | Freq: Once | SUBCUTANEOUS | Status: AC
  Administered 2021-04-13: 10 [IU] via SUBCUTANEOUS
  Filled 2021-04-13: qty 0.1

## 2021-04-13 MED ORDER — PREDNISONE 20 MG PO TABS
40.0000 mg | ORAL_TABLET | Freq: Every day | ORAL | Status: DC
  Administered 2021-04-13 – 2021-04-14 (×2): 40 mg via ORAL
  Filled 2021-04-13 (×2): qty 2

## 2021-04-13 MED ORDER — POTASSIUM CHLORIDE CRYS ER 20 MEQ PO TBCR
40.0000 meq | EXTENDED_RELEASE_TABLET | Freq: Once | ORAL | Status: AC
  Administered 2021-04-13: 40 meq via ORAL
  Filled 2021-04-13: qty 2

## 2021-04-13 NOTE — Progress Notes (Signed)
Pharmacy Antibiotic Note  Keith Snow is a 60 y.o. male admitted on 04/09/2021 with  aspiration pneumonia .  Pharmacy has been consulted for zosyn dosing.  Plan: Zosyn 3.375g IV q8h (4 hour infusion).  Height: 5\' 9"  (175.3 cm) Weight: 101.4 kg (223 lb 8.7 oz) IBW/kg (Calculated) : 70.7  Temp (24hrs), Avg:99 F (37.2 C), Min:98.2 F (36.8 C), Max:100 F (37.8 C)  Recent Labs  Lab 04/10/21 0009 04/10/21 0215 04/10/21 0611 04/10/21 1010 04/11/21 0932 04/11/21 1132 04/11/21 1411 04/12/21 1420 04/12/21 1830 04/12/21 2209 04/13/21 0240 04/13/21 0647  WBC 10.0  --  9.7  --   --   --   --   --   --   --   --  7.5  CREATININE 2.21* 2.04*  --    < > 1.91*  --    < > 2.86* 2.92* 2.85* 2.85* 2.71*  LATICACIDVEN 1.6 1.6  --   --  1.9 1.5  --   --   --   --   --   --    < > = values in this interval not displayed.    Estimated Creatinine Clearance: 34.9 mL/min (A) (by C-G formula based on SCr of 2.71 mg/dL (H)).    No Known Allergies  Antimicrobials this admission: Zosyn 9 /20 >> Azith/CTX 9/18 >> 9/20  Microbiology results: 9/17 BCx: ngtd 9/17 UCx: insignificant growth    Thank you for allowing pharmacy to be a part of this patient's care.  Ramond Craver 04/13/2021 10:24 AM
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
## 2021-04-13 NOTE — Progress Notes (Signed)
Progress Note  Patient Name: Keith Snow Date of Encounter: 04/13/2021  [facility] Cardiologist: [place] Dr Margreta Journey  Subjective   Ongoing SOB  Inpatient Medications    Scheduled Meds:  aspirin EC  81 mg Oral Daily   buPROPion  100 mg Oral BID   carvedilol  25 mg Oral BID WC   Chlorhexidine Gluconate Cloth  6 each Topical Daily   heparin  5,000 Units Subcutaneous Q8H   hydrALAZINE  100 mg Oral Q8H   insulin aspart  0-15 Units Subcutaneous TID WC   insulin aspart  0-5 Units Subcutaneous QHS   insulin glargine-yfgn  20 Units Subcutaneous Daily   ipratropium-albuterol  3 mL Nebulization Q6H   NIFEdipine  90 mg Oral Daily   pantoprazole  40 mg Oral Daily   potassium chloride  40 mEq Oral Once   predniSONE  40 mg Oral Q breakfast   rosuvastatin  20 mg Oral Daily   Continuous Infusions:  dextrose 5 % and 0.45% NaCl Stopped (04/10/21 1437)   piperacillin-tazobactam     PRN Meds: acetaminophen **OR** acetaminophen, hydrALAZINE, ipratropium-albuterol, labetalol, ondansetron **OR** ondansetron (ZOFRAN) IV, oxyCODONE, senna-docusate, traZODone   Vital Signs    Vitals:   04/13/21 0450 04/13/21 0500 04/13/21 0718 04/13/21 0821  BP:  (!) 152/75    Pulse:  82 82   Resp:  16 14   Temp:   99.3 F (37.4 C)   TempSrc:   Oral   SpO2:  92% 94% 96%  Weight: 101.4 kg     Height:        Intake/Output Summary (Last 24 hours) at 04/13/2021 0846 Last data filed at 04/13/2021 0450 Gross per 24 hour  Intake --  Output 1000 ml  Net -1000 ml   Last 3 Weights 04/13/2021 04/12/2021 04/11/2021  Weight (lbs) 223 lb 8.7 oz 217 lb 6 oz 218 lb 0.6 oz  Weight (kg) 101.4 kg 98.6 kg 98.9 kg      Telemetry    SR - Personally Reviewed  ECG    N/a - Personally Reviewed  Physical Exam   GEN: No acute distress.   Neck: No JVD Cardiac: RRR, no murmurs, rubs, or gallops.  Respiratory: coarse bilaterally GI: Soft, nontender, non-distended  MS: No edema; No deformity. Neuro:   Nonfocal  Psych: Normal affect   Labs    High Sensitivity Troponin:   Recent Labs  Lab 04/10/21 0009 04/10/21 0215  TROPONINIHS 91* 87*     Chemistry Recent Labs  Lab 04/10/21 0009 04/10/21 0215 04/12/21 2209 04/13/21 0240 04/13/21 0647  NA 137   < > 140 139 138  K 3.6   < > 3.7 3.5 3.6  CL 104   < > 109 110 108  CO2 25   < > 27 23 19*  GLUCOSE 534*   < > 176* 170* 191*  BUN 54*   < > 58* 58* 60*  CREATININE 2.21*   < > 2.85* 2.85* 2.71*  CALCIUM 8.4*   < > 7.3* 7.2* 7.3*  MG  --   --   --   --  2.3  PROT 7.2  --   --   --  6.4*  ALBUMIN 3.0*  --   --   --  2.6*  AST 21  --   --   --  13*  ALT 22  --   --   --  13  ALKPHOS 61  --   --   --  59  BILITOT 0.9  --   --   --  1.0  GFRNONAA 34*   < > 25* 25* 26*  ANIONGAP 8   < > 4* 6 11   < > = values in this interval not displayed.    Lipids No results for input(s): CHOL, TRIG, HDL, LABVLDL, LDLCALC, CHOLHDL in the last 168 hours.  Hematology Recent Labs  Lab 04/10/21 0009 04/10/21 0611 04/13/21 0647  WBC 10.0 9.7 7.5  RBC 3.97* 3.70* 3.04*  HGB 12.2* 11.5* 9.3*  HCT 36.7* 34.8* 29.5*  MCV 92.4 94.1 97.0  MCH 30.7 31.1 30.6  MCHC 33.2 33.0 31.5  RDW 13.6 13.7 14.0  PLT 271 232 189   Thyroid No results for input(s): TSH, FREET4 in the last 168 hours.  BNP Recent Labs  Lab 04/10/21 0009 04/11/21 0932 04/12/21 0300  BNP 1,441.0* 1,112.0* 1,110.0*    DDimer No results for input(s): DDIMER in the last 168 hours.   Radiology    CT CHEST WO CONTRAST  Result Date: 04/12/2021 CLINICAL DATA:  60 year old male with history of acute hypoxic respiratory failure requiring BiPAP. EXAM: CT CHEST WITHOUT CONTRAST TECHNIQUE: Multidetector CT imaging of the chest was performed following the standard protocol without IV contrast. COMPARISON:  No priors. FINDINGS: Cardiovascular: Heart size is normal. There is no significant pericardial fluid, thickening or pericardial calcification. There is aortic atherosclerosis, as  well as atherosclerosis of the great vessels of the mediastinum and the coronary arteries, including calcified atherosclerotic plaque in the left anterior descending and left circumflex coronary arteries. Mediastinum/Nodes: Multiple prominent borderline enlarged mediastinal and bilateral hilar lymph nodes are noted, nonspecific and likely reactive. Esophagus is unremarkable in appearance. No axillary lymphadenopathy. Lungs/Pleura: Small bilateral pleural effusions (right greater than left) lying dependently. Patchy multifocal airspace consolidation most evident in a peribronchovascular distribution with surrounding ground-glass attenuation and septal thickening. Upper Abdomen: Unremarkable. Musculoskeletal: There are no aggressive appearing lytic or blastic lesions noted in the visualized portions of the skeleton. Multiple old healed rib fractures. IMPRESSION: 1. Severe multilobar bilateral bronchopneumonia with small bilateral parapneumonic pleural effusions lying dependently, as above. 2. Aortic atherosclerosis, in addition to 2 vessel coronary artery disease. Please note that although the presence of coronary artery calcium documents the presence of coronary artery disease, the severity of this disease and any potential stenosis cannot be assessed on this non-gated CT examination. Assessment for potential risk factor modification, dietary therapy or pharmacologic therapy may be warranted, if clinically indicated. Aortic Atherosclerosis (ICD10-I70.0). Electronically Signed   By: Vinnie Langton M.D.   On: 04/12/2021 18:58    Cardiac Studies    Patient Profile     Keith Snow is a 60 y.o. male with a hx of HTN and chronic diastolic HF who is being seen 04/12/2021 for the evaluation of SOB at the request of Dr Reesa Chew.  Assessment & Plan    1.Pneumonia -per primary team - CXR fairly mild findings - due to ongoing hypoxia and increased work of breathing had noncontrast CT yesterday that showed severe  bilateral bronchopneumonia - procalcitonin 0.54, uptrend - on bipap and intermittent HFNC  2.Acute on chronic diastolic HF - - long history, followed in [place] - echo LVEF 55-60%, grade II dd, normal RV -from care everywhere labs chronically elevated proBNP - 03/22/21 clinic visit 204 lbs however he reports he goes to cardiac rehab every Tues and Thurs and weights have consistently been 215 lbs.  - admit weight 216 lbs. Later weights 220-->217, todays  weight appears inaccurate at 223  - had been on IV lasix 40mg  bid. Cr 2.21 on admission up to 2.85. Baseline 1.7 to 2.2 - exam not overally consistent with volume overload,   - difficult assessment of true volume status based on data above. Chronically elevated BNP and unclear baseline weight, exam not overally impressive. CT most suggestive with severe bronchopneumonia as primary issue with his ongoing hypoxia. Lab trends would suggest he is drying out.  - would continue to hold diuretic today given ongoing AKI - hold entersto, jardiance, aldactone with AKI    For questions or updates, please contact [place] Please consult [email or web address] for contact info under        Signed, Carlyle Dolly, MD  04/13/2021, 8:46 AM

## 2021-04-13 NOTE — Progress Notes (Signed)
**Note De-Identified Keith Snow Obfuscation** Patient removed from BIPAP and placed on 30L 100% HHFNC; tolerating well. VS WNL, BBS cl/dim, SAT 97%. RRT to titrate O2 as tolerated.

## 2021-04-13 NOTE — Progress Notes (Addendum)
Was told in report that the patient had to be put back on Bipap due to increased WOB.  Dayshift RT explained that she had to bump patient's FIO2 up to 60% when put back on.  Came in to give treatment and assess Bipap and FIO2 was on 80%, patient sat was at 98%, so this RT weaned patient back down to 60% FIO2, patient sat is currently at 95%, will continue to monitor.

## 2021-04-13 NOTE — Progress Notes (Addendum)
PROGRESS NOTE    Keith Snow  ZOX:[id number] DOB: 01/20/1961 DOA: 04/09/2021 PCP: System, Provider Not In   Brief Narrative:  60 year old with history of DM2, HTN, MI, CHF comes to the ED with change in mental status.  Upon admission he was noted to have focal consolidation in the right lower lung, elevated BNP.  He was also hyperglycemic.   Assessment & Plan:   Active Problems:   Acute respiratory failure with hypoxia (HCC)   Hyperglycemia   Type 2 diabetes mellitus with hypoglycemia without coma (HCC)   HTN (hypertension)   AKI (acute kidney injury) ([place])  Acute hypoxic respiratory failure requiring BiPAP - Multifactorial.  Combination of COPD and CHF exacerbation. -CT without contrast- Severe b/l BronchoPNA with small b/l parapneumonic effusions.   Community-acquired pneumonia. Aspiration PNA?  - Procalcitonin 0.33 > 54.  Follow-up culture data. Speech and swallow Evaluation.  - Change Abx to Zosyn. Prednisone 40mg  daily x 5 days.  - Bronchodilators scheduled and as needed.  I-S/flutter - Out of bed to chair He has had multiple intubations in the past, will get S&S eval.   Acute metabolic encephalopathy - Suspect secondary to underlying infection.  No focal neurodeficits.  CT head-  Diabetes mellitus type 2, uncontrolled with hyperglycemia - A1c-10.3 - Increase Long-acting insulin 30 units daily - Sliding scale and Accu-Chek  Acute congestive heart failure with preserved EF, grade 2 DD, class III -Echo 60%, grade 2 DD, moderate LVH - Due to rising renal function, hold Lasix, Aldactone and Entresto today Cardiology following.   Acute kidney injury on CKD stage IIIa - Admission creatinine 1.8, 2.63 > 2.71 - Bladder scan, straight cath if greater than 300  Elevated troponin - Likely demand ischemia.  No acute changes on EKG.  Essential hypertension -Coreg, nifedipine.  IV hydralazine  Hyperlipidemia - Crestor    DVT prophylaxis: Subcu heparin Code Status:  Full code Family Communication: Spoke with his wife.   Status is: Inpatient  Remains inpatient appropriate because:Inpatient level of care appropriate due to severity of illness  Dispo: The patient is from: Home              Anticipated d/c is to: Home              Patient currently is not medically stable to d/c.   Difficult to place patient No       Subjective: Patient gets very tachypenic off BiPAP but for now seems to be tolerating high flow.   Review of Systems Otherwise negative except as per HPI, including: General = no fevers, chills, dizziness,  fatigue HEENT/EYES = negative for loss of vision, double vision, blurred vision,  sore throa Cardiovascular= negative for chest pain, palpitation Respiratory/lungs= negative for hemoptysis,  Gastrointestinal= negative for nausea, vomiting, abdominal pain Genitourinary= negative for Dysuria MSK = Negative for arthralgia, myalgias Neurology= Negative for headache, numbness, tingling  Psychiatry= Negative for suicidal and homocidal ideation Skin= Negative for Rash   Examination:  Constitutional: Not in acute distress; high flow. Overall ill appearing.  Respiratory: b/l rhonchi, mild  Cardiovascular: Normal sinus rhythm, no rubs Abdomen: Nontender nondistended good bowel sounds Musculoskeletal: No edema noted Skin: No rashes seen Neurologic: CN 2-12 grossly intact.  And nonfocal Psychiatric: Normal judgment and insight. Alert and oriented x 3. Normal mood.     Objective: Vitals:   04/13/21 0821 04/13/21 0900 04/13/21 1000 04/13/21 1100  BP:  (!) 160/67  (!) 145/75  Pulse:  87 78 76  Resp:  Marland Kitchen)  7 18 (!) 8  Temp:      TempSrc:      SpO2: 96% 91% 90% 91%  Weight:      Height:        Intake/Output Summary (Last 24 hours) at 04/13/2021 1138 Last data filed at 04/13/2021 1134 Gross per 24 hour  Intake 750.42 ml  Output 500 ml  Net 250.42 ml   Filed Weights   04/11/21 0440 04/12/21 0438 04/13/21 0450  Weight:  98.9 kg 98.6 kg 101.4 kg     Data Reviewed:   CBC: Recent Labs  Lab 04/10/21 0009 04/10/21 0611 04/13/21 0647  WBC 10.0 9.7 7.5  NEUTROABS 8.2*  --   --   HGB 12.2* 11.5* 9.3*  HCT 36.7* 34.8* 29.5*  MCV 92.4 94.1 97.0  PLT 271 232 382   Basic Metabolic Panel: Recent Labs  Lab 04/12/21 1420 04/12/21 1830 04/12/21 2209 04/13/21 0240 04/13/21 0647  NA 140 138 140 139 138  K 3.8 3.7 3.7 3.5 3.6  CL 107 108 109 110 108  CO2 27 23 27 23  19*  GLUCOSE 312* 252* 176* 170* 191*  BUN 53* 54* 58* 58* 60*  CREATININE 2.86* 2.92* 2.85* 2.85* 2.71*  CALCIUM 7.7* 7.3* 7.3* 7.2* 7.3*  MG  --   --   --   --  2.3   GFR: Estimated Creatinine Clearance: 34.9 mL/min (A) (by C-G formula based on SCr of 2.71 mg/dL (H)). Liver Function Tests: Recent Labs  Lab 04/10/21 0009 04/13/21 0647  AST 21 13*  ALT 22 13  ALKPHOS 61 59  BILITOT 0.9 1.0  PROT 7.2 6.4*  ALBUMIN 3.0* 2.6*   No results for input(s): LIPASE, AMYLASE in the last 168 hours. No results for input(s): AMMONIA in the last 168 hours. Coagulation Profile: No results for input(s): INR, PROTIME in the last 168 hours. Cardiac Enzymes: No results for input(s): CKTOTAL, CKMB, CKMBINDEX, TROPONINI in the last 168 hours. BNP (last 3 results) No results for input(s): PROBNP in the last 8760 hours. HbA1C: No results for input(s): HGBA1C in the last 72 hours.  CBG: Recent Labs  Lab 04/12/21 1109 04/12/21 1628 04/12/21 2113 04/13/21 0305 04/13/21 0717  GLUCAP 276* 304* 180* 154* 208*   Lipid Profile: No results for input(s): CHOL, HDL, LDLCALC, TRIG, CHOLHDL, LDLDIRECT in the last 72 hours. Thyroid Function Tests: No results for input(s): TSH, T4TOTAL, FREET4, T3FREE, THYROIDAB in the last 72 hours. Anemia Panel: No results for input(s): VITAMINB12, FOLATE, FERRITIN, TIBC, IRON, RETICCTPCT in the last 72 hours. Sepsis Labs: Recent Labs  Lab 04/10/21 0009 04/10/21 0215 04/11/21 0608 04/11/21 0932  04/11/21 1132 04/13/21 0647  PROCALCITON  --   --  0.33  --   --  0.54  LATICACIDVEN 1.6 1.6  --  1.9 1.5  --     Recent Results (from the past 240 hour(s))  Urine Culture     Status: Abnormal   Collection Time: 04/09/21 12:07 AM   Specimen: Urine, Clean Catch  Result Value Ref Range Status   Specimen Description   Final    URINE, CLEAN CATCH Performed at [facility], [address] [place], [place] [address]    Special Requests   Final    NONE Performed at [facility], [address] [place], [place] [address]    Culture (A)  Final    <10,000 COLONIES/mL INSIGNIFICANT GROWTH Performed at [facility], [place] [address] [place], [place] [address]    Report  Status 04/11/2021 FINAL  Final  Resp Panel by RT-PCR (Flu A&B, Covid) Nasopharyngeal Swab     Status: None   Collection Time: 04/09/21 12:10 AM   Specimen: Nasopharyngeal Swab; Nasopharyngeal(NP) swabs in vial transport medium  Result Value Ref Range Status   SARS Coronavirus 2 by RT PCR NEGATIVE NEGATIVE Final    Comment: (NOTE) SARS-CoV-2 target nucleic acids are NOT DETECTED.  The SARS-CoV-2 RNA is generally detectable in upper respiratory specimens during the acute phase of infection. The lowest concentration of SARS-CoV-2 viral copies this assay can detect is 138 copies/mL. A negative result does not preclude SARS-Cov-2 infection and should not be used as the sole basis for treatment or other patient management decisions. A negative result may occur with  improper specimen collection/handling, submission of specimen other than nasopharyngeal swab, presence of viral mutation(s) within the areas targeted by this assay, and inadequate number of viral copies(<138 copies/mL). A negative result must be combined with clinical observations, patient history, and epidemiological information. The expected result is Negative.  Fact Sheet for Patients:  EntrepreneurPulse.com.au  Fact Sheet for  Healthcare Providers:  IncredibleEmployment.be  This test is no t yet approved or cleared by the [place] FDA and  has been authorized for detection and/or diagnosis of SARS-CoV-2 by FDA under an Emergency Use Authorization (EUA). This EUA will remain  in effect (meaning this test can be used) for the duration of the COVID-19 declaration under Section 564(b)(1) of the Act, 21 U.S.C.section 360bbb-3(b)(1), unless the authorization is terminated  or revoked sooner.       Influenza A by PCR NEGATIVE NEGATIVE Final   Influenza B by PCR NEGATIVE NEGATIVE Final    Comment: (NOTE) The Xpert Xpress SARS-CoV-2/FLU/RSV plus assay is intended as an aid in the diagnosis of influenza from Nasopharyngeal swab specimens and should not be used as a sole basis for treatment. Nasal washings and aspirates are unacceptable for Xpert Xpress SARS-CoV-2/FLU/RSV testing.  Fact Sheet for Patients: EntrepreneurPulse.com.au  Fact Sheet for Healthcare Providers: IncredibleEmployment.be  This test is not yet approved or cleared by the [place] FDA and has been authorized for detection and/or diagnosis of SARS-CoV-2 by FDA under an Emergency Use Authorization (EUA). This EUA will remain in effect (meaning this test can be used) for the duration of the COVID-19 declaration under Section 564(b)(1) of the Act, 21 U.S.C. section 360bbb-3(b)(1), unless the authorization is terminated or revoked.  Performed at [facility], [address] [place], [place] [address]   Culture, blood (routine x 2)     Status: None (Preliminary result)   Collection Time: 04/10/21 12:09 AM   Specimen: Right Antecubital; Blood  Result Value Ref Range Status   Specimen Description RIGHT ANTECUBITAL  Final   Special Requests   Final    BOTTLES DRAWN AEROBIC AND ANAEROBIC Blood Culture results may not be optimal due to an excessive volume of blood received in culture  bottles   Culture   Final    NO GROWTH 3 DAYS Performed at [facility], [address] [place], [place] [address]    Report Status PENDING  Incomplete  Culture, blood (routine x 2)     Status: None (Preliminary result)   Collection Time: 04/10/21 12:09 AM   Specimen: Left Antecubital; Blood  Result Value Ref Range Status   Specimen Description LEFT ANTECUBITAL  Final   Special Requests   Final    BOTTLES DRAWN AEROBIC AND ANAEROBIC Blood Culture results may not be optimal due to  an excessive volume of blood received in culture bottles   Culture   Final    NO GROWTH 3 DAYS Performed at [facility], [address], [place], [place] [address]    Report Status PENDING  Incomplete  MRSA Next Gen by PCR, Nasal     Status: None   Collection Time: 04/10/21  2:47 PM   Specimen: Nasal Mucosa; Nasal Swab  Result Value Ref Range Status   MRSA by PCR Next Gen NOT DETECTED NOT DETECTED Final    Comment: (NOTE) The GeneXpert MRSA Assay (FDA approved for NASAL specimens only), is one component of a comprehensive MRSA colonization surveillance program. It is not intended to diagnose MRSA infection nor to guide or monitor treatment for MRSA infections. Test performance is not FDA approved in patients less than 36 years old. Performed at [facility], [address], [place], [place] [address]          Radiology Studies: CT CHEST WO CONTRAST  Result Date: 04/12/2021 CLINICAL DATA:  60 year old male with history of acute hypoxic respiratory failure requiring BiPAP. EXAM: CT CHEST WITHOUT CONTRAST TECHNIQUE: Multidetector CT imaging of the chest was performed following the standard protocol without IV contrast. COMPARISON:  No priors. FINDINGS: Cardiovascular: Heart size is normal. There is no significant pericardial fluid, thickening or pericardial calcification. There is aortic atherosclerosis, as well as atherosclerosis of the great vessels of the mediastinum and the coronary arteries,  including calcified atherosclerotic plaque in the left anterior descending and left circumflex coronary arteries. Mediastinum/Nodes: Multiple prominent borderline enlarged mediastinal and bilateral hilar lymph nodes are noted, nonspecific and likely reactive. Esophagus is unremarkable in appearance. No axillary lymphadenopathy. Lungs/Pleura: Small bilateral pleural effusions (right greater than left) lying dependently. Patchy multifocal airspace consolidation most evident in a peribronchovascular distribution with surrounding ground-glass attenuation and septal thickening. Upper Abdomen: Unremarkable. Musculoskeletal: There are no aggressive appearing lytic or blastic lesions noted in the visualized portions of the skeleton. Multiple old healed rib fractures. IMPRESSION: 1. Severe multilobar bilateral bronchopneumonia with small bilateral parapneumonic pleural effusions lying dependently, as above. 2. Aortic atherosclerosis, in addition to 2 vessel coronary artery disease. Please note that although the presence of coronary artery calcium documents the presence of coronary artery disease, the severity of this disease and any potential stenosis cannot be assessed on this non-gated CT examination. Assessment for potential risk factor modification, dietary therapy or pharmacologic therapy may be warranted, if clinically indicated. Aortic Atherosclerosis (ICD10-I70.0). Electronically Signed   By: Vinnie Langton M.D.   On: 04/12/2021 18:58        Scheduled Meds:  aspirin EC  81 mg Oral Daily   buPROPion  100 mg Oral BID   carvedilol  25 mg Oral BID WC   Chlorhexidine Gluconate Cloth  6 each Topical Daily   heparin  5,000 Units Subcutaneous Q8H   hydrALAZINE  100 mg Oral Q8H   insulin aspart  0-15 Units Subcutaneous TID WC   insulin aspart  0-5 Units Subcutaneous QHS   insulin glargine-yfgn  10 Units Subcutaneous Once   [START ON 04/14/2021] insulin glargine-yfgn  30 Units Subcutaneous Daily    ipratropium-albuterol  3 mL Nebulization Q6H   NIFEdipine  90 mg Oral Daily   pantoprazole  40 mg Oral Daily   predniSONE  40 mg Oral Q breakfast   rosuvastatin  20 mg Oral Daily   Continuous Infusions:  dextrose 5 % and 0.45% NaCl Stopped (04/10/21 1437)   piperacillin-tazobactam (ZOSYN)  IV  LOS: 3 days   Time spent= 35 mins    Kaysin Brock Arsenio Loader, MD [facility]  If 7PM-7AM, please contact night-coverage  04/13/2021, 11:38 AM

## 2021-04-13 NOTE — Progress Notes (Signed)
Inpatient Diabetes Program Recommendations  AACE/ADA: New Consensus Statement on Inpatient Glycemic Control (2015)  Target Ranges:  Prepandial:   less than 140 mg/dL      Peak postprandial:   less than 180 mg/dL (1-2 hours)      Critically ill patients:  140 - 180 mg/dL   Lab Results  Component Value Date   GLUCAP 208 (H) 04/13/2021   HGBA1C 10.3 (H) 04/10/2021    Review of Glycemic Control Results for SHAQUILE, LUTZE (MRN [id number]) as of 04/13/2021 09:15  Ref. Range 04/12/2021 11:09 04/12/2021 16:28 04/12/2021 21:13 04/13/2021 03:05 04/13/2021 07:17  Glucose-Capillary Latest Ref Range: 70 - 99 mg/dL 276 (H) 304 (H) 180 (H) 154 (H) 208 (H)   Diabetes history: DM 2 Outpatient Diabetes medications:  Jardiance 10 mg daily Novolog tid Basaglar 30 units daily Current orders for Inpatient glycemic control:  Novolog moderate tid with meals and HS Semglee 20 units daily  Inpatient Diabetes Program Recommendations:    Consider increasing Semglee to 30 units daily.  Patient remains on high flow .  Will talk to him when appropriate.   Thanks,  Adah Perl, RN, BC-ADM Inpatient Diabetes Coordinator Pager [phone]  (8a-5p)

## 2021-04-14 ENCOUNTER — Inpatient Hospital Stay (HOSPITAL_COMMUNITY): Payer: Medicare Other

## 2021-04-14 DIAGNOSIS — J9601 Acute respiratory failure with hypoxia: Secondary | ICD-10-CM | POA: Diagnosis not present

## 2021-04-14 DIAGNOSIS — I5033 Acute on chronic diastolic (congestive) heart failure: Secondary | ICD-10-CM | POA: Diagnosis not present

## 2021-04-14 DIAGNOSIS — N179 Acute kidney failure, unspecified: Secondary | ICD-10-CM

## 2021-04-14 DIAGNOSIS — I10 Essential (primary) hypertension: Secondary | ICD-10-CM | POA: Diagnosis not present

## 2021-04-14 DIAGNOSIS — R739 Hyperglycemia, unspecified: Secondary | ICD-10-CM | POA: Diagnosis not present

## 2021-04-14 LAB — URINALYSIS, COMPLETE (UACMP) WITH MICROSCOPIC
Bilirubin Urine: NEGATIVE
Glucose, UA: NEGATIVE mg/dL
Hgb urine dipstick: NEGATIVE
Ketones, ur: NEGATIVE mg/dL
Leukocytes,Ua: NEGATIVE
Nitrite: NEGATIVE
Specific Gravity, Urine: >1.03 — ABNORMAL HIGH (ref 1.005–1.030)
pH: 5 (ref 5.0–8.0)

## 2021-04-14 LAB — CBC
HCT: 28.2 % — ABNORMAL LOW (ref 39.0–52.0)
Hemoglobin: 9.1 g/dL — ABNORMAL LOW (ref 13.0–17.0)
MCH: 31.1 pg (ref 26.0–34.0)
MCHC: 32.3 g/dL (ref 30.0–36.0)
MCV: 96.2 fL (ref 80.0–100.0)
Platelets: 182 10*3/uL (ref 150–400)
RBC: 2.93 MIL/uL — ABNORMAL LOW (ref 4.22–5.81)
RDW: 13.4 % (ref 11.5–15.5)
WBC: 6.2 10*3/uL (ref 4.0–10.5)
nRBC: 0 % (ref 0.0–0.2)

## 2021-04-14 LAB — BLOOD GAS, ARTERIAL
Acid-base deficit: 4 mmol/L — ABNORMAL HIGH (ref 0.0–2.0)
Bicarbonate: 20.9 mmol/L (ref 20.0–28.0)
FIO2: 100
O2 Saturation: 89.9 %
Patient temperature: 36.1
pCO2 arterial: 38.2 mmHg (ref 32.0–48.0)
pH, Arterial: 7.35 (ref 7.350–7.450)
pO2, Arterial: 62.7 mmHg — ABNORMAL LOW (ref 83.0–108.0)

## 2021-04-14 LAB — GLUCOSE, CAPILLARY
Glucose-Capillary: 361 mg/dL — ABNORMAL HIGH (ref 70–99)
Glucose-Capillary: 351 mg/dL — ABNORMAL HIGH (ref 70–99)
Glucose-Capillary: 368 mg/dL — ABNORMAL HIGH (ref 70–99)
Glucose-Capillary: 140 mg/dL — ABNORMAL HIGH (ref 70–99)
Glucose-Capillary: 156 mg/dL — ABNORMAL HIGH (ref 70–99)

## 2021-04-14 LAB — BRAIN NATRIURETIC PEPTIDE: B Natriuretic Peptide: 1360 pg/mL — ABNORMAL HIGH (ref 0.0–100.0)

## 2021-04-14 LAB — COMPREHENSIVE METABOLIC PANEL WITH GFR
ALT: 17 U/L (ref 0–44)
AST: 23 U/L (ref 15–41)
Albumin: 2.5 g/dL — ABNORMAL LOW (ref 3.5–5.0)
Alkaline Phosphatase: 100 U/L (ref 38–126)
Anion gap: 11 (ref 5–15)
BUN: 78 mg/dL — ABNORMAL HIGH (ref 6–20)
CO2: 21 mmol/L — ABNORMAL LOW (ref 22–32)
Calcium: 7.7 mg/dL — ABNORMAL LOW (ref 8.9–10.3)
Chloride: 102 mmol/L (ref 98–111)
Creatinine, Ser: 3.17 mg/dL — ABNORMAL HIGH (ref 0.61–1.24)
GFR, Estimated: 22 mL/min — ABNORMAL LOW
Glucose, Bld: 350 mg/dL — ABNORMAL HIGH (ref 70–99)
Potassium: 4.3 mmol/L (ref 3.5–5.1)
Sodium: 134 mmol/L — ABNORMAL LOW (ref 135–145)
Total Bilirubin: 0.7 mg/dL (ref 0.3–1.2)
Total Protein: 6.7 g/dL (ref 6.5–8.1)

## 2021-04-14 LAB — SEDIMENTATION RATE: Sed Rate: 113 mm/hr — ABNORMAL HIGH (ref 0–16)

## 2021-04-14 LAB — CREATININE, URINE, RANDOM: Creatinine, Urine: 245.68 mg/dL

## 2021-04-14 LAB — MAGNESIUM: Magnesium: 2.6 mg/dL — ABNORMAL HIGH (ref 1.7–2.4)

## 2021-04-14 LAB — SODIUM, URINE, RANDOM: Sodium, Ur: 10 mmol/L

## 2021-04-14 MED ORDER — CLONIDINE HCL 0.1 MG PO TABS
0.1000 mg | ORAL_TABLET | ORAL | Status: DC | PRN
  Administered 2021-04-15 – 2021-04-16 (×3): 0.1 mg via ORAL
  Filled 2021-04-14 (×3): qty 1

## 2021-04-14 MED ORDER — INSULIN ASPART 100 UNIT/ML IJ SOLN
3.0000 [IU] | Freq: Three times a day (TID) | INTRAMUSCULAR | Status: DC
  Administered 2021-04-14 – 2021-04-15 (×3): 3 [IU] via SUBCUTANEOUS

## 2021-04-14 MED ORDER — METHYLPREDNISOLONE SODIUM SUCC 125 MG IJ SOLR
80.0000 mg | Freq: Two times a day (BID) | INTRAMUSCULAR | Status: DC
  Administered 2021-04-14 – 2021-04-16 (×4): 80 mg via INTRAVENOUS
  Filled 2021-04-14 (×4): qty 2

## 2021-04-14 MED ORDER — INSULIN GLARGINE-YFGN 100 UNIT/ML ~~LOC~~ SOLN
36.0000 [IU] | Freq: Every day | SUBCUTANEOUS | Status: DC
  Administered 2021-04-15: 36 [IU] via SUBCUTANEOUS
  Filled 2021-04-14 (×2): qty 0.36

## 2021-04-14 NOTE — Progress Notes (Signed)
PROGRESS NOTE    Keith Snow  JWJ:[id number] DOB: 05/23/1961 DOA: 04/09/2021 PCP: System, Provider Not In   Brief Narrative:  60 year old with history of DM2, HTN, MI, CHF comes to the ED with change in mental status.  Upon admission he was noted to have focal consolidation in the right lower lung, elevated BNP.  He was also hyperglycemic.  CT of the chest showed combination of severe bilateral bronchopneumonia and possible effusion.  It has been difficult to diurese him due to worsening renal function.  Nephrology consulted for their input.   Assessment & Plan:   Active Problems:   Acute respiratory failure with hypoxia (HCC)   Hyperglycemia   Type 2 diabetes mellitus with hypoglycemia without coma (HCC)   HTN (hypertension)   AKI (acute kidney injury) ([place])  Acute hypoxic respiratory failure requiring BiPAP - Multifactorial.  Combination of COPD and CHF exacerbation. -CT without contrast- Severe b/l BronchoPNA with small b/l parapneumonic effusions.   Community-acquired pneumonia. Aspiration PNA?  - Procalcitonin 0.33 > 54.  Follow-up culture data. Speech and swallow Evaluation.  -Continue IV Zosyn.  Prednisone 40 mg day 2/5 - Bronchodilators scheduled and as needed.  I-S/flutter -Speech and swallow eval-regular diet  Diabetes mellitus type 2, uncontrolled with hyperglycemia - A1c-10.3 - Increase Long-acting insulin 36 units daily.  NovoLog 3 units Premeal - Sliding scale and Accu-Chek  Acute congestive heart failure with preserved EF, grade 2 DD, class III -Echo 60%, grade 2 DD, moderate LVH - Due to rising renal function, hold Lasix, Aldactone and Entresto today Cardiology following.   Acute kidney injury on CKD stage IIIa - Admission creatinine 1.8, 2.63 > 2.71 > 3.17 -Wonder if this is from ongoing sepsis/pneumonia versus any other etiology - Off-and-on he has some urinary retention therefore Foley catheter to be placed for accurate input and output - Nephrology  consulted for their input - Renal ultrasound  Acute metabolic encephalopathy -Resolved.   Essential hypertension -Coreg, nifedipine.  IV hydralazine  Hyperlipidemia - Crestor    DVT prophylaxis: Subcu heparin Code Status: Full code Family Communication: Wife updated periodically.   Status is: Inpatient  Remains inpatient appropriate because:Inpatient level of care appropriate due to severity of illness  Dispo: The patient is from: Home              Anticipated d/c is to: Home              Patient currently is not medically stable to d/c.   Difficult to place patient No       Subjective: Patient did well on high flow during the day yesterday but overnight he required BiPAP.  He still has some respiratory dyspnea with minimal movement of the bed.  Review of Systems Otherwise negative except as per HPI, including: General: Denies fever, chills, night sweats or unintended weight loss. Resp: Denies hemoptysis Cardiac: Denies chest pain, palpitations, orthopnea, paroxysmal nocturnal dyspnea. GI: Denies abdominal pain, nausea, vomiting, diarrhea or constipation GU: Denies dysuria, frequency, hesitancy or incontinence MS: Denies muscle aches, joint pain or swelling Neuro: Denies headache, neurologic deficits (focal weakness, numbness, tingling), abnormal gait Psych: Denies anxiety, depression, SI/HI/AVH Skin: Denies new rashes or lesions ID: Denies sick contacts, exotic exposures, travel  Examination: Constitutional: Not in acute distress, high flow nasal cannula Respiratory: Mild bilateral rhonchi Cardiovascular: Normal sinus rhythm, no rubs Abdomen: Nontender nondistended good bowel sounds Musculoskeletal: No edema noted Skin: No rashes seen Neurologic: CN 2-12 grossly intact.  And nonfocal Psychiatric: Normal judgment and insight.  Alert and oriented x 3. Normal mood.      Objective: Vitals:   04/14/21 0540 04/14/21 0600 04/14/21 0807 04/14/21 0815  BP: (!)  161/75 (!) 152/78 124/81   Pulse:  75 74   Resp:  13 18   Temp:    (!) 97 F (36.1 C)  TempSrc:    Oral  SpO2:  96% 96%   Weight:      Height:        Intake/Output Summary (Last 24 hours) at 04/14/2021 1144 Last data filed at 04/14/2021 1020 Gross per 24 hour  Intake 99.95 ml  Output 300 ml  Net -200.05 ml   Filed Weights   04/12/21 0438 04/13/21 0450 04/14/21 0419  Weight: 98.6 kg 101.4 kg 102.1 kg     Data Reviewed:   CBC: Recent Labs  Lab 04/10/21 0009 04/10/21 0611 04/13/21 0647 04/14/21 0435  WBC 10.0 9.7 7.5 6.2  NEUTROABS 8.2*  --   --   --   HGB 12.2* 11.5* 9.3* 9.1*  HCT 36.7* 34.8* 29.5* 28.2*  MCV 92.4 94.1 97.0 96.2  PLT 271 232 189 557   Basic Metabolic Panel: Recent Labs  Lab 04/12/21 1830 04/12/21 2209 04/13/21 0240 04/13/21 0647 04/14/21 0435  NA 138 140 139 138 134*  K 3.7 3.7 3.5 3.6 4.3  CL 108 109 110 108 102  CO2 23 27 23  19* 21*  GLUCOSE 252* 176* 170* 191* 350*  BUN 54* 58* 58* 60* 78*  CREATININE 2.92* 2.85* 2.85* 2.71* 3.17*  CALCIUM 7.3* 7.3* 7.2* 7.3* 7.7*  MG  --   --   --  2.3 2.6*   GFR: Estimated Creatinine Clearance: 29.9 mL/min (A) (by C-G formula based on SCr of 3.17 mg/dL (H)). Liver Function Tests: Recent Labs  Lab 04/10/21 0009 04/13/21 0647 04/14/21 0435  AST 21 13* 23  ALT 22 13 17   ALKPHOS 61 59 100  BILITOT 0.9 1.0 0.7  PROT 7.2 6.4* 6.7  ALBUMIN 3.0* 2.6* 2.5*   No results for input(s): LIPASE, AMYLASE in the last 168 hours. No results for input(s): AMMONIA in the last 168 hours. Coagulation Profile: No results for input(s): INR, PROTIME in the last 168 hours. Cardiac Enzymes: No results for input(s): CKTOTAL, CKMB, CKMBINDEX, TROPONINI in the last 168 hours. BNP (last 3 results) No results for input(s): PROBNP in the last 8760 hours. HbA1C: No results for input(s): HGBA1C in the last 72 hours.  CBG: Recent Labs  Lab 04/13/21 1152 04/13/21 1644 04/13/21 2051 04/14/21 0305 04/14/21 0802   GLUCAP 199* 216* 281* 361* 351*   Lipid Profile: No results for input(s): CHOL, HDL, LDLCALC, TRIG, CHOLHDL, LDLDIRECT in the last 72 hours. Thyroid Function Tests: No results for input(s): TSH, T4TOTAL, FREET4, T3FREE, THYROIDAB in the last 72 hours. Anemia Panel: No results for input(s): VITAMINB12, FOLATE, FERRITIN, TIBC, IRON, RETICCTPCT in the last 72 hours. Sepsis Labs: Recent Labs  Lab 04/10/21 0009 04/10/21 0215 04/11/21 0608 04/11/21 0932 04/11/21 1132 04/13/21 0647  PROCALCITON  --   --  0.33  --   --  0.54  LATICACIDVEN 1.6 1.6  --  1.9 1.5  --     Recent Results (from the past 240 hour(s))  Urine Culture     Status: Abnormal   Collection Time: 04/09/21 12:07 AM   Specimen: Urine, Clean Catch  Result Value Ref Range Status   Specimen Description   Final    URINE, CLEAN CATCH Performed at [facility],  [address], [place], [place] [address]    Special Requests   Final    NONE Performed at [facility], [address], [place], [place] [address]    Culture (A)  Final    <10,000 COLONIES/mL INSIGNIFICANT GROWTH Performed at [facility] [address], [place], [place] [address]    Report Status 04/11/2021 FINAL  Final  Resp Panel by RT-PCR (Flu A&B, Covid) Nasopharyngeal Swab     Status: None   Collection Time: 04/09/21 12:10 AM   Specimen: Nasopharyngeal Swab; Nasopharyngeal(NP) swabs in vial transport medium  Result Value Ref Range Status   SARS Coronavirus 2 by RT PCR NEGATIVE NEGATIVE Final    Comment: (NOTE) SARS-CoV-2 target nucleic acids are NOT DETECTED.  The SARS-CoV-2 RNA is generally detectable in upper respiratory specimens during the acute phase of infection. The lowest concentration of SARS-CoV-2 viral copies this assay can detect is 138 copies/mL. A negative result does not preclude SARS-Cov-2 infection and should not be used as the sole basis for treatment or other patient management decisions. A negative result may occur with   improper specimen collection/handling, submission of specimen other than nasopharyngeal swab, presence of viral mutation(s) within the areas targeted by this assay, and inadequate number of viral copies(<138 copies/mL). A negative result must be combined with clinical observations, patient history, and epidemiological information. The expected result is Negative.  Fact Sheet for Patients:  EntrepreneurPulse.com.au  Fact Sheet for Healthcare Providers:  IncredibleEmployment.be  This test is no t yet approved or cleared by the [place] FDA and  has been authorized for detection and/or diagnosis of SARS-CoV-2 by FDA under an Emergency Use Authorization (EUA). This EUA will remain  in effect (meaning this test can be used) for the duration of the COVID-19 declaration under Section 564(b)(1) of the Act, 21 U.S.C.section 360bbb-3(b)(1), unless the authorization is terminated  or revoked sooner.       Influenza A by PCR NEGATIVE NEGATIVE Final   Influenza B by PCR NEGATIVE NEGATIVE Final    Comment: (NOTE) The Xpert Xpress SARS-CoV-2/FLU/RSV plus assay is intended as an aid in the diagnosis of influenza from Nasopharyngeal swab specimens and should not be used as a sole basis for treatment. Nasal washings and aspirates are unacceptable for Xpert Xpress SARS-CoV-2/FLU/RSV testing.  Fact Sheet for Patients: EntrepreneurPulse.com.au  Fact Sheet for Healthcare Providers: IncredibleEmployment.be  This test is not yet approved or cleared by the [place] FDA and has been authorized for detection and/or diagnosis of SARS-CoV-2 by FDA under an Emergency Use Authorization (EUA). This EUA will remain in effect (meaning this test can be used) for the duration of the COVID-19 declaration under Section 564(b)(1) of the Act, 21 U.S.C. section 360bbb-3(b)(1), unless the authorization is terminated  or revoked.  Performed at [facility], [address], [place], [place] [address]   Culture, blood (routine x 2)     Status: None (Preliminary result)   Collection Time: 04/10/21 12:09 AM   Specimen: Right Antecubital; Blood  Result Value Ref Range Status   Specimen Description RIGHT ANTECUBITAL  Final   Special Requests   Final    BOTTLES DRAWN AEROBIC AND ANAEROBIC Blood Culture results may not be optimal due to an excessive volume of blood received in culture bottles   Culture   Final    NO GROWTH 4 DAYS Performed at [facility], [address] [place], [place] [address]    Report Status PENDING  Incomplete  Culture, blood (routine x  2)     Status: None (Preliminary result)   Collection Time: 04/10/21 12:09 AM   Specimen: Left Antecubital; Blood  Result Value Ref Range Status   Specimen Description LEFT ANTECUBITAL  Final   Special Requests   Final    BOTTLES DRAWN AEROBIC AND ANAEROBIC Blood Culture results may not be optimal due to an excessive volume of blood received in culture bottles   Culture   Final    NO GROWTH 4 DAYS Performed at [facility], [address] [place], [place] [address]    Report Status PENDING  Incomplete  MRSA Next Gen by PCR, Nasal     Status: None   Collection Time: 04/10/21  2:47 PM   Specimen: Nasal Mucosa; Nasal Swab  Result Value Ref Range Status   MRSA by PCR Next Gen NOT DETECTED NOT DETECTED Final    Comment: (NOTE) The GeneXpert MRSA Assay (FDA approved for NASAL specimens only), is one component of a comprehensive MRSA colonization surveillance program. It is not intended to diagnose MRSA infection nor to guide or monitor treatment for MRSA infections. Test performance is not FDA approved in patients less than 13 years old. Performed at [facility], [address], [place], [place] [address]          Radiology Studies: CT CHEST WO CONTRAST  Result Date: 04/12/2021 CLINICAL DATA:  60 year old male with history of acute  hypoxic respiratory failure requiring BiPAP. EXAM: CT CHEST WITHOUT CONTRAST TECHNIQUE: Multidetector CT imaging of the chest was performed following the standard protocol without IV contrast. COMPARISON:  No priors. FINDINGS: Cardiovascular: Heart size is normal. There is no significant pericardial fluid, thickening or pericardial calcification. There is aortic atherosclerosis, as well as atherosclerosis of the great vessels of the mediastinum and the coronary arteries, including calcified atherosclerotic plaque in the left anterior descending and left circumflex coronary arteries. Mediastinum/Nodes: Multiple prominent borderline enlarged mediastinal and bilateral hilar lymph nodes are noted, nonspecific and likely reactive. Esophagus is unremarkable in appearance. No axillary lymphadenopathy. Lungs/Pleura: Small bilateral pleural effusions (right greater than left) lying dependently. Patchy multifocal airspace consolidation most evident in a peribronchovascular distribution with surrounding ground-glass attenuation and septal thickening. Upper Abdomen: Unremarkable. Musculoskeletal: There are no aggressive appearing lytic or blastic lesions noted in the visualized portions of the skeleton. Multiple old healed rib fractures. IMPRESSION: 1. Severe multilobar bilateral bronchopneumonia with small bilateral parapneumonic pleural effusions lying dependently, as above. 2. Aortic atherosclerosis, in addition to 2 vessel coronary artery disease. Please note that although the presence of coronary artery calcium documents the presence of coronary artery disease, the severity of this disease and any potential stenosis cannot be assessed on this non-gated CT examination. Assessment for potential risk factor modification, dietary therapy or pharmacologic therapy may be warranted, if clinically indicated. Aortic Atherosclerosis (ICD10-I70.0). Electronically Signed   By: Vinnie Langton M.D.   On: 04/12/2021 18:58         Scheduled Meds:  aspirin EC  81 mg Oral Daily   buPROPion  100 mg Oral BID   carvedilol  25 mg Oral BID WC   Chlorhexidine Gluconate Cloth  6 each Topical Daily   heparin  5,000 Units Subcutaneous Q8H   hydrALAZINE  100 mg Oral Q8H   insulin aspart  0-15 Units Subcutaneous TID WC   insulin aspart  0-5 Units Subcutaneous QHS   insulin aspart  3 Units Subcutaneous TID WC   [START ON 04/15/2021] insulin glargine-yfgn  36 Units Subcutaneous Daily   ipratropium-albuterol  3 mL Nebulization Q6H   mouth rinse  15 mL Mouth Rinse BID   NIFEdipine  90 mg Oral Daily   pantoprazole  40 mg Oral Daily   predniSONE  40 mg Oral Q breakfast   rosuvastatin  20 mg Oral Daily   Continuous Infusions:  dextrose 5 % and 0.45% NaCl Stopped (04/10/21 1437)   piperacillin-tazobactam (ZOSYN)  IV 3.375 g (04/14/21 1024)     LOS: 4 days   Time spent= 35 mins    Chia Rock Arsenio Loader, MD [facility]  If 7PM-7AM, please contact night-coverage  04/14/2021, 11:44 AM

## 2021-04-14 NOTE — Progress Notes (Signed)
Progress Note  Patient Name: Keith Snow Date of Encounter: 04/14/2021  [facility] Cardiologist: [place], Dr Margreta Journey  Subjective   [place] SOB  Inpatient Medications    Scheduled Meds:  aspirin EC  81 mg Oral Daily   buPROPion  100 mg Oral BID   carvedilol  25 mg Oral BID WC   Chlorhexidine Gluconate Cloth  6 each Topical Daily   heparin  5,000 Units Subcutaneous Q8H   hydrALAZINE  100 mg Oral Q8H   insulin aspart  0-15 Units Subcutaneous TID WC   insulin aspart  0-5 Units Subcutaneous QHS   insulin glargine-yfgn  30 Units Subcutaneous Daily   ipratropium-albuterol  3 mL Nebulization Q6H   mouth rinse  15 mL Mouth Rinse BID   NIFEdipine  90 mg Oral Daily   pantoprazole  40 mg Oral Daily   predniSONE  40 mg Oral Q breakfast   rosuvastatin  20 mg Oral Daily   Continuous Infusions:  dextrose 5 % and 0.45% NaCl Stopped (04/10/21 1437)   piperacillin-tazobactam (ZOSYN)  IV 3.375 g (04/14/21 0217)   PRN Meds: acetaminophen **OR** acetaminophen, hydrALAZINE, ipratropium-albuterol, labetalol, ondansetron **OR** ondansetron (ZOFRAN) IV, oxyCODONE, senna-docusate, traZODone   Vital Signs    Vitals:   04/14/21 0419 04/14/21 0500 04/14/21 0540 04/14/21 0600  BP:  (!) 161/75 (!) 161/75 (!) 152/78  Pulse:  75  75  Resp:  13  13  Temp:      TempSrc:      SpO2:  93%  96%  Weight: 102.1 kg     Height:        Intake/Output Summary (Last 24 hours) at 04/14/2021 0800 Last data filed at 04/13/2021 2200 Gross per 24 hour  Intake 752.8 ml  Output 300 ml  Net 452.8 ml   Last 3 Weights 04/14/2021 04/13/2021 04/12/2021  Weight (lbs) 225 lb 1.4 oz 223 lb 8.7 oz 217 lb 6 oz  Weight (kg) 102.1 kg 101.4 kg 98.6 kg      Telemetry    SR - Personally Reviewed  ECG    N/a - Personally Reviewed  Physical Exam   GEN: No acute distress.   Neck: No JVD Cardiac: RRR, no murmurs, rubs, or gallops.  Respiratory: coarse bilaterally GI: Soft, nontender, non-distended  MS:  No edema; No deformity. Neuro:  Nonfocal  Psych: Normal affect   Labs    High Sensitivity Troponin:   Recent Labs  Lab 04/10/21 0009 04/10/21 0215  TROPONINIHS 91* 87*     Chemistry Recent Labs  Lab 04/10/21 0009 04/10/21 0215 04/13/21 0240 04/13/21 0647 04/14/21 0435  NA 137   < > 139 138 134*  K 3.6   < > 3.5 3.6 4.3  CL 104   < > 110 108 102  CO2 25   < > 23 19* 21*  GLUCOSE 534*   < > 170* 191* 350*  BUN 54*   < > 58* 60* 78*  CREATININE 2.21*   < > 2.85* 2.71* 3.17*  CALCIUM 8.4*   < > 7.2* 7.3* 7.7*  MG  --   --   --  2.3 2.6*  PROT 7.2  --   --  6.4* 6.7  ALBUMIN 3.0*  --   --  2.6* 2.5*  AST 21  --   --  13* 23  ALT 22  --   --  13 17  ALKPHOS 61  --   --  59 100  BILITOT 0.9  --   --  1.0 0.7  GFRNONAA 34*   < > 25* 26* 22*  ANIONGAP 8   < > 6 11 11    < > = values in this interval not displayed.    Lipids No results for input(s): CHOL, TRIG, HDL, LABVLDL, LDLCALC, CHOLHDL in the last 168 hours.  Hematology Recent Labs  Lab 04/10/21 0611 04/13/21 0647 04/14/21 0435  WBC 9.7 7.5 6.2  RBC 3.70* 3.04* 2.93*  HGB 11.5* 9.3* 9.1*  HCT 34.8* 29.5* 28.2*  MCV 94.1 97.0 96.2  MCH 31.1 30.6 31.1  MCHC 33.0 31.5 32.3  RDW 13.7 14.0 13.4  PLT 232 189 182   Thyroid No results for input(s): TSH, FREET4 in the last 168 hours.  BNP Recent Labs  Lab 04/12/21 0300 04/13/21 0647 04/14/21 0435  BNP 1,110.0* 930.0* 1,360.0*    DDimer No results for input(s): DDIMER in the last 168 hours.   Radiology    CT CHEST WO CONTRAST  Result Date: 04/12/2021 CLINICAL DATA:  60 year old male with history of acute hypoxic respiratory failure requiring BiPAP. EXAM: CT CHEST WITHOUT CONTRAST TECHNIQUE: Multidetector CT imaging of the chest was performed following the standard protocol without IV contrast. COMPARISON:  No priors. FINDINGS: Cardiovascular: Heart size is normal. There is no significant pericardial fluid, thickening or pericardial calcification. There is  aortic atherosclerosis, as well as atherosclerosis of the great vessels of the mediastinum and the coronary arteries, including calcified atherosclerotic plaque in the left anterior descending and left circumflex coronary arteries. Mediastinum/Nodes: Multiple prominent borderline enlarged mediastinal and bilateral hilar lymph nodes are noted, nonspecific and likely reactive. Esophagus is unremarkable in appearance. No axillary lymphadenopathy. Lungs/Pleura: Small bilateral pleural effusions (right greater than left) lying dependently. Patchy multifocal airspace consolidation most evident in a peribronchovascular distribution with surrounding ground-glass attenuation and septal thickening. Upper Abdomen: Unremarkable. Musculoskeletal: There are no aggressive appearing lytic or blastic lesions noted in the visualized portions of the skeleton. Multiple old healed rib fractures. IMPRESSION: 1. Severe multilobar bilateral bronchopneumonia with small bilateral parapneumonic pleural effusions lying dependently, as above. 2. Aortic atherosclerosis, in addition to 2 vessel coronary artery disease. Please note that although the presence of coronary artery calcium documents the presence of coronary artery disease, the severity of this disease and any potential stenosis cannot be assessed on this non-gated CT examination. Assessment for potential risk factor modification, dietary therapy or pharmacologic therapy may be warranted, if clinically indicated. Aortic Atherosclerosis (ICD10-I70.0). Electronically Signed   By: Vinnie Langton M.D.   On: 04/12/2021 18:58    Cardiac Studies     Patient Profile     Keith Snow is a 60 y.o. male with a hx of HTN and chronic diastolic HF who is being seen 04/12/2021 for the evaluation of SOB at the request of Dr Reesa Chew.  Assessment & Plan    1.Pneumonia -per primary team - CXR fairly mild findings - due to ongoing hypoxia and increased work of breathing had noncontrast CT that  showed severe bilateral bronchopneumonia - procalcitonin 0.54, uptrend - on bipap and intermittent HFNC - abx escalated yesterday by primary team   2.Acute on chronic diastolic HF - - long history, followed in [place] - echo LVEF 55-60%, grade II dd, normal RV -from care everywhere labs chronically elevated proBNP - 03/22/21 clinic visit 204 lbs however he reports he goes to cardiac rehab every Tues and Thurs and weights have consistently been 215 lbs.  - admit weight 216 lbs. Later weights 220-->217, todays weight appears inaccurate at  223   - had been on IV lasix 40mg  bid. Cr 2.21 on admission up to 2.63 when diuretics held, now up to 3.17. Baseline 1.7 to 2.2 - exam not overally consistent with volume overload,   - difficult assessment of true volume status based on data above. Chronically elevated BNP and unclear baseline weight, exam not overally impressive. CT most suggestive with severe bronchopneumonia as primary issue with his ongoing hypoxia. Lab trends would suggest he is drying out.  - would continue to hold diuretic today given ongoing AKI, consult renal - hold entersto, jardiance, aldactone with AKI   3. AKI - baseline Cr 1.7 to 2.2 - last diuretic dose 04/12/21 in AM, Cr at that time 2.63 - Cr to day up to 3.17, BUN 78 - consider nephrology consultation. Normal LV and RV function on echo, chronic diastolic HF. His HF would not explain worsening renal dyfunction.  - from nursing report limited uop yesterday, agree with foley catheter placement.    4. Anemia - per primary team  For questions or updates, please contact [place] Please consult [email or web address] for contact info under        Signed, Carlyle Dolly, MD  04/14/2021, 8:00 AM

## 2021-04-14 NOTE — Progress Notes (Signed)
Inpatient Diabetes Program Recommendations  AACE/ADA: New Consensus Statement on Inpatient Glycemic Control (2015)  Target Ranges:  Prepandial:   less than 140 mg/dL      Peak postprandial:   less than 180 mg/dL (1-2 hours)      Critically ill patients:  140 - 180 mg/dL   Lab Results  Component Value Date   GLUCAP 351 (H) 04/14/2021   HGBA1C 10.3 (H) 04/10/2021    Review of Glycemic Control Results for Keith Snow, Keith Snow (MRN [id number]) as of 04/14/2021 09:24  Ref. Range 04/13/2021 16:44 04/13/2021 20:51 04/14/2021 03:05 04/14/2021 08:02  Glucose-Capillary Latest Ref Range: 70 - 99 mg/dL 216 (H) 281 (H) 361 (H) 351 (H)   Diabetes history: DM  Outpatient Diabetes medications:  Jardiance 10 mg daily Novolog tid Basaglar 30 units daily Current orders for Inpatient glycemic control:  Novolog moderate tid with meals and HS Semglee 30 units daily  Prednisone 40 mg daily  Inpatient Diabetes Program Recommendations:    Please consider increasing Semglee to 36 units daily.   Also consider adding Novolog 5 units tid with meals (hold if patient eats less than 50% or NPO).   Thanks,  Adah Perl, RN, BC-ADM Inpatient Diabetes Coordinator Pager [phone]  (8a-5p)
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
## 2021-04-15 DIAGNOSIS — J9622 Acute and chronic respiratory failure with hypercapnia: Secondary | ICD-10-CM | POA: Diagnosis not present

## 2021-04-15 DIAGNOSIS — J9601 Acute respiratory failure with hypoxia: Secondary | ICD-10-CM | POA: Diagnosis not present

## 2021-04-15 DIAGNOSIS — I10 Essential (primary) hypertension: Secondary | ICD-10-CM | POA: Diagnosis not present

## 2021-04-15 DIAGNOSIS — J9621 Acute and chronic respiratory failure with hypoxia: Secondary | ICD-10-CM

## 2021-04-15 DIAGNOSIS — N179 Acute kidney failure, unspecified: Secondary | ICD-10-CM | POA: Diagnosis not present

## 2021-04-15 DIAGNOSIS — E11649 Type 2 diabetes mellitus with hypoglycemia without coma: Secondary | ICD-10-CM | POA: Diagnosis not present

## 2021-04-15 DIAGNOSIS — I5033 Acute on chronic diastolic (congestive) heart failure: Secondary | ICD-10-CM | POA: Diagnosis not present

## 2021-04-15 DIAGNOSIS — Z794 Long term (current) use of insulin: Secondary | ICD-10-CM | POA: Diagnosis not present

## 2021-04-15 LAB — IRON AND TIBC
Iron: 29 ug/dL — ABNORMAL LOW (ref 45–182)
Saturation Ratios: 12 % — ABNORMAL LOW (ref 17.9–39.5)
TIBC: 244 ug/dL — ABNORMAL LOW (ref 250–450)
UIBC: 215 ug/dL

## 2021-04-15 LAB — COMPREHENSIVE METABOLIC PANEL
ALT: 24 U/L (ref 0–44)
AST: 29 U/L (ref 15–41)
Albumin: 2.5 g/dL — ABNORMAL LOW (ref 3.5–5.0)
Alkaline Phosphatase: 129 U/L — ABNORMAL HIGH (ref 38–126)
Anion gap: 13 (ref 5–15)
BUN: 97 mg/dL — ABNORMAL HIGH (ref 6–20)
CO2: 19 mmol/L — ABNORMAL LOW (ref 22–32)
Calcium: 7.6 mg/dL — ABNORMAL LOW (ref 8.9–10.3)
Chloride: 102 mmol/L (ref 98–111)
Creatinine, Ser: 3.49 mg/dL — ABNORMAL HIGH (ref 0.61–1.24)
GFR, Estimated: 19 mL/min — ABNORMAL LOW (ref >60)
Glucose, Bld: 245 mg/dL — ABNORMAL HIGH (ref 70–99)
Potassium: 4.2 mmol/L (ref 3.5–5.1)
Sodium: 134 mmol/L — ABNORMAL LOW (ref 135–145)
Total Bilirubin: 0.7 mg/dL (ref 0.3–1.2)
Total Protein: 6.6 g/dL (ref 6.5–8.1)

## 2021-04-15 LAB — GLUCOSE, CAPILLARY
Glucose-Capillary: 221 mg/dL — ABNORMAL HIGH (ref 70–99)
Glucose-Capillary: 252 mg/dL — ABNORMAL HIGH (ref 70–99)
Glucose-Capillary: 328 mg/dL — ABNORMAL HIGH (ref 70–99)
Glucose-Capillary: 269 mg/dL — ABNORMAL HIGH (ref 70–99)
Glucose-Capillary: 161 mg/dL — ABNORMAL HIGH (ref 70–99)

## 2021-04-15 LAB — CULTURE, BLOOD (ROUTINE X 2)
Culture: NO GROWTH
Culture: NO GROWTH

## 2021-04-15 LAB — FERRITIN: Ferritin: 323 ng/mL (ref 24–336)

## 2021-04-15 LAB — CBC
HCT: 27.7 % — ABNORMAL LOW (ref 39.0–52.0)
Hemoglobin: 8.9 g/dL — ABNORMAL LOW (ref 13.0–17.0)
MCH: 30.8 pg (ref 26.0–34.0)
MCHC: 32.1 g/dL (ref 30.0–36.0)
MCV: 95.8 fL (ref 80.0–100.0)
Platelets: 193 10*3/uL (ref 150–400)
RBC: 2.89 MIL/uL — ABNORMAL LOW (ref 4.22–5.81)
RDW: 13.4 % (ref 11.5–15.5)
WBC: 6.2 10*3/uL (ref 4.0–10.5)
nRBC: 0 % (ref 0.0–0.2)

## 2021-04-15 LAB — PROCALCITONIN: Procalcitonin: 0.34 ng/mL

## 2021-04-15 LAB — MAGNESIUM: Magnesium: 2.8 mg/dL — ABNORMAL HIGH (ref 1.7–2.4)

## 2021-04-15 LAB — BRAIN NATRIURETIC PEPTIDE: B Natriuretic Peptide: 1390 pg/mL — ABNORMAL HIGH (ref 0.0–100.0)

## 2021-04-15 LAB — ANA W/REFLEX IF POSITIVE: Anti Nuclear Antibody (ANA): NEGATIVE

## 2021-04-15 MED ORDER — INSULIN GLARGINE-YFGN 100 UNIT/ML ~~LOC~~ SOLN
42.0000 [IU] | Freq: Every day | SUBCUTANEOUS | Status: DC
  Administered 2021-04-16 – 2021-04-19 (×4): 42 [IU] via SUBCUTANEOUS
  Filled 2021-04-15 (×6): qty 0.42

## 2021-04-15 MED ORDER — POLYETHYLENE GLYCOL 3350 17 G PO PACK
17.0000 g | PACK | Freq: Once | ORAL | Status: AC
  Administered 2021-04-15: 17 g via ORAL
  Filled 2021-04-15: qty 1

## 2021-04-15 MED ORDER — INSULIN ASPART 100 UNIT/ML IJ SOLN
5.0000 [IU] | Freq: Three times a day (TID) | INTRAMUSCULAR | Status: DC
  Administered 2021-04-15 – 2021-04-20 (×15): 5 [IU] via SUBCUTANEOUS

## 2021-04-15 MED ORDER — SODIUM CHLORIDE 0.9 % IV SOLN: INTRAVENOUS | Status: AC

## 2021-04-15 MED ORDER — SENNOSIDES-DOCUSATE SODIUM 8.6-50 MG PO TABS
2.0000 | ORAL_TABLET | Freq: Every evening | ORAL | Status: DC | PRN
  Administered 2021-04-15 – 2021-04-16 (×2): 2 via ORAL
  Filled 2021-04-15 (×3): qty 2

## 2021-04-15 MED ORDER — INSULIN GLARGINE-YFGN 100 UNIT/ML ~~LOC~~ SOLN
10.0000 [IU] | Freq: Once | SUBCUTANEOUS | Status: AC
  Administered 2021-04-15: 10 [IU] via SUBCUTANEOUS
  Filled 2021-04-15: qty 0.1

## 2021-04-15 MED ORDER — FERROUS SULFATE 325 (65 FE) MG PO TABS
325.0000 mg | ORAL_TABLET | Freq: Every day | ORAL | Status: DC
  Administered 2021-04-16 – 2021-05-01 (×16): 325 mg via ORAL
  Filled 2021-04-15 (×17): qty 1
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
## 2021-04-15 NOTE — Progress Notes (Signed)
[facility] Progress Note  Name: Keith Snow MRN: [id number] DOB: 1960-09-28  Chief Complaint:  Weakness, high glucose  Subjective:  he has been in ICU.  He had 550 ml UOP over 9/21 charted and 400 mL thus far today. Apparently yesterday's output may have been day shift only and unsure if anything was charted overnight.  Feels ok. Has been on 40% fio2  Review of systems:  Reports shortness of breath but better since arrival  Denies chest pain Denies n/v/d; actually has had constipation   --------- Background on consult:  Keith Snow is an 60 y.o. male with a PMH significant for longstanding, poorly controlled DM type 2, HTN, chronic Diastolic CHF (DD grade II), h/o covid-19 PNA in 8/20, and CKD stage IIIb (baseline Scr 1.68-2.25) who presented to [facility] ED with weakness, fatigue, and elevated glucose readings >500.  His mother told the ED that he has been having increased sleep, polyuria, and polydipsia for the preceding 24 hrs.  In the ED he was febrile at 100.6, BP 165/89, SpO2 73%, and was noted to be somnolent and in some respiratory distress.  He did have lower extremity edema upon initial examination.  Labs were notable for WBC 10, BUN 54, Cr 2.21, gluc 550, BNP 1141, negative covid test, CXR with focal consolidation in RLL.  He was admitted under sepsis protocol and started on broad spectrum antibiotics, currently on zosyn and prednisone.  We were consulted due to the development of worsening kidney function following IV diuresis.  The trend is seen below.  He was continued on spironolactone and entresto but was held on the 3rd day of admission due to rising SCr.  IV lasix was stopped on 04/12/21.      Intake/Output Summary (Last 24 hours) at 04/15/2021 0854 Last data filed at 04/15/2021 0830 Gross per 24 hour  Intake 176.51 ml  Output 950 ml  Net -773.49 ml    Vitals:  Vitals:   04/15/21 0354 04/15/21 0543 04/15/21 0700 04/15/21 0814  BP:  (!) 151/82  (!) 152/72   Pulse:    80  Resp:    18  Temp: 97.8 F (36.6 C)  98.6 F (37 C)   TempSrc: Axillary  Axillary   SpO2:    94%  Weight:      Height:         Physical Exam:  General adult male seated in chair in no acute distress HEENT normocephalic atraumatic extraocular movements intact sclera anicteric; small hemorrhage right eye Neck supple trachea midline Lungs reduced to auscultation bilaterally and basilar crackles greatest left; normal work of breathing at rest on 40% fio2 Heart S1S2 no rub Abdomen soft nontender nondistended Extremities no edema appreciated Psych normal mood and affect Neuro alert and oriented x 3 provides hx and follows commands Gu - no foley   Medications reviewed   Labs:  BMP Latest Ref Rng & Units 04/15/2021 04/14/2021 04/13/2021  Glucose 70 - 99 mg/dL 245(H) 350(H) 191(H)  BUN 6 - 20 mg/dL 97(H) 78(H) 60(H)  Creatinine 0.61 - 1.24 mg/dL 3.49(H) 3.17(H) 2.71(H)  Sodium 135 - 145 mmol/L 134(L) 134(L) 138  Potassium 3.5 - 5.1 mmol/L 4.2 4.3 3.6  Chloride 98 - 111 mmol/L 102 102 108  CO2 22 - 32 mmol/L 19(L) 21(L) 19(L)  Calcium 8.9 - 10.3 mg/dL 7.6(L) 7.7(L) 7.3(L)     Assessment/Plan:   Assessment/Plan:  AKI on CKD - presumably hemodynamically mediated in setting of transient hypotension with ongoing IV diuresis and  concomitant use of ARB and spironolactone.  Diuretics and ARB on hold now.  Exam not consistent with volume overload.  renal US 12.3 and 10.9 cm kidneys no hydro and normal echogenicity. Bladder scan 70 mL.  UA trace protein and 0-5 RBC, high specific gravity NS at 75 ml/hr x 12 hours No indication for dialysis at this time but may be approaching need if no improvement.  His BUN continues to rise but 2/2 steroids as well  Note appears ana sent as well  CKD stage 3 b - note baseline Cr 1.7- 2.1 Acute hypoxic respiratory failure due to bilateral bronchopneumonia -  his SOB is most likely related to his pneumonia since he has preserved EF.  pulm has  seen.  Chronic diastolic CHF - Cardiology following.  Agree difficult to assess given history of chronically elevated BNP and now with bilateral pna. Hold diuretics for now and follow. Poorly controlled DM with Hyperglycemia - due to acute infection and steroids, per primary.  Historically Hgb A1c has been >10% AMS - per primary team is improved HTN - off of entresto, bumex, and spironolactone from his home meds (according to 03/22/21 ov with his cardiology at the [facility] [place]).  acceptable. Avoid hypotension DM - with hyperglycemia - control per primary team  Anemia - normocytic - check iron stores     Claudia Desanctis, MD 04/15/2021 8:54 AM

## 2021-04-15 NOTE — Progress Notes (Signed)
Progress Note  Patient Name: Keith Snow Date of Encounter: 04/15/2021  [facility] Cardiologist: [place], Dr Margreta Journey  Subjective   Ongoing SOB  Inpatient Medications    Scheduled Meds:  aspirin EC  81 mg Oral Daily   buPROPion  100 mg Oral BID   carvedilol  25 mg Oral BID WC   Chlorhexidine Gluconate Cloth  6 each Topical Daily   heparin  5,000 Units Subcutaneous Q8H   hydrALAZINE  100 mg Oral Q8H   insulin aspart  0-15 Units Subcutaneous TID WC   insulin aspart  0-5 Units Subcutaneous QHS   insulin aspart  3 Units Subcutaneous TID WC   insulin glargine-yfgn  36 Units Subcutaneous Daily   ipratropium-albuterol  3 mL Nebulization Q6H   mouth rinse  15 mL Mouth Rinse BID   methylPREDNISolone (SOLU-MEDROL) injection  80 mg Intravenous Q12H   pantoprazole  40 mg Oral Daily   rosuvastatin  20 mg Oral Daily   Continuous Infusions:  piperacillin-tazobactam (ZOSYN)  IV 3.375 g (04/15/21 0328)   PRN Meds: acetaminophen **OR** acetaminophen, cloNIDine, ipratropium-albuterol, labetalol, ondansetron **OR** ondansetron (ZOFRAN) IV, oxyCODONE, senna-docusate, traZODone   Vital Signs    Vitals:   04/15/21 0300 04/15/21 0354 04/15/21 0543 04/15/21 0700  BP: (!) 136/91  (!) 151/82   Pulse: 76     Resp: 15     Temp:  97.8 F (36.6 C)  98.6 F (37 C)  TempSrc:  Axillary  Axillary  SpO2: 100%     Weight:      Height:        Intake/Output Summary (Last 24 hours) at 04/15/2021 0800 Last data filed at 04/14/2021 1957 Gross per 24 hour  Intake 176.51 ml  Output 550 ml  Net -373.49 ml   Last 3 Weights 04/14/2021 04/13/2021 04/12/2021  Weight (lbs) 225 lb 1.4 oz 223 lb 8.7 oz 217 lb 6 oz  Weight (kg) 102.1 kg 101.4 kg 98.6 kg      Telemetry    SR - Personally Reviewed  ECG    N/a - Personally Reviewed  Physical Exam   GEN: No acute distress.   Neck: No JVD Cardiac: RRR, no murmurs, rubs, or gallops.  Respiratory: coarse bilaterally GI: Soft, nontender,  non-distended  MS: No edema; No deformity. Neuro:  Nonfocal  Psych: Normal affect   Labs    High Sensitivity Troponin:   Recent Labs  Lab 04/10/21 0009 04/10/21 0215  TROPONINIHS 91* 87*     Chemistry Recent Labs  Lab 04/13/21 0647 04/14/21 0435 04/15/21 0459  NA 138 134* 134*  K 3.6 4.3 4.2  CL 108 102 102  CO2 19* 21* 19*  GLUCOSE 191* 350* 245*  BUN 60* 78* 97*  CREATININE 2.71* 3.17* 3.49*  CALCIUM 7.3* 7.7* 7.6*  MG 2.3 2.6* 2.8*  PROT 6.4* 6.7 6.6  ALBUMIN 2.6* 2.5* 2.5*  AST 13* 23 29  ALT 13 17 24   ALKPHOS 59 100 129*  BILITOT 1.0 0.7 0.7  GFRNONAA 26* 22* 19*  ANIONGAP 11 11 13     Lipids No results for input(s): CHOL, TRIG, HDL, LABVLDL, LDLCALC, CHOLHDL in the last 168 hours.  Hematology Recent Labs  Lab 04/13/21 0647 04/14/21 0435 04/15/21 0459  WBC 7.5 6.2 6.2  RBC 3.04* 2.93* 2.89*  HGB 9.3* 9.1* 8.9*  HCT 29.5* 28.2* 27.7*  MCV 97.0 96.2 95.8  MCH 30.6 31.1 30.8  MCHC 31.5 32.3 32.1  RDW 14.0 13.4 13.4  PLT 189 182  193   Thyroid No results for input(s): TSH, FREET4 in the last 168 hours.  BNP Recent Labs  Lab 04/13/21 0647 04/14/21 0435 04/15/21 0459  BNP 930.0* 1,360.0* 1,390.0*    DDimer No results for input(s): DDIMER in the last 168 hours.   Radiology    US RENAL  Result Date: 04/14/2021 CLINICAL DATA:  Acute renal injury EXAM: RENAL / URINARY TRACT ULTRASOUND COMPLETE COMPARISON:  None. FINDINGS: Right Kidney: Renal measurements: 12.3 x 4.5 x 5.7 cm. = volume: 165 mL. Echogenicity within normal limits. No mass or hydronephrosis visualized. Left Kidney: Renal measurements: 10.9 x 6.0 x 4.9 cm. = volume: 66 mL. Echogenicity within normal limits. No mass or hydronephrosis visualized. Bladder: Bladder is well distended. Mild wall thickening is noted anteriorly measuring up to 9 mm. This could be related to underlying UTI. Clinical correlation is recommended. Other: None. IMPRESSION: Normal-appearing kidneys bilaterally. Mild bladder  wall thickening which may be inflammatory in nature. Clinical correlation is recommended. Electronically Signed   By: Inez Catalina M.D.   On: 04/14/2021 15:54   DG Chest Port 1 View  Result Date: 04/14/2021 CLINICAL DATA:  Shortness of breath, history of stroke, diabetes mellitus, hypertension, MI, cancer EXAM: PORTABLE CHEST 1 VIEW COMPARISON:  Portable exam 1340 hours compared to 04/10/2021 FINDINGS: Enlargement of cardiac silhouette with pulmonary vascular congestion. Stable mediastinal contours. BILATERAL pulmonary infiltrates question pulmonary edema, slightly greater at RIGHT base. Small BILATERAL pleural effusions. No pneumothorax or acute osseous findings. IMPRESSION: Enlargement of cardiac silhouette with pulmonary vascular congestion and mild pulmonary edema. Small BILATERAL pleural effusions. Electronically Signed   By: Lavonia Dana M.D.   On: 04/14/2021 16:01    Cardiac Studies    Patient Profile     Anas Reister is a 60 y.o. male with a hx of HTN and chronic diastolic HF who is being seen 04/12/2021 for the evaluation of SOB at the request of Dr Reesa Chew.    Assessment & Plan    1.Pneumonia -per primary team - CXR fairly mild findings  - noncontrast CT that showed severe bilateral bronchopneumonia  - procalcitonin 0.33-->0.54 - on bipap and intermittent HFNC - antibiotics per primary team, also on high dose steroida per pulmonary   2.Acute on chronic diastolic HF - - long history, followed in [place] - echo LVEF 55-60%, grade II dd, normal RV -from care everywhere labs chronically elevated proBNP ranging [phone] - 03/22/21 clinic visit 204 lbs however he reports he goes to cardiac rehab every Tues and Thurs and weights have consistently been 215 lbs.  - admit weight 216 lbs. Later weights 220-->217, todays weight appears inaccurate at 225   - had been on IV lasix 40mg  bid. Cr 2.21 on admission up to 2.63 when diuretics held, now up to 3.49 BUN 97. Baseline 1.7 to 2.2 -  exam not overally consistent with volume overload,   - difficult assessment of true volume status based on data above. Chronically elevated BNP and unclear baseline weight, exam not overally impressive. CT most suggestive with severe bronchopneumonia as primary issue with his ongoing hypoxia. Lab trends would suggest he is drying out.  - hold entersto, jardiance, aldactone with AKI - would continue to hold diuretic at this time.    3. AKI - baseline Cr 1.7 to 2.2 - last diuretic dose 04/12/21 in AM, Cr at that time 2.63 - Cr to day up to 3.49, BUN 97 -Normal LV and RV function on echo, chronic diastolic HF. His HF would  not explain worsening renal dyfunction.  - FeNa <1% suggesting potential reprenal component.  - renal is following     4. Anemia - per primary team  For questions or updates, please contact [place] Please consult [email or web address] for contact info under        Signed, Carlyle Dolly, MD  04/15/2021, 8:00 AM

## 2021-04-15 NOTE — Progress Notes (Signed)
PROGRESS NOTE    Keith Snow  TTS:[id number] DOB: 06-24-1961 DOA: 04/09/2021 PCP: System, Provider Not In   Brief Narrative:  60 year old with history of DM2, HTN, MI, CHF comes to the ED with change in mental status.  Upon admission he was noted to have focal consolidation in the right lower lung, elevated BNP.  He was also hyperglycemic.  CT of the chest showed combination of severe bilateral bronchopneumonia and possible effusion.  It has been difficult to diurese him due to worsening renal function.  Nephrology and Pulm consulted for their input.   Assessment & Plan:   Active Problems:   Acute respiratory failure with hypoxia (HCC)   Hyperglycemia   Type 2 diabetes mellitus with hypoglycemia without coma (HCC)   HTN (hypertension)   AKI (acute kidney injury) ([place])  Acute hypoxic respiratory failure requiring BiPAP and HiFlow - Multifactorial.  Combination of COPD and CHF exacerbation. -CT without contrast- Severe b/l BronchoPNA with small b/l parapneumonic effusions.   Community-acquired pneumonia. Aspiration PNA?  Concerns for BOOP - Procalcitonin 0.33 > 54.  Follow-up culture data. Speech and swallow Evaluation - Reg thin liquid -Continue IV Zosyn.  Seen by Pulm- recommend Solumedrol 72m IV q12hrs - Bronchodilators scheduled and as needed.  I-S/flutter -Speech and swallow eval-regular diet  Diabetes mellitus type 2, uncontrolled with hyperglycemia - A1c-10.3 - Increase Long-acting insulin 42 units daily.  NovoLog 5 units Premeal - Sliding scale and Accu-Chek  Acute congestive heart failure with preserved EF, grade 2 DD, class III -Echo 60%, grade 2 DD, moderate LVH - Due to rising renal function, hold Lasix, Aldactone and Entresto today Cardiology following.   Acute kidney injury on CKD stage IIIa - Admission creatinine 1.8, 2.63 > 2.71 > 3.17 >3.49 -Wonder if this is from ongoing sepsis/pneumonia versus any other etiology - Nephro consulted.  - Renal ultrasound -  neg acute pathology.   Acute metabolic encephalopathy -Resolved.   Essential hypertension -Coreg, nifedipine.  IV hydralazine  Hyperlipidemia - Crestor    DVT prophylaxis: Subcu heparin Code Status: Full code Family Communication: Wife updated periodically. Voicemail box full. Met with his mom and aunt at bedside   Status is: Inpatient  Remains inpatient appropriate because:Inpatient level of care appropriate due to severity of illness  Dispo: The patient is from: Home              Anticipated d/c is to: Home              Patient currently is not medically stable to d/c.   Difficult to place patient No       Subjective: Sitting up in the chair, feels slightly better in terms of breathing.  No other acute events overnight.  Review of Systems Otherwise negative except as per HPI, including: General: Denies fever, chills, night sweats or unintended weight loss. Resp: Denies hemoptysis Cardiac: Denies chest pain, palpitations, orthopnea, paroxysmal nocturnal dyspnea. GI: Denies abdominal pain, nausea, vomiting, diarrhea or constipation GU: Denies dysuria, frequency, hesitancy or incontinence MS: Denies muscle aches, joint pain or swelling Neuro: Denies headache, neurologic deficits (focal weakness, numbness, tingling), abnormal gait Psych: Denies anxiety, depression, SI/HI/AVH Skin: Denies new rashes or lesions ID: Denies sick contacts, exotic exposures, travel  Examination: Constitutional: Not in acute distress, on high flow oxygen Respiratory: Mild bilateral rhonchi Cardiovascular: Normal sinus rhythm, no rubs Abdomen: Nontender nondistended good bowel sounds Musculoskeletal: No edema noted Skin: No rashes seen Neurologic: CN 2-12 grossly intact.  And nonfocal Psychiatric: Normal judgment and insight.  Alert and oriented x 3. Normal mood.    Objective: Vitals:   04/15/21 0814 04/15/21 0900 04/15/21 1027 04/15/21 1100  BP: (!) 152/72 (!) 142/70 (!) 146/80    Pulse: 80 74    Resp: 18 20    Temp:    98.4 F (36.9 C)  TempSrc:    Oral  SpO2: 94% 97%    Weight:      Height:        Intake/Output Summary (Last 24 hours) at 04/15/2021 1149 Last data filed at 04/15/2021 0830 Gross per 24 hour  Intake 78.94 ml  Output 600 ml  Net -521.06 ml   Filed Weights   04/12/21 0438 04/13/21 0450 04/14/21 0419  Weight: 98.6 kg 101.4 kg 102.1 kg     Data Reviewed:   CBC: Recent Labs  Lab 04/10/21 0009 04/10/21 0611 04/13/21 0647 04/14/21 0435 04/15/21 0459  WBC 10.0 9.7 7.5 6.2 6.2  NEUTROABS 8.2*  --   --   --   --   HGB 12.2* 11.5* 9.3* 9.1* 8.9*  HCT 36.7* 34.8* 29.5* 28.2* 27.7*  MCV 92.4 94.1 97.0 96.2 95.8  PLT 271 232 189 182 124   Basic Metabolic Panel: Recent Labs  Lab 04/12/21 2209 04/13/21 0240 04/13/21 0647 04/14/21 0435 04/15/21 0459  NA 140 139 138 134* 134*  K 3.7 3.5 3.6 4.3 4.2  CL 109 110 108 102 102  CO2 27 23 19* 21* 19*  GLUCOSE 176* 170* 191* 350* 245*  BUN 58* 58* 60* 78* 97*  CREATININE 2.85* 2.85* 2.71* 3.17* 3.49*  CALCIUM 7.3* 7.2* 7.3* 7.7* 7.6*  MG  --   --  2.3 2.6* 2.8*   GFR: Estimated Creatinine Clearance: 27.2 mL/min (A) (by C-G formula based on SCr of 3.49 mg/dL (H)). Liver Function Tests: Recent Labs  Lab 04/10/21 0009 04/13/21 0647 04/14/21 0435 04/15/21 0459  AST 21 13* 23 29  ALT _0 ALKPHOS 61 59 100 129*  BILITOT 0.9 1.0 0.7 0.7  PROT 7.2 6.4* 6.7 6.6  ALBUMIN 3.0* 2.6* 2.5* 2.5*   No results for input(s): LIPASE, AMYLASE in the last 168 hours. No results for input(s): AMMONIA in the last 168 hours. Coagulation Profile: No results for input(s): INR, PROTIME in the last 168 hours. Cardiac Enzymes: No results for input(s): CKTOTAL, CKMB, CKMBINDEX, TROPONINI in the last 168 hours. BNP (last 3 results) No results for input(s): PROBNP in the last 8760 hours. HbA1C: No results for input(s): HGBA1C in the last 72 hours.  CBG: Recent Labs  Lab 04/14/21 1653  04/14/21 2115 04/15/21 0352 04/15/21 0754 04/15/21 1115  GLUCAP 140* 156* 221* 252* 328*   Lipid Profile: No results for input(s): CHOL, HDL, LDLCALC, TRIG, CHOLHDL, LDLDIRECT in the last 72 hours. Thyroid Function Tests: No results for input(s): TSH, T4TOTAL, FREET4, T3FREE, THYROIDAB in the last 72 hours. Anemia Panel: Recent Labs    04/15/21 0459  FERRITIN 323  TIBC 244*  IRON 29*   Sepsis Labs: Recent Labs  Lab 04/10/21 0009 04/10/21 0215 04/11/21 5809 04/11/21 0932 04/11/21 1132 04/13/21 0647 04/15/21 0459  PROCALCITON  --   --  0.33  --   --  0.54 0.34  LATICACIDVEN 1.6 1.6  --  1.9 1.5  --   --     Recent Results (from the past 240 hour(s))  Urine Culture     Status: Abnormal   Collection Time: 04/09/21 12:07 AM   Specimen: Urine, Clean Catch  Result Value Ref Range Status   Specimen Description   Final    URINE, CLEAN CATCH Performed at [facility], [address], [place], [place] [address]    Special Requests   Final    NONE Performed at [facility], [address] [place], [place] [address]    Culture (A)  Final    <10,000 COLONIES/mL INSIGNIFICANT GROWTH Performed at [facility] [address], [place], [place] [address]    Report Status 04/11/2021 FINAL  Final  Resp Panel by RT-PCR (Flu A&B, Covid) Nasopharyngeal Swab     Status: None   Collection Time: 04/09/21 12:10 AM   Specimen: Nasopharyngeal Swab; Nasopharyngeal(NP) swabs in vial transport medium  Result Value Ref Range Status   SARS Coronavirus 2 by RT PCR NEGATIVE NEGATIVE Final    Comment: (NOTE) SARS-CoV-2 target nucleic acids are NOT DETECTED.  The SARS-CoV-2 RNA is generally detectable in upper respiratory specimens during the acute phase of infection. The lowest concentration of SARS-CoV-2 viral copies this assay can detect is 138 copies/mL. A negative result does not preclude SARS-Cov-2 infection and should not be used as the sole basis for treatment or other patient  management decisions. A negative result may occur with  improper specimen collection/handling, submission of specimen other than nasopharyngeal swab, presence of viral mutation(s) within the areas targeted by this assay, and inadequate number of viral copies(<138 copies/mL). A negative result must be combined with clinical observations, patient history, and epidemiological information. The expected result is Negative.  Fact Sheet for Patients:  EntrepreneurPulse.com.au  Fact Sheet for Healthcare Providers:  IncredibleEmployment.be  This test is no t yet approved or cleared by the [place] FDA and  has been authorized for detection and/or diagnosis of SARS-CoV-2 by FDA under an Emergency Use Authorization (EUA). This EUA will remain  in effect (meaning this test can be used) for the duration of the COVID-19 declaration under Section 564(b)(1) of the Act, 21 U.S.C.section 360bbb-3(b)(1), unless the authorization is terminated  or revoked sooner.       Influenza A by PCR NEGATIVE NEGATIVE Final   Influenza B by PCR NEGATIVE NEGATIVE Final    Comment: (NOTE) The Xpert Xpress SARS-CoV-2/FLU/RSV plus assay is intended as an aid in the diagnosis of influenza from Nasopharyngeal swab specimens and should not be used as a sole basis for treatment. Nasal washings and aspirates are unacceptable for Xpert Xpress SARS-CoV-2/FLU/RSV testing.  Fact Sheet for Patients: EntrepreneurPulse.com.au  Fact Sheet for Healthcare Providers: IncredibleEmployment.be  This test is not yet approved or cleared by the [place] FDA and has been authorized for detection and/or diagnosis of SARS-CoV-2 by FDA under an Emergency Use Authorization (EUA). This EUA will remain in effect (meaning this test can be used) for the duration of the COVID-19 declaration under Section 564(b)(1) of the Act, 21 U.S.C. section 360bbb-3(b)(1),  unless the authorization is terminated or revoked.  Performed at [facility], [address] [place], [place] [address]   Culture, blood (routine x 2)     Status: None   Collection Time: 04/10/21 12:09 AM   Specimen: Right Antecubital; Blood  Result Value Ref Range Status   Specimen Description RIGHT ANTECUBITAL  Final   Special Requests   Final    BOTTLES DRAWN AEROBIC AND ANAEROBIC Blood Culture results may not be optimal due to an excessive volume of blood received in culture bottles   Culture   Final    NO GROWTH 5 DAYS Performed at [facility], [address] [place], [place] [address]    Report Status 04/15/2021 FINAL  Final  Culture, blood (routine x 2)     Status: None   Collection Time: 04/10/21 12:09 AM   Specimen: Left Antecubital; Blood  Result Value Ref Range Status   Specimen Description LEFT ANTECUBITAL  Final   Special Requests   Final    BOTTLES DRAWN AEROBIC AND ANAEROBIC Blood Culture results may not be optimal due to an excessive volume of blood received in culture bottles   Culture   Final    NO GROWTH 5 DAYS Performed at [facility], [address] [place], [place] [address]    Report Status 04/15/2021 FINAL  Final  MRSA Next Gen by PCR, Nasal     Status: None   Collection Time: 04/10/21  2:47 PM   Specimen: Nasal Mucosa; Nasal Swab  Result Value Ref Range Status   MRSA by PCR Next Gen NOT DETECTED NOT DETECTED Final    Comment: (NOTE) The GeneXpert MRSA Assay (FDA approved for NASAL specimens only), is one component of a comprehensive MRSA colonization surveillance program. It is not intended to diagnose MRSA infection nor to guide or monitor treatment for MRSA infections. Test performance is not FDA approved in patients less than 70 years old. Performed at [facility], [address] [place], [place] [address]          Radiology Studies: US RENAL  Result Date: 04/14/2021 CLINICAL DATA:  Acute renal injury EXAM: RENAL / URINARY TRACT  ULTRASOUND COMPLETE COMPARISON:  None. FINDINGS: Right Kidney: Renal measurements: 12.3 x 4.5 x 5.7 cm. = volume: 165 mL. Echogenicity within normal limits. No mass or hydronephrosis visualized. Left Kidney: Renal measurements: 10.9 x 6.0 x 4.9 cm. = volume: 66 mL. Echogenicity within normal limits. No mass or hydronephrosis visualized. Bladder: Bladder is well distended. Mild wall thickening is noted anteriorly measuring up to 9 mm. This could be related to underlying UTI. Clinical correlation is recommended. Other: None. IMPRESSION: Normal-appearing kidneys bilaterally. Mild bladder wall thickening which may be inflammatory in nature. Clinical correlation is recommended. Electronically Signed   By: Inez Catalina M.D.   On: 04/14/2021 15:54   DG Chest Port 1 View  Result Date: 04/14/2021 CLINICAL DATA:  Shortness of breath, history of stroke, diabetes mellitus, hypertension, MI, cancer EXAM: PORTABLE CHEST 1 VIEW COMPARISON:  Portable exam 1340 hours compared to 04/10/2021 FINDINGS: Enlargement of cardiac silhouette with pulmonary vascular congestion. Stable mediastinal contours. BILATERAL pulmonary infiltrates question pulmonary edema, slightly greater at RIGHT base. Small BILATERAL pleural effusions. No pneumothorax or acute osseous findings. IMPRESSION: Enlargement of cardiac silhouette with pulmonary vascular congestion and mild pulmonary edema. Small BILATERAL pleural effusions. Electronically Signed   By: Lavonia Dana M.D.   On: 04/14/2021 16:01        Scheduled Meds:  aspirin EC  81 mg Oral Daily   buPROPion  100 mg Oral BID   carvedilol  25 mg Oral BID WC   Chlorhexidine Gluconate Cloth  6 each Topical Daily   [START ON 04/16/2021] ferrous sulfate  325 mg Oral Q breakfast   heparin  5,000 Units Subcutaneous Q8H   hydrALAZINE  100 mg Oral Q8H   insulin aspart  0-15 Units Subcutaneous TID WC   insulin aspart  0-5 Units Subcutaneous QHS   insulin aspart  3 Units Subcutaneous TID WC   insulin  glargine-yfgn  36 Units Subcutaneous Daily   ipratropium-albuterol  3 mL Nebulization Q6H  mouth rinse  15 mL Mouth Rinse BID   methylPREDNISolone (SOLU-MEDROL) injection  80 mg Intravenous Q12H   pantoprazole  40 mg Oral Daily   rosuvastatin  20 mg Oral Daily   Continuous Infusions:  sodium chloride 75 mL/hr at 04/15/21 1045   piperacillin-tazobactam (ZOSYN)  IV 3.375 g (04/15/21 1030)     LOS: 5 days   Time spent= 35 mins    Sidni Fusco Arsenio Loader, MD [facility]  If 7PM-7AM, please contact night-coverage  04/15/2021, 11:49 AM

## 2021-04-15 NOTE — Progress Notes (Signed)
**Note De-Identified Elle Vezina Obfuscation** Patient removed from BIPAP and placed on HHFNC 40 L 100%; tolerating well.   RRT to continue to monitor.

## 2021-04-15 NOTE — Plan of Care (Signed)

## 2021-04-16 DIAGNOSIS — I10 Essential (primary) hypertension: Secondary | ICD-10-CM | POA: Diagnosis not present

## 2021-04-16 DIAGNOSIS — J9621 Acute and chronic respiratory failure with hypoxia: Secondary | ICD-10-CM | POA: Diagnosis not present

## 2021-04-16 DIAGNOSIS — N179 Acute kidney failure, unspecified: Secondary | ICD-10-CM | POA: Diagnosis not present

## 2021-04-16 DIAGNOSIS — E11649 Type 2 diabetes mellitus with hypoglycemia without coma: Secondary | ICD-10-CM | POA: Diagnosis not present

## 2021-04-16 LAB — RENAL FUNCTION PANEL
Albumin: 2.5 g/dL — ABNORMAL LOW (ref 3.5–5.0)
Anion gap: 14 (ref 5–15)
BUN: 114 mg/dL — ABNORMAL HIGH (ref 6–20)
CO2: 19 mmol/L — ABNORMAL LOW (ref 22–32)
Calcium: 7.3 mg/dL — ABNORMAL LOW (ref 8.9–10.3)
Chloride: 100 mmol/L (ref 98–111)
Creatinine, Ser: 3.84 mg/dL — ABNORMAL HIGH (ref 0.61–1.24)
GFR, Estimated: 17 mL/min — ABNORMAL LOW (ref >60)
Glucose, Bld: 191 mg/dL — ABNORMAL HIGH (ref 70–99)
Phosphorus: 5.6 mg/dL — ABNORMAL HIGH (ref 2.5–4.6)
Potassium: 4 mmol/L (ref 3.5–5.1)
Sodium: 133 mmol/L — ABNORMAL LOW (ref 135–145)

## 2021-04-16 LAB — GLUCOSE, CAPILLARY
Glucose-Capillary: 168 mg/dL — ABNORMAL HIGH (ref 70–99)
Glucose-Capillary: 188 mg/dL — ABNORMAL HIGH (ref 70–99)
Glucose-Capillary: 302 mg/dL — ABNORMAL HIGH (ref 70–99)
Glucose-Capillary: 216 mg/dL — ABNORMAL HIGH (ref 70–99)
Glucose-Capillary: 173 mg/dL — ABNORMAL HIGH (ref 70–99)

## 2021-04-16 LAB — CBC
HCT: 27 % — ABNORMAL LOW (ref 39.0–52.0)
Hemoglobin: 8.6 g/dL — ABNORMAL LOW (ref 13.0–17.0)
MCH: 30.2 pg (ref 26.0–34.0)
MCHC: 31.9 g/dL (ref 30.0–36.0)
MCV: 94.7 fL (ref 80.0–100.0)
Platelets: 211 10*3/uL (ref 150–400)
RBC: 2.85 MIL/uL — ABNORMAL LOW (ref 4.22–5.81)
RDW: 13.2 % (ref 11.5–15.5)
WBC: 8.6 10*3/uL (ref 4.0–10.5)
nRBC: 0 % (ref 0.0–0.2)

## 2021-04-16 LAB — EXPECTORATED SPUTUM ASSESSMENT W GRAM STAIN, RFLX TO RESP C

## 2021-04-16 LAB — MAGNESIUM: Magnesium: 2.8 mg/dL — ABNORMAL HIGH (ref 1.7–2.4)

## 2021-04-16 LAB — BRAIN NATRIURETIC PEPTIDE: B Natriuretic Peptide: 1341 pg/mL — ABNORMAL HIGH (ref 0.0–100.0)

## 2021-04-16 MED ORDER — POLYETHYLENE GLYCOL 3350 17 G PO PACK
17.0000 g | PACK | Freq: Two times a day (BID) | ORAL | Status: AC
  Administered 2021-04-16: 17 g via ORAL
  Filled 2021-04-16: qty 1

## 2021-04-16 MED ORDER — PIPERACILLIN-TAZOBACTAM IN DEX 2-0.25 GM/50ML IV SOLN: 2.2500 g | Freq: Three times a day (TID) | INTRAVENOUS | Status: DC

## 2021-04-16 MED ORDER — METHYLPREDNISOLONE SODIUM SUCC 40 MG IJ SOLR
40.0000 mg | Freq: Two times a day (BID) | INTRAMUSCULAR | Status: DC
  Administered 2021-04-16 – 2021-04-19 (×6): 40 mg via INTRAVENOUS
  Filled 2021-04-16 (×6): qty 1

## 2021-04-16 MED ORDER — PIPERACILLIN-TAZOBACTAM 3.375 G IVPB
3.3750 g | Freq: Two times a day (BID) | INTRAVENOUS | Status: DC
  Administered 2021-04-16 – 2021-04-17 (×2): 3.375 g via INTRAVENOUS
  Filled 2021-04-16 (×2): qty 50

## 2021-04-16 MED ORDER — CARVEDILOL 12.5 MG PO TABS
25.0000 mg | ORAL_TABLET | Freq: Two times a day (BID) | ORAL | Status: DC
  Administered 2021-04-16 – 2021-05-01 (×30): 25 mg via ORAL
  Filled 2021-04-16 (×12): qty 2
  Filled 2021-04-16: qty 8
  Filled 2021-04-16 (×17): qty 2

## 2021-04-16 MED ORDER — CLONIDINE HCL 0.2 MG PO TABS
0.2000 mg | ORAL_TABLET | Freq: Three times a day (TID) | ORAL | Status: DC
  Administered 2021-04-16 – 2021-04-17 (×2): 0.2 mg via ORAL
  Filled 2021-04-16 (×2): qty 1

## 2021-04-16 MED ORDER — FUROSEMIDE 10 MG/ML IJ SOLN
80.0000 mg | Freq: Every day | INTRAMUSCULAR | Status: DC
  Administered 2021-04-16 – 2021-04-18 (×3): 80 mg via INTRAVENOUS
  Filled 2021-04-16 (×3): qty 8

## 2021-04-16 MED ORDER — LABETALOL HCL 5 MG/ML IV SOLN
10.0000 mg | INTRAVENOUS | Status: DC | PRN
  Administered 2021-04-16 – 2021-04-17 (×5): 10 mg via INTRAVENOUS
  Filled 2021-04-16 (×5): qty 4

## 2021-04-16 MED ORDER — CARVEDILOL 12.5 MG PO TABS: 25.0000 mg | ORAL_TABLET | Freq: Three times a day (TID) | ORAL | Status: DC

## 2021-04-16 NOTE — Plan of Care (Signed)

## 2021-04-16 NOTE — Progress Notes (Signed)
Pharmacy Antibiotic Note  Keith Snow is a 60 y.o. male admitted on 04/09/2021 with  aspiration pneumonia .  Pharmacy has been consulted for zosyn dosing.  Plan: Zosyn 3.375g IV every 12 hours.  Height: 5\' 9"  (175.3 cm) Weight: 106.5 kg (234 lb 12.6 oz) IBW/kg (Calculated) : 70.7  Temp (24hrs), Avg:97.5 F (36.4 C), Min:97 F (36.1 C), Max:97.9 F (36.6 C)  Recent Labs  Lab 04/10/21 0009 04/10/21 0215 04/10/21 0611 04/10/21 1010 04/11/21 0932 04/11/21 1132 04/11/21 1411 04/13/21 0240 04/13/21 0647 04/14/21 0435 04/15/21 0459 04/16/21 0439  WBC 10.0  --  9.7  --   --   --   --   --  7.5 6.2 6.2 8.6  CREATININE 2.21* 2.04*  --    < > 1.91*  --    < > 2.85* 2.71* 3.17* 3.49* 3.84*  LATICACIDVEN 1.6 1.6  --   --  1.9 1.5  --   --   --   --   --   --    < > = values in this interval not displayed.     Estimated Creatinine Clearance: 25.2 mL/min (A) (by C-G formula based on SCr of 3.84 mg/dL (H)).    No Known Allergies  Antimicrobials this admission: Zosyn 9 /20 >> Azith/CTX 9/18 >> 9/20  Microbiology results: 9/17 BCx: ng 9/17 UCx: insignificant growth    Thank you for allowing pharmacy to be a part of this patient's care.  Ramond Craver 04/16/2021 11:58 AM

## 2021-04-16 NOTE — Progress Notes (Signed)
PROGRESS NOTE    Keith Snow  JXB:[id number] DOB: [birth year] DOA: 04/09/2021 PCP: System, Provider Not In   Brief Narrative:  60 year old with history of DM2, HTN, MI, CHF comes to the ED with change in mental status.  Upon admission he was noted to have focal consolidation in the right lower lung, elevated BNP.  He was also hyperglycemic.  CT of the chest showed combination of severe bilateral bronchopneumonia and possible effusion.  It has been difficult to diurese him due to worsening renal function.  Nephrology and Pulm consulted for their input. Started on Broad Spectrum Abx and Steroids. Cr continues to increase slowly.    Assessment & Plan:   Active Problems:   Acute respiratory failure with hypoxia (HCC)   Hyperglycemia   Type 2 diabetes mellitus with hypoglycemia without coma (HCC)   HTN (hypertension)   AKI (acute kidney injury) (HCC)   Acute on chronic respiratory failure with hypoxia and hypercapnia (HCC)  Acute hypoxic respiratory failure requiring BiPAP and HiFlow - Multifactorial.  Combination of COPD and CHF exacerbation. -CT without contrast- Severe b/l BronchoPNA with small b/l parapneumonic effusions.   Community-acquired pneumonia. Aspiration PNA?  Concerns for BOOP - Pro-Cal 0.54.  Cleared by speech and swallow for regular diet. - Currently on IV Zosyn, reduced dosing due to renal issues.  Pulmonary suggested Solu-Medrol 80 mg every 12. - Bronchodilators scheduled and as needed.  I-S/flutter -Speech and swallow eval-regular diet  Diabetes mellitus type 2, uncontrolled with hyperglycemia - A1c-10.3 - Continue long-acting insulin 42 units daily.  NovoLog 5 units Premeal - Sliding scale and Accu-Chek  Acute congestive heart failure with preserved EF, grade 2 DD, class III -Echo 60%, grade 2 DD, moderate LVH - Lasix to be given today by nephrology.  Aldactone and Delene Loll is currently on hold.  Acute kidney injury on CKD stage IIIa - Admission creatinine  1.8, 2.63 > 2.71 > 3.17 >3.49 >3.84.  Currently producing about 1 L of urine over last 24 hours.  BUN rising as well but this could also be secondary to steroids.  Potassium and bicarb are relatively stable.  Renal ultrasound is negative for acute pathology.  Spoke with nephrology today, patient and family open to dialysis if it becomes necessary. Dr Cecille Rubin suggested to touch base with Dr Justin Mend over the weekend if needed.   Acute metabolic encephalopathy -Resolved.   Essential hypertension -Coreg, nifedipine.  IV hydralazine  Hyperlipidemia - Crestor    DVT prophylaxis: Subcu heparin Code Status: Full code Family Communication: Wife updated over the phone yesterday.  Called her today, no answer. Will attempt to call her again later.   Status is: Inpatient  Remains inpatient appropriate because:Inpatient level of care appropriate due to severity of illness  Dispo: The patient is from: Home              Anticipated d/c is to: Home              Patient currently is not medically stable to d/c.   Difficult to place patient No   Subjective: Patient is sitting up in the chair feeling better, producing urine but renal function continues to increase.  He still remains on high flow oxygen.  Review of Systems Otherwise negative except as per HPI, including: General: Denies fever, chills, night sweats or unintended weight loss. Resp: Denies cough, wheezing, shortness of breath. Cardiac: Denies chest pain, palpitations, orthopnea, paroxysmal nocturnal dyspnea. GI: Denies abdominal pain, nausea, vomiting, diarrhea or constipation GU: Denies dysuria, frequency,  hesitancy or incontinence MS: Denies muscle aches, joint pain or swelling Neuro: Denies headache, neurologic deficits (focal weakness, numbness, tingling), abnormal gait Psych: Denies anxiety, depression, SI/HI/AVH Skin: Denies new rashes or lesions ID: Denies sick contacts, exotic exposures, travel  Examination: Constitutional: Not  in acute distress.  On high flow oxygen Respiratory: Diminished breath sounds bilaterally Cardiovascular: Normal sinus rhythm, no rubs Abdomen: Nontender nondistended good bowel sounds Musculoskeletal: No edema noted Skin: No rashes seen Neurologic: CN 2-12 grossly intact.  And nonfocal Psychiatric: Normal judgment and insight. Alert and oriented x 3. Normal mood.   Objective: Vitals:   04/16/21 0747 04/16/21 0800 04/16/21 0840 04/16/21 0900  BP:  (!) 169/98  (!) 161/84  Pulse:  66  73  Resp:  14  19  Temp: (!) 97.2 F (36.2 C)     TempSrc: Axillary     SpO2:  97% 91% 92%  Weight:      Height:        Intake/Output Summary (Last 24 hours) at 04/16/2021 1144 Last data filed at 04/16/2021 0911 Gross per 24 hour  Intake 1334.79 ml  Output 500 ml  Net 834.79 ml   Filed Weights   04/13/21 0450 04/14/21 0419 04/16/21 0500  Weight: 101.4 kg 102.1 kg 106.5 kg     Data Reviewed:   CBC: Recent Labs  Lab 04/10/21 0009 04/10/21 0611 04/13/21 0647 04/14/21 0435 04/15/21 0459 04/16/21 0439  WBC 10.0 9.7 7.5 6.2 6.2 8.6  NEUTROABS 8.2*  --   --   --   --   --   HGB 12.2* 11.5* 9.3* 9.1* 8.9* 8.6*  HCT 36.7* 34.8* 29.5* 28.2* 27.7* 27.0*  MCV 92.4 94.1 97.0 96.2 95.8 94.7  PLT 271 232 189 182 193 086   Basic Metabolic Panel: Recent Labs  Lab 04/13/21 0240 04/13/21 0647 04/14/21 0435 04/15/21 0459 04/16/21 0439  NA 139 138 134* 134* 133*  K 3.5 3.6 4.3 4.2 4.0  CL 110 108 102 102 100  CO2 23 19* 21* 19* 19*  GLUCOSE 170* 191* 350* 245* 191*  BUN 58* 60* 78* 97* 114*  CREATININE 2.85* 2.71* 3.17* 3.49* 3.84*  CALCIUM 7.2* 7.3* 7.7* 7.6* 7.3*  MG  --  2.3 2.6* 2.8* 2.8*  PHOS  --   --   --   --  5.6*   GFR: Estimated Creatinine Clearance: 25.2 mL/min (A) (by C-G formula based on SCr of 3.84 mg/dL (H)). Liver Function Tests: Recent Labs  Lab 04/10/21 0009 04/13/21 0647 04/14/21 0435 04/15/21 0459 04/16/21 0439  AST 21 13* 23 29  --   ALT 22 13 17 24   --    ALKPHOS 61 59 100 129*  --   BILITOT 0.9 1.0 0.7 0.7  --   PROT 7.2 6.4* 6.7 6.6  --   ALBUMIN 3.0* 2.6* 2.5* 2.5* 2.5*   No results for input(s): LIPASE, AMYLASE in the last 168 hours. No results for input(s): AMMONIA in the last 168 hours. Coagulation Profile: No results for input(s): INR, PROTIME in the last 168 hours. Cardiac Enzymes: No results for input(s): CKTOTAL, CKMB, CKMBINDEX, TROPONINI in the last 168 hours. BNP (last 3 results) No results for input(s): PROBNP in the last 8760 hours. HbA1C: No results for input(s): HGBA1C in the last 72 hours.  CBG: Recent Labs  Lab 04/15/21 1115 04/15/21 1610 04/15/21 2219 04/16/21 0327 04/16/21 0757  GLUCAP 328* 269* 161* 168* 188*   Lipid Profile: No results for input(s): CHOL, HDL, LDLCALC,  TRIG, CHOLHDL, LDLDIRECT in the last 72 hours. Thyroid Function Tests: No results for input(s): TSH, T4TOTAL, FREET4, T3FREE, THYROIDAB in the last 72 hours. Anemia Panel: Recent Labs    04/15/21 0459  FERRITIN 323  TIBC 244*  IRON 29*   Sepsis Labs: Recent Labs  Lab 04/10/21 0009 04/10/21 0215 04/11/21 0608 04/11/21 0932 04/11/21 1132 04/13/21 0647 04/15/21 0459  PROCALCITON  --   --  0.33  --   --  0.54 0.34  LATICACIDVEN 1.6 1.6  --  1.9 1.5  --   --     Recent Results (from the past 240 hour(s))  Urine Culture     Status: Abnormal   Collection Time: 04/09/21 12:07 AM   Specimen: Urine, Clean Catch  Result Value Ref Range Status   Specimen Description   Final    URINE, CLEAN CATCH Performed at [facility], [address], [place], [place] [address]    Special Requests   Final    NONE Performed at [facility], [address] [place], [place] [address]    Culture (A)  Final    <10,000 COLONIES/mL INSIGNIFICANT GROWTH Performed at [facility], [place] [address] [place], [place] [address]    Report Status 04/11/2021 FINAL  Final  Resp Panel by RT-PCR (Flu A&B, Covid) Nasopharyngeal Swab     Status: None    Collection Time: 04/09/21 12:10 AM   Specimen: Nasopharyngeal Swab; Nasopharyngeal(NP) swabs in vial transport medium  Result Value Ref Range Status   SARS Coronavirus 2 by RT PCR NEGATIVE NEGATIVE Final    Comment: (NOTE) SARS-CoV-2 target nucleic acids are NOT DETECTED.  The SARS-CoV-2 RNA is generally detectable in upper respiratory specimens during the acute phase of infection. The lowest concentration of SARS-CoV-2 viral copies this assay can detect is 138 copies/mL. A negative result does not preclude SARS-Cov-2 infection and should not be used as the sole basis for treatment or other patient management decisions. A negative result may occur with  improper specimen collection/handling, submission of specimen other than nasopharyngeal swab, presence of viral mutation(s) within the areas targeted by this assay, and inadequate number of viral copies(<138 copies/mL). A negative result must be combined with clinical observations, patient history, and epidemiological information. The expected result is Negative.  Fact Sheet for Patients:  EntrepreneurPulse.com.au  Fact Sheet for Healthcare Providers:  IncredibleEmployment.be  This test is no t yet approved or cleared by the [place] FDA and  has been authorized for detection and/or diagnosis of SARS-CoV-2 by FDA under an Emergency Use Authorization (EUA). This EUA will remain  in effect (meaning this test can be used) for the duration of the COVID-19 declaration under Section 564(b)(1) of the Act, 21 U.S.C.section 360bbb-3(b)(1), unless the authorization is terminated  or revoked sooner.       Influenza A by PCR NEGATIVE NEGATIVE Final   Influenza B by PCR NEGATIVE NEGATIVE Final    Comment: (NOTE) The Xpert Xpress SARS-CoV-2/FLU/RSV plus assay is intended as an aid in the diagnosis of influenza from Nasopharyngeal swab specimens and should not be used as a sole basis for treatment.  Nasal washings and aspirates are unacceptable for Xpert Xpress SARS-CoV-2/FLU/RSV testing.  Fact Sheet for Patients: EntrepreneurPulse.com.au  Fact Sheet for Healthcare Providers: IncredibleEmployment.be  This test is not yet approved or cleared by the [place] FDA and has been authorized for detection and/or diagnosis of SARS-CoV-2 by FDA under an Emergency Use Authorization (EUA). This EUA will remain in effect (meaning  this test can be used) for the duration of the COVID-19 declaration under Section 564(b)(1) of the Act, 21 U.S.C. section 360bbb-3(b)(1), unless the authorization is terminated or revoked.  Performed at [facility], [address] [place], [place] [address]   Culture, blood (routine x 2)     Status: None   Collection Time: 04/10/21 12:09 AM   Specimen: Right Antecubital; Blood  Result Value Ref Range Status   Specimen Description RIGHT ANTECUBITAL  Final   Special Requests   Final    BOTTLES DRAWN AEROBIC AND ANAEROBIC Blood Culture results may not be optimal due to an excessive volume of blood received in culture bottles   Culture   Final    NO GROWTH 5 DAYS Performed at [facility], [address] [place], [place] [address]    Report Status 04/15/2021 FINAL  Final  Culture, blood (routine x 2)     Status: None   Collection Time: 04/10/21 12:09 AM   Specimen: Left Antecubital; Blood  Result Value Ref Range Status   Specimen Description LEFT ANTECUBITAL  Final   Special Requests   Final    BOTTLES DRAWN AEROBIC AND ANAEROBIC Blood Culture results may not be optimal due to an excessive volume of blood received in culture bottles   Culture   Final    NO GROWTH 5 DAYS Performed at [facility], [address] [place], [place] [address]    Report Status 04/15/2021 FINAL  Final  MRSA Next Gen by PCR, Nasal     Status: None   Collection Time: 04/10/21  2:47 PM   Specimen: Nasal Mucosa; Nasal Swab  Result Value Ref  Range Status   MRSA by PCR Next Gen NOT DETECTED NOT DETECTED Final    Comment: (NOTE) The GeneXpert MRSA Assay (FDA approved for NASAL specimens only), is one component of a comprehensive MRSA colonization surveillance program. It is not intended to diagnose MRSA infection nor to guide or monitor treatment for MRSA infections. Test performance is not FDA approved in patients less than 69 years old. Performed at [facility], [address], [place],  [address]          Radiology Studies: US RENAL  Result Date: 04/14/2021 CLINICAL DATA:  Acute renal injury EXAM: RENAL / URINARY TRACT ULTRASOUND COMPLETE COMPARISON:  None. FINDINGS: Right Kidney: Renal measurements: 12.3 x 4.5 x 5.7 cm. = volume: 165 mL. Echogenicity within normal limits. No mass or hydronephrosis visualized. Left Kidney: Renal measurements: 10.9 x 6.0 x 4.9 cm. = volume: 66 mL. Echogenicity within normal limits. No mass or hydronephrosis visualized. Bladder: Bladder is well distended. Mild wall thickening is noted anteriorly measuring up to 9 mm. This could be related to underlying UTI. Clinical correlation is recommended. Other: None. IMPRESSION: Normal-appearing kidneys bilaterally. Mild bladder wall thickening which may be inflammatory in nature. Clinical correlation is recommended. Electronically Signed   By: Inez Catalina M.D.   On: 04/14/2021 15:54   DG Chest Port 1 View  Result Date: 04/14/2021 CLINICAL DATA:  Shortness of breath, history of stroke, diabetes mellitus, hypertension, MI, cancer EXAM: PORTABLE CHEST 1 VIEW COMPARISON:  Portable exam 1340 hours compared to 04/10/2021 FINDINGS: Enlargement of cardiac silhouette with pulmonary vascular congestion. Stable mediastinal contours. BILATERAL pulmonary infiltrates question pulmonary edema, slightly greater at RIGHT base. Small BILATERAL pleural effusions. No pneumothorax or acute osseous findings. IMPRESSION: Enlargement of cardiac silhouette with pulmonary  vascular congestion and mild pulmonary edema. Small BILATERAL pleural effusions. Electronically Signed  By: Lavonia Dana M.D.   On: 04/14/2021 16:01        Scheduled Meds:  aspirin EC  81 mg Oral Daily   buPROPion  100 mg Oral BID   carvedilol  25 mg Oral BID WC   Chlorhexidine Gluconate Cloth  6 each Topical Daily   ferrous sulfate  325 mg Oral Q breakfast   furosemide  80 mg Intravenous Daily   heparin  5,000 Units Subcutaneous Q8H   hydrALAZINE  100 mg Oral Q8H   insulin aspart  0-15 Units Subcutaneous TID WC   insulin aspart  0-5 Units Subcutaneous QHS   insulin aspart  5 Units Subcutaneous TID WC   insulin glargine-yfgn  42 Units Subcutaneous Daily   ipratropium-albuterol  3 mL Nebulization Q6H   mouth rinse  15 mL Mouth Rinse BID   methylPREDNISolone (SOLU-MEDROL) injection  80 mg Intravenous Q12H   pantoprazole  40 mg Oral Daily   polyethylene glycol  17 g Oral BID   rosuvastatin  20 mg Oral Daily   Continuous Infusions:  piperacillin-tazobactam (ZOSYN)  IV 3.375 g (04/16/21 0911)     LOS: 6 days   Time spent= 35 mins    Kapil Petropoulos Arsenio Loader, MD [facility]  If 7PM-7AM, please contact night-coverage  04/16/2021, 11:44 AM

## 2021-04-16 NOTE — Progress Notes (Signed)
Chart reviewed, defer management of volume status to nephrology. Agree with trial of gentle IVFs given progressing renal dysfunction. Abx for pneumonia per primary team and pulmonary, he is is also on high dose IV steroids. High bp's may be related to steroids, medical therapy limited due to renal dysfunction, pulmonary had recommended avoiding CCBs due to potential for increased pulmonary shunting. Reasonable to continue prn clonidine along with his coreg and hydralazine. His home aldactone and entresto remain on hold due to AKI.    Carlyle Dolly MD

## 2021-04-16 NOTE — Progress Notes (Addendum)
[facility] Progress Note  Name: Alfredo Spong MRN: [id number] DOB: [birth year]  Chief Complaint:  Weakness, high glucose  Subjective:  he has been in ICU.  He had 900 ml UOP over 9/22.  I spoke with the patient's wife via his speakerphone at his bedside.  He would want dialysis if it were needed- hopes to hold off today  Review of systems:   Reports shortness of breath Denies chest pain Denies n/v/d; actually has had constipation   --------- Background on consult:  Kelwin Gibler is an 60 y.o. male with a PMH significant for longstanding, poorly controlled DM type 2, HTN, chronic Diastolic CHF (DD grade II), h/o covid-19 PNA in 8/20, and CKD stage IIIb (baseline Scr 1.68-2.25) who presented to [facility] ED with weakness, fatigue, and elevated glucose readings >500.  His mother told the ED that he has been having increased sleep, polyuria, and polydipsia for the preceding 24 hrs.  In the ED he was febrile at 100.6, BP 165/89, SpO2 73%, and was noted to be somnolent and in some respiratory distress.  He did have lower extremity edema upon initial examination.  Labs were notable for WBC 10, BUN 54, Cr 2.21, gluc 550, BNP 1141, negative covid test, CXR with focal consolidation in RLL.  He was admitted under sepsis protocol and started on broad spectrum antibiotics, currently on zosyn and prednisone.  We were consulted due to the development of worsening kidney function following IV diuresis.  The trend is seen below.  He was continued on spironolactone and entresto but was held on the 3rd day of admission due to rising SCr.  IV lasix was stopped on 04/12/21.      Intake/Output Summary (Last 24 hours) at 04/16/2021 0936 Last data filed at 04/16/2021 0911 Gross per 24 hour  Intake 1334.79 ml  Output 500 ml  Net 834.79 ml    Vitals:  Vitals:   04/16/21 0747 04/16/21 0800 04/16/21 0840 04/16/21 0900  BP:  (!) 169/98  (!) 161/84  Pulse:  66  73  Resp:  14  19  Temp: (!) 97.2 F (36.2  C)     TempSrc: Axillary     SpO2:  97% 91% 92%  Weight:      Height:         Physical Exam:   General adult male seated in chair HEENT normocephalic atraumatic extraocular movements intact sclera anicteric; small hemorrhage right eye Neck supple trachea midline Lungs reduced to auscultation bilaterally and basilar crackles greatest left; increased work of breathing with exertion on supp oxygen Heart S1S2 no rub Abdomen soft nontender nondistended Extremities no edema appreciated Psych normal mood and affect Neuro alert and oriented x 3 provides hx and follows commands Gu - no foley   Medications reviewed   Labs:  BMP Latest Ref Rng & Units 04/16/2021 04/15/2021 04/14/2021  Glucose 70 - 99 mg/dL 191(H) 245(H) 350(H)  BUN 6 - 20 mg/dL 114(H) 97(H) 78(H)  Creatinine 0.61 - 1.24 mg/dL 3.84(H) 3.49(H) 3.17(H)  Sodium 135 - 145 mmol/L 133(L) 134(L) 134(L)  Potassium 3.5 - 5.1 mmol/L 4.0 4.2 4.3  Chloride 98 - 111 mmol/L 100 102 102  CO2 22 - 32 mmol/L 19(L) 19(L) 21(L)  Calcium 8.9 - 10.3 mg/dL 7.3(L) 7.6(L) 7.7(L)     Assessment/Plan:   Assessment/Plan:  AKI on CKD - presumably hemodynamically mediated in setting of transient hypotension with ongoing IV diuresis and concomitant use of ARB and spironolactone.  Diuretics and ARB on hold now.  Exam not consistent with volume overload.  renal US 12.3 and 10.9 cm kidneys no hydro and normal echogenicity. Bladder scan 70 mL.  UA trace protein and 0-5 RBC, high specific gravity. ANA was sent per team and neg No emergent indication for dialysis at this time but may be approaching need if no improvement.  His BUN continues to rise but 2/2 steroids as well  Lasix 80 mg IV once now and daily for now Would reduce zosyn to 2.25 gram every 8 hours per primary team discretion  Changed to renal diet CKD stage 3 b - note baseline Cr 1.7- 2.1 Acute hypoxic respiratory failure due to bilateral bronchopneumonia -  his SOB is most likely related to  his pneumonia.  preserved EF.  pulm has seen.  I am cautiously restarting lasix to optimize Chronic diastolic CHF - Cardiology following.  Agree difficult to assess given history of chronically elevated BNP and now with bilaterally.  Lasix as above Poorly controlled DM with Hyperglycemia - due to acute infection and steroids, per primary.  Historically Hgb A1c has been >10% AMS - per primary team is improved HTN - off of entresto, bumex, and spironolactone from his home meds (according to 03/22/21 ov with his cardiology at the [facility]).  Avoid hypotension DM - with hyperglycemia - control per primary team  Anemia - normocytic - with component of iron deficiency. See started on oral iron    Nephrology will follow over the weekend and plan for in-person eval as permitted  Claudia Desanctis, MD 04/16/2021 9:36 AM
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
## 2021-04-16 NOTE — Care Management Important Message (Signed)
Important Message  Patient Details  Name: Keith Snow MRN: [id number] Date of Birth: 04-12-1961   Medicare Important Message Given:  Yes     [name] 04/16/2021, 4:32 PM

## 2021-04-17 ENCOUNTER — Inpatient Hospital Stay: Payer: Self-pay

## 2021-04-17 DIAGNOSIS — E8779 Other fluid overload: Secondary | ICD-10-CM

## 2021-04-17 DIAGNOSIS — E8770 Fluid overload, unspecified: Secondary | ICD-10-CM

## 2021-04-17 DIAGNOSIS — J9621 Acute and chronic respiratory failure with hypoxia: Secondary | ICD-10-CM | POA: Diagnosis not present

## 2021-04-17 DIAGNOSIS — J9622 Acute and chronic respiratory failure with hypercapnia: Secondary | ICD-10-CM | POA: Diagnosis not present

## 2021-04-17 LAB — CBC
HCT: 27.5 % — ABNORMAL LOW (ref 39.0–52.0)
Hemoglobin: 8.8 g/dL — ABNORMAL LOW (ref 13.0–17.0)
MCH: 30.1 pg (ref 26.0–34.0)
MCHC: 32 g/dL (ref 30.0–36.0)
MCV: 94.2 fL (ref 80.0–100.0)
Platelets: 254 10*3/uL (ref 150–400)
RBC: 2.92 MIL/uL — ABNORMAL LOW (ref 4.22–5.81)
RDW: 13.4 % (ref 11.5–15.5)
WBC: 8.6 10*3/uL (ref 4.0–10.5)
nRBC: 0 % (ref 0.0–0.2)

## 2021-04-17 LAB — GLUCOSE, CAPILLARY
Glucose-Capillary: 175 mg/dL — ABNORMAL HIGH (ref 70–99)
Glucose-Capillary: 264 mg/dL — ABNORMAL HIGH (ref 70–99)
Glucose-Capillary: 287 mg/dL — ABNORMAL HIGH (ref 70–99)
Glucose-Capillary: 232 mg/dL — ABNORMAL HIGH (ref 70–99)
Glucose-Capillary: 119 mg/dL — ABNORMAL HIGH (ref 70–99)

## 2021-04-17 LAB — BASIC METABOLIC PANEL
Anion gap: 12 (ref 5–15)
BUN: 126 mg/dL — ABNORMAL HIGH (ref 6–20)
CO2: 20 mmol/L — ABNORMAL LOW (ref 22–32)
Calcium: 7.4 mg/dL — ABNORMAL LOW (ref 8.9–10.3)
Chloride: 103 mmol/L (ref 98–111)
Creatinine, Ser: 3.29 mg/dL — ABNORMAL HIGH (ref 0.61–1.24)
GFR, Estimated: 21 mL/min — ABNORMAL LOW (ref >60)
Glucose, Bld: 237 mg/dL — ABNORMAL HIGH (ref 70–99)
Potassium: 3.7 mmol/L (ref 3.5–5.1)
Sodium: 135 mmol/L (ref 135–145)

## 2021-04-17 LAB — MAGNESIUM: Magnesium: 2.9 mg/dL — ABNORMAL HIGH (ref 1.7–2.4)

## 2021-04-17 MED ORDER — PIPERACILLIN-TAZOBACTAM 3.375 G IVPB
3.3750 g | Freq: Three times a day (TID) | INTRAVENOUS | Status: DC
  Administered 2021-04-17 – 2021-04-20 (×8): 3.375 g via INTRAVENOUS
  Filled 2021-04-17 (×8): qty 50

## 2021-04-17 MED ORDER — BUDESONIDE 0.5 MG/2ML IN SUSP
0.5000 mg | Freq: Two times a day (BID) | RESPIRATORY_TRACT | Status: DC
  Administered 2021-04-17 – 2021-05-01 (×28): 0.5 mg via RESPIRATORY_TRACT
  Filled 2021-04-17 (×28): qty 2

## 2021-04-17 MED ORDER — LABETALOL HCL 5 MG/ML IV SOLN
20.0000 mg | INTRAVENOUS | Status: DC | PRN
  Administered 2021-04-19 – 2021-04-26 (×8): 20 mg via INTRAVENOUS
  Filled 2021-04-17 (×8): qty 4

## 2021-04-17 MED ORDER — CLONIDINE HCL 0.2 MG PO TABS
0.3000 mg | ORAL_TABLET | Freq: Three times a day (TID) | ORAL | Status: DC
  Administered 2021-04-17 – 2021-05-01 (×43): 0.3 mg via ORAL
  Filled 2021-04-17 (×49): qty 1
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
## 2021-04-17 NOTE — Plan of Care (Signed)

## 2021-04-17 NOTE — Progress Notes (Signed)
PROGRESS NOTE  Keith Snow XLK:[id number] DOB: Oct 28, 1960 DOA: 04/09/2021 PCP: System, Provider Not In  Brief History:  60 year old with history of DM2, HTN, MI, CHF comes to the ED with change in mental status.  Upon admission he was noted to have focal consolidation in the right lower lung, elevated BNP.  He was also hyperglycemic.  CT of the chest showed combination of severe bilateral bronchopneumonia and possible effusion.  It has been difficult to diurese him due to worsening renal function.  Nephrology and Pulm consulted for their input. Started on Broad Spectrum Abx and Steroids. Cr continues to increase slowly.   Assessment/Plan: Acute hypoxic respiratory failure requiring BiPAP and HiFlow - Multifactorial.  Combination of PNA, COPD and CHF exacerbation. -9/19 CT chest Severe b/l BronchoPNA with small b/l parapneumonic effusions.    Community-acquired pneumonia. Aspiration PNA?  Concerns for BOOP - Pro-Cal 0.54>>0.34  -Cleared by speech and swallow for regular diet. - Currently on IV Zosyn 7/20>>  Pulmonary suggested Solu-Medrol 80 mg every 12>>40mg   q 12 - Bronchodilators scheduled and as needed.  I-S/flutter -Speech and swallow eval-regular diet -MRSA neg   Diabetes mellitus type 2, uncontrolled with hyperglycemia - 9/17 A1c-10.3 - Continue long-acting insulin 42 units daily.  NovoLog 5 units Premeal - Sliding scale and Accu-Chek   Acute on chronic congestive heart failure with preserved EF, grade 2 DD, class III -04/10/21 Echo 55-60%, grade 2 DD, moderate LVH - Lasix IV by nephrology.   -previously on Entresto, spironolactone which are held due to AKI   Acute kidney injury on CKD stage IIIa - Admission creatinine 1.8, 2.63 > 2.71 > 3.17 >3.49 >3.84.  -Currently producing about 1 L of urine over last 24 hours.   -BUN rising as well but this could also be secondary to steroids.  Potassium and bicarb are relatively stable.   -Renal ultrasound is negative for  acute pathology.   -appreciate renal follow up -he does not look to be florridly fluid overloaded -previously on Bumex 2 mg bid when he lived in [place]   Acute metabolic encephalopathy -Resolved.    Essential hypertension -Coreg,  hydralazine, clonidine -nifedipine stopped due to concerns for shunt  Hyperlipidemia - Crestor        Status is: Inpatient  Remains inpatient appropriate because:Inpatient level of care appropriate due to severity of illness  Dispo: The patient is from: Home              Anticipated d/c is to: Home              Patient currently is not medically stable to d/c.   Difficult to place patient No        Family Communication:   no Family at bedside  Consultants:  pulm  Code Status:  FULL   DVT Prophylaxis:  [place] Heparin   Procedures: As Listed in Progress Note Above  Antibiotics: None       Subjective:  Patient complains of sob with minimal exertion.  Denies cp, n/v/d, abd pain, hemoptysis. Objective: Vitals:   04/17/21 0200 04/17/21 0300 04/17/21 0500 04/17/21 0600  BP: (!) 169/86 (!) 176/84  (!) 167/77  Pulse: 63 65  70  Resp: 13 12  14   Temp:      TempSrc:      SpO2: 95% 95%  94%  Weight:   106.4 kg   Height:        Intake/Output Summary (Last 24 hours)  at 04/17/2021 0904 Last data filed at 04/17/2021 0446 Gross per 24 hour  Intake 1045.04 ml  Output 1975 ml  Net -929.96 ml   Weight change: -0.1 kg Exam:  General:  Pt is alert, follows commands appropriately, not in acute distress HEENT: No icterus, No thrush, No neck mass, [place]/AT Cardiovascular: RRR, S1/S2, no rubs, no gallops Respiratory: bilateral crackles.  Bibasilar wheeze Abdomen: Soft/+BS, non tender, non distended, no guarding Extremities: trace LE edema, No lymphangitis, No petechiae, No rashes, no synovitis   Data Reviewed: I have personally reviewed following labs and imaging studies Basic Metabolic Panel: Recent Labs  Lab 04/13/21 0240  04/13/21 0647 04/14/21 0435 04/15/21 0459 04/16/21 0439  NA 139 138 134* 134* 133*  K 3.5 3.6 4.3 4.2 4.0  CL 110 108 102 102 100  CO2 23 19* 21* 19* 19*  GLUCOSE 170* 191* 350* 245* 191*  BUN 58* 60* 78* 97* 114*  CREATININE 2.85* 2.71* 3.17* 3.49* 3.84*  CALCIUM 7.2* 7.3* 7.7* 7.6* 7.3*  MG  --  2.3 2.6* 2.8* 2.8*  PHOS  --   --   --   --  5.6*   Liver Function Tests: Recent Labs  Lab 04/13/21 0647 04/14/21 0435 04/15/21 0459 04/16/21 0439  AST 13* 23 29  --   ALT 13 17 24   --   ALKPHOS 59 100 129*  --   BILITOT 1.0 0.7 0.7  --   PROT 6.4* 6.7 6.6  --   ALBUMIN 2.6* 2.5* 2.5* 2.5*   No results for input(s): LIPASE, AMYLASE in the last 168 hours. No results for input(s): AMMONIA in the last 168 hours. Coagulation Profile: No results for input(s): INR, PROTIME in the last 168 hours. CBC: Recent Labs  Lab 04/13/21 0647 04/14/21 0435 04/15/21 0459 04/16/21 0439 04/17/21 0649  WBC 7.5 6.2 6.2 8.6 8.6  HGB 9.3* 9.1* 8.9* 8.6* 8.8*  HCT 29.5* 28.2* 27.7* 27.0* 27.5*  MCV 97.0 96.2 95.8 94.7 94.2  PLT 189 182 193 211 254   Cardiac Enzymes: No results for input(s): CKTOTAL, CKMB, CKMBINDEX, TROPONINI in the last 168 hours. BNP: Invalid input(s): POCBNP CBG: Recent Labs  Lab 04/16/21 1205 04/16/21 1714 04/16/21 2203 04/17/21 0342 04/17/21 0736  GLUCAP 302* 216* 173* 175* 264*   HbA1C: No results for input(s): HGBA1C in the last 72 hours. Urine analysis:    Component Value Date/Time   COLORURINE YELLOW 04/14/2021 1803   APPEARANCEUR CLEAR 04/14/2021 1803   LABSPEC >1.030 (H) 04/14/2021 1803   PHURINE 5.0 04/14/2021 1803   GLUCOSEU NEGATIVE 04/14/2021 1803   HGBUR NEGATIVE 04/14/2021 1803   BILIRUBINUR NEGATIVE 04/14/2021 1803   KETONESUR NEGATIVE 04/14/2021 1803   PROTEINUR TRACE (A) 04/14/2021 1803   NITRITE NEGATIVE 04/14/2021 1803   LEUKOCYTESUR NEGATIVE 04/14/2021 1803   Sepsis Labs: @LABRCNTIP (procalcitonin:4,lacticidven:4) ) Recent  Results (from the past 240 hour(s))  Urine Culture     Status: Abnormal   Collection Time: 04/09/21 12:07 AM   Specimen: Urine, Clean Catch  Result Value Ref Range Status   Specimen Description   Final    URINE, CLEAN CATCH Performed at [facility], [address], [place], [place] [address]    Special Requests   Final    NONE Performed at [facility], [address], [place], [place] [address]    Culture (A)  Final    <10,000 COLONIES/mL INSIGNIFICANT GROWTH Performed at [facility], [place] [address], [place], [place] [address]    Report Status  04/11/2021 FINAL  Final  Resp Panel by RT-PCR (Flu A&B, Covid) Nasopharyngeal Swab     Status: None   Collection Time: 04/09/21 12:10 AM   Specimen: Nasopharyngeal Swab; Nasopharyngeal(NP) swabs in vial transport medium  Result Value Ref Range Status   SARS Coronavirus 2 by RT PCR NEGATIVE NEGATIVE Final    Comment: (NOTE) SARS-CoV-2 target nucleic acids are NOT DETECTED.  The SARS-CoV-2 RNA is generally detectable in upper respiratory specimens during the acute phase of infection. The lowest concentration of SARS-CoV-2 viral copies this assay can detect is 138 copies/mL. A negative result does not preclude SARS-Cov-2 infection and should not be used as the sole basis for treatment or other patient management decisions. A negative result may occur with  improper specimen collection/handling, submission of specimen other than nasopharyngeal swab, presence of viral mutation(s) within the areas targeted by this assay, and inadequate number of viral copies(<138 copies/mL). A negative result must be combined with clinical observations, patient history, and epidemiological information. The expected result is Negative.  Fact Sheet for Patients:  EntrepreneurPulse.com.au  Fact Sheet for Healthcare Providers:  IncredibleEmployment.be  This test is no t yet approved or cleared by the [place] FDA  and  has been authorized for detection and/or diagnosis of SARS-CoV-2 by FDA under an Emergency Use Authorization (EUA). This EUA will remain  in effect (meaning this test can be used) for the duration of the COVID-19 declaration under Section 564(b)(1) of the Act, 21 U.S.C.section 360bbb-3(b)(1), unless the authorization is terminated  or revoked sooner.       Influenza A by PCR NEGATIVE NEGATIVE Final   Influenza B by PCR NEGATIVE NEGATIVE Final    Comment: (NOTE) The Xpert Xpress SARS-CoV-2/FLU/RSV plus assay is intended as an aid in the diagnosis of influenza from Nasopharyngeal swab specimens and should not be used as a sole basis for treatment. Nasal washings and aspirates are unacceptable for Xpert Xpress SARS-CoV-2/FLU/RSV testing.  Fact Sheet for Patients: EntrepreneurPulse.com.au  Fact Sheet for Healthcare Providers: IncredibleEmployment.be  This test is not yet approved or cleared by the [place] FDA and has been authorized for detection and/or diagnosis of SARS-CoV-2 by FDA under an Emergency Use Authorization (EUA). This EUA will remain in effect (meaning this test can be used) for the duration of the COVID-19 declaration under Section 564(b)(1) of the Act, 21 U.S.C. section 360bbb-3(b)(1), unless the authorization is terminated or revoked.  Performed at [facility], [address], [place], [place] [address]   Culture, blood (routine x 2)     Status: None   Collection Time: 04/10/21 12:09 AM   Specimen: Right Antecubital; Blood  Result Value Ref Range Status   Specimen Description RIGHT ANTECUBITAL  Final   Special Requests   Final    BOTTLES DRAWN AEROBIC AND ANAEROBIC Blood Culture results may not be optimal due to an excessive volume of blood received in culture bottles   Culture   Final    NO GROWTH 5 DAYS Performed at [facility], [address], [place], [place] [address]    Report Status 04/15/2021 FINAL  Final   Culture, blood (routine x 2)     Status: None   Collection Time: 04/10/21 12:09 AM   Specimen: Left Antecubital; Blood  Result Value Ref Range Status   Specimen Description LEFT ANTECUBITAL  Final   Special Requests   Final    BOTTLES DRAWN AEROBIC AND ANAEROBIC Blood Culture results may not be optimal due to an excessive volume of  blood received in culture bottles   Culture   Final    NO GROWTH 5 DAYS Performed at [facility], [address], [place], [place] [address]    Report Status 04/15/2021 FINAL  Final  MRSA Next Gen by PCR, Nasal     Status: None   Collection Time: 04/10/21  2:47 PM   Specimen: Nasal Mucosa; Nasal Swab  Result Value Ref Range Status   MRSA by PCR Next Gen NOT DETECTED NOT DETECTED Final    Comment: (NOTE) The GeneXpert MRSA Assay (FDA approved for NASAL specimens only), is one component of a comprehensive MRSA colonization surveillance program. It is not intended to diagnose MRSA infection nor to guide or monitor treatment for MRSA infections. Test performance is not FDA approved in patients less than 43 years old. Performed at [facility], [address] [place], [place] [address]   Expectorated Sputum Assessment w Gram Stain, Rflx to Resp Cult     Status: None   Collection Time: 04/16/21  7:00 PM   Specimen: Sputum  Result Value Ref Range Status   Specimen Description SPUTUM  Final   Special Requests NONE  Final   Sputum evaluation   Final    THIS SPECIMEN IS ACCEPTABLE FOR SPUTUM CULTURE PERFORMED AT [facility] Performed at [facility], [address], [place], [place] [address]    Report Status 04/16/2021 FINAL  Final     Scheduled Meds:  aspirin EC  81 mg Oral Daily   buPROPion  100 mg Oral BID   carvedilol  25 mg Oral BID WC   Chlorhexidine Gluconate Cloth  6 each Topical Daily   cloNIDine  0.2 mg Oral TID   ferrous sulfate  325 mg Oral Q breakfast   furosemide  80 mg Intravenous Daily   heparin  5,000 Units Subcutaneous Q8H   hydrALAZINE   100 mg Oral Q8H   insulin aspart  0-15 Units Subcutaneous TID WC   insulin aspart  0-5 Units Subcutaneous QHS   insulin aspart  5 Units Subcutaneous TID WC   insulin glargine-yfgn  42 Units Subcutaneous Daily   ipratropium-albuterol  3 mL Nebulization Q6H   mouth rinse  15 mL Mouth Rinse BID   methylPREDNISolone (SOLU-MEDROL) injection  40 mg Intravenous Q12H   pantoprazole  40 mg Oral Daily   polyethylene glycol  17 g Oral BID   rosuvastatin  20 mg Oral Daily   Continuous Infusions:  piperacillin-tazobactam (ZOSYN)  IV 3.375 g (04/16/21 2222)    Procedures/Studies: CT CHEST WO CONTRAST  Result Date: 04/12/2021 CLINICAL DATA:  60 year old male with history of acute hypoxic respiratory failure requiring BiPAP. EXAM: CT CHEST WITHOUT CONTRAST TECHNIQUE: Multidetector CT imaging of the chest was performed following the standard protocol without IV contrast. COMPARISON:  No priors. FINDINGS: Cardiovascular: Heart size is normal. There is no significant pericardial fluid, thickening or pericardial calcification. There is aortic atherosclerosis, as well as atherosclerosis of the great vessels of the mediastinum and the coronary arteries, including calcified atherosclerotic plaque in the left anterior descending and left circumflex coronary arteries. Mediastinum/Nodes: Multiple prominent borderline enlarged mediastinal and bilateral hilar lymph nodes are noted, nonspecific and likely reactive. Esophagus is unremarkable in appearance. No axillary lymphadenopathy. Lungs/Pleura: Small bilateral pleural effusions (right greater than left) lying dependently. Patchy multifocal airspace consolidation most evident in a peribronchovascular distribution with surrounding ground-glass attenuation and septal thickening. Upper Abdomen: Unremarkable. Musculoskeletal: There are no aggressive appearing lytic or blastic lesions noted in the visualized portions of  the skeleton. Multiple old healed rib fractures.  IMPRESSION: 1. Severe multilobar bilateral bronchopneumonia with small bilateral parapneumonic pleural effusions lying dependently, as above. 2. Aortic atherosclerosis, in addition to 2 vessel coronary artery disease. Please note that although the presence of coronary artery calcium documents the presence of coronary artery disease, the severity of this disease and any potential stenosis cannot be assessed on this non-gated CT examination. Assessment for potential risk factor modification, dietary therapy or pharmacologic therapy may be warranted, if clinically indicated. Aortic Atherosclerosis (ICD10-I70.0). Electronically Signed   By: Vinnie Langton M.D.   On: 04/12/2021 18:58   US RENAL  Result Date: 04/14/2021 CLINICAL DATA:  Acute renal injury EXAM: RENAL / URINARY TRACT ULTRASOUND COMPLETE COMPARISON:  None. FINDINGS: Right Kidney: Renal measurements: 12.3 x 4.5 x 5.7 cm. = volume: 165 mL. Echogenicity within normal limits. No mass or hydronephrosis visualized. Left Kidney: Renal measurements: 10.9 x 6.0 x 4.9 cm. = volume: 66 mL. Echogenicity within normal limits. No mass or hydronephrosis visualized. Bladder: Bladder is well distended. Mild wall thickening is noted anteriorly measuring up to 9 mm. This could be related to underlying UTI. Clinical correlation is recommended. Other: None. IMPRESSION: Normal-appearing kidneys bilaterally. Mild bladder wall thickening which may be inflammatory in nature. Clinical correlation is recommended. Electronically Signed   By: Inez Catalina M.D.   On: 04/14/2021 15:54   DG Chest Port 1 View  Result Date: 04/14/2021 CLINICAL DATA:  Shortness of breath, history of stroke, diabetes mellitus, hypertension, MI, cancer EXAM: PORTABLE CHEST 1 VIEW COMPARISON:  Portable exam 1340 hours compared to 04/10/2021 FINDINGS: Enlargement of cardiac silhouette with pulmonary vascular congestion. Stable mediastinal contours. BILATERAL pulmonary infiltrates question pulmonary  edema, slightly greater at RIGHT base. Small BILATERAL pleural effusions. No pneumothorax or acute osseous findings. IMPRESSION: Enlargement of cardiac silhouette with pulmonary vascular congestion and mild pulmonary edema. Small BILATERAL pleural effusions. Electronically Signed   By: Lavonia Dana M.D.   On: 04/14/2021 16:01   DG CHEST PORT 1 VIEW  Result Date: 04/10/2021 CLINICAL DATA:  Weakness and fatigue. EXAM: PORTABLE CHEST 1 VIEW COMPARISON:  04/09/2021 FINDINGS: 0657 hours. The cardio pericardial silhouette is enlarged. Focal airspace disease again noted right base with some probable atelectasis or infiltrate in the retrocardiac left base. There is pulmonary vascular congestion without overt pulmonary edema. Superimposed component of interstitial pulmonary edema suspected. IMPRESSION: Asymmetric focal airspace disease right base with some probable atelectasis or infiltrate in the retrocardiac left base. Interstitial edema not excluded. Electronically Signed   By: Misty Stanley M.D.   On: 04/10/2021 07:33   DG Chest Port 1 View  Result Date: 04/09/2021 CLINICAL DATA:  Fever and weakness EXAM: PORTABLE CHEST 1 VIEW COMPARISON:  None. FINDINGS: The heart and mediastinal contours are within normal limits. Aortic calcification. Right lower lung zone focal consolidation. Question retrocardiac opacity. No pulmonary edema. Possible trace left pleural effusion. No right pleural effusion. No pneumothorax. No acute osseous abnormality. IMPRESSION: Right lower lung zone focal consolidation. Question retrocardiac opacity with possible trace left pleural effusion. Findings consistent with infection/inflammation. Followup PA and lateral chest X-ray is recommended in 3-4 weeks following therapy to ensure resolution and exclude underlying malignancy. Electronically Signed   By: Iven Finn M.D.   On: 04/09/2021 23:59   ECHOCARDIOGRAM COMPLETE  Result Date: 04/10/2021    ECHOCARDIOGRAM REPORT   Patient Name:    Keith Snow Date of Exam: 04/10/2021 Medical Rec #:  [id number]    Height:  69.0 in Accession #:    [id number]   Weight:       217.0 lb Date of Birth:  11-May-1961    BSA:          2.139 m Patient Age:    58 years     BP:           142/83 mmHg Patient Gender: M            HR:           85 bpm. Exam Location:  [facility] Procedure: 2D Echo, Cardiac Doppler and Color Doppler Indications:    SBE I33.9  History:        Patient has no prior history of Echocardiogram examinations.                 Previous Myocardial Infarction, Stroke; Risk                 Factors:Hypertension and Diabetes. Hx of cancer. TBI (traumatic                 brain injury) ([place]) (From Hx).  Sonographer:    Alvino Chapel RCS Referring Phys: [place]  1. Left ventricular ejection fraction, by estimation, is 55 to 60%. The left ventricle has normal function. The left ventricle has no regional wall motion abnormalities. There is moderate concentric left ventricular hypertrophy. Left ventricular diastolic parameters are consistent with Grade II diastolic dysfunction (pseudonormalization).  2. Right ventricular systolic function is normal. The right ventricular size is normal. There is normal pulmonary artery systolic pressure. The estimated right ventricular systolic pressure is 25.0 mmHg.  3. Left atrial size was moderately dilated.  4. Right atrial size was mildly dilated.  5. The mitral valve is grossly normal. No evidence of mitral valve regurgitation. No evidence of mitral stenosis.  6. The aortic valve is tricuspid. Aortic valve regurgitation is not visualized. No aortic stenosis is present.  7. The inferior vena cava is normal in size with greater than 50% respiratory variability, suggesting right atrial pressure of 3 mmHg. FINDINGS  Left Ventricle: Left ventricular ejection fraction, by estimation, is 55 to 60%. The left ventricle has normal function. The left ventricle has no regional wall motion abnormalities.  The left ventricular internal cavity size was normal in size. There is  moderate concentric left ventricular hypertrophy. Left ventricular diastolic parameters are consistent with Grade II diastolic dysfunction (pseudonormalization). Right Ventricle: The right ventricular size is normal. No increase in right ventricular wall thickness. Right ventricular systolic function is normal. There is normal pulmonary artery systolic pressure. The tricuspid regurgitant velocity is 2.10 m/s, and  with an assumed right atrial pressure of 3 mmHg, the estimated right ventricular systolic pressure is 03.7 mmHg. Left Atrium: Left atrial size was moderately dilated. Right Atrium: Right atrial size was mildly dilated. Pericardium: There is no evidence of pericardial effusion. Mitral Valve: The mitral valve is grossly normal. No evidence of mitral valve regurgitation. No evidence of mitral valve stenosis. Tricuspid Valve: The tricuspid valve is grossly normal. Tricuspid valve regurgitation is trivial. No evidence of tricuspid stenosis. Aortic Valve: The aortic valve is tricuspid. Aortic valve regurgitation is not visualized. No aortic stenosis is present. Pulmonic Valve: The pulmonic valve was grossly normal. Pulmonic valve regurgitation is not visualized. No evidence of pulmonic stenosis. Aorta: The aortic root is normal in size and structure. Venous: The inferior vena cava is normal in size with greater than 50% respiratory variability, suggesting right  atrial pressure of 3 mmHg. IAS/Shunts: The atrial septum is grossly normal.  LEFT VENTRICLE PLAX 2D LVIDd:         4.70 cm  Diastology LVIDs:         3.10 cm  LV e' medial:    6.74 cm/s LV PW:         1.50 cm  LV E/e' medial:  16.5 LV IVS:        1.50 cm  LV e' lateral:   9.14 cm/s LVOT diam:     2.00 cm  LV E/e' lateral: 12.1 LV SV:         65 LV SV Index:   31 LVOT Area:     3.14 cm  RIGHT VENTRICLE RV S prime:     13.80 cm/s TAPSE (M-mode): 2.6 cm LEFT ATRIUM             Index        RIGHT ATRIUM           Index LA diam:        4.20 cm 1.96 cm/m  RA Area:     24.30 cm LA Vol (A2C):   79.4 ml 37.12 ml/m RA Volume:   81.40 ml  38.06 ml/m LA Vol (A4C):   93.6 ml 43.76 ml/m LA Biplane Vol: 86.2 ml 40.30 ml/m  AORTIC VALVE LVOT Vmax:   102.00 cm/s LVOT Vmean:  62.500 cm/s LVOT VTI:    0.208 m  AORTA Ao Root diam: 3.60 cm MITRAL VALVE                TRICUSPID VALVE MV Area (PHT): 4.15 cm     TR Peak grad:   17.6 mmHg MV Decel Time: 183 msec     TR Vmax:        210.00 cm/s MV E velocity: 111.00 cm/s MV A velocity: 79.70 cm/s   SHUNTS MV E/A ratio:  1.39         Systemic VTI:  0.21 m                             Systemic Diam: 2.00 cm Eleonore Chiquito MD Electronically signed by Eleonore Chiquito MD Signature Date/Time: 04/10/2021/1:25:55 PM    Final     Orson Eva, DO  [facility]  If 7PM-7AM, please contact night-coverage [email or web address] Password [email or web address] 04/17/2021, 9:04 AM   LOS: 7 days

## 2021-04-17 NOTE — Progress Notes (Signed)
Removed patient from Bipap and placed on [employer]. He is on 60% and 30L. He transferred to the chair from the bed. He has some shortness of breath but recovers. His SPO2 on the finger probe was reading froim 84-86%. I placed an ear probe on him to free up his hands and it immediately read 96%. He is eating and feels comfortable for the time being. RN still at bedside.

## 2021-04-18 DIAGNOSIS — J9622 Acute and chronic respiratory failure with hypercapnia: Secondary | ICD-10-CM | POA: Diagnosis not present

## 2021-04-18 DIAGNOSIS — N179 Acute kidney failure, unspecified: Secondary | ICD-10-CM | POA: Diagnosis not present

## 2021-04-18 DIAGNOSIS — I5033 Acute on chronic diastolic (congestive) heart failure: Secondary | ICD-10-CM | POA: Diagnosis not present

## 2021-04-18 DIAGNOSIS — J9621 Acute and chronic respiratory failure with hypoxia: Secondary | ICD-10-CM | POA: Diagnosis not present

## 2021-04-18 LAB — CBC
HCT: 26 % — ABNORMAL LOW (ref 39.0–52.0)
Hemoglobin: 8.4 g/dL — ABNORMAL LOW (ref 13.0–17.0)
MCH: 30.5 pg (ref 26.0–34.0)
MCHC: 32.3 g/dL (ref 30.0–36.0)
MCV: 94.5 fL (ref 80.0–100.0)
Platelets: 251 10*3/uL (ref 150–400)
RBC: 2.75 MIL/uL — ABNORMAL LOW (ref 4.22–5.81)
RDW: 13.5 % (ref 11.5–15.5)
WBC: 9 10*3/uL (ref 4.0–10.5)
nRBC: 0 % (ref 0.0–0.2)

## 2021-04-18 LAB — GLUCOSE, CAPILLARY
Glucose-Capillary: 231 mg/dL — ABNORMAL HIGH (ref 70–99)
Glucose-Capillary: 166 mg/dL — ABNORMAL HIGH (ref 70–99)
Glucose-Capillary: 195 mg/dL — ABNORMAL HIGH (ref 70–99)
Glucose-Capillary: 131 mg/dL — ABNORMAL HIGH (ref 70–99)
Glucose-Capillary: 131 mg/dL — ABNORMAL HIGH (ref 70–99)

## 2021-04-18 LAB — COMPREHENSIVE METABOLIC PANEL
ALT: 17 U/L (ref 0–44)
AST: 11 U/L — ABNORMAL LOW (ref 15–41)
Albumin: 2.2 g/dL — ABNORMAL LOW (ref 3.5–5.0)
Alkaline Phosphatase: 75 U/L (ref 38–126)
Anion gap: 9 (ref 5–15)
BUN: 111 mg/dL — ABNORMAL HIGH (ref 6–20)
CO2: 24 mmol/L (ref 22–32)
Calcium: 7.2 mg/dL — ABNORMAL LOW (ref 8.9–10.3)
Chloride: 104 mmol/L (ref 98–111)
Creatinine, Ser: 3.04 mg/dL — ABNORMAL HIGH (ref 0.61–1.24)
GFR, Estimated: 23 mL/min — ABNORMAL LOW (ref >60)
Glucose, Bld: 124 mg/dL — ABNORMAL HIGH (ref 70–99)
Potassium: 3.6 mmol/L (ref 3.5–5.1)
Sodium: 137 mmol/L (ref 135–145)
Total Bilirubin: 0.4 mg/dL (ref 0.3–1.2)
Total Protein: 6 g/dL — ABNORMAL LOW (ref 6.5–8.1)

## 2021-04-18 NOTE — Progress Notes (Signed)
PROGRESS NOTE  Keith Snow OXB:[id number] DOB: 20-Jul-1961 DOA: 04/09/2021 PCP: System, Provider Not In  Brief History:  60 year old with history of DM2, HTN, MI, CHF comes to the ED with change in mental status.  Upon admission he was noted to have focal consolidation in the right lower lung, elevated BNP.  He was also hyperglycemic.  CT of the chest showed combination of severe bilateral bronchopneumonia and possible effusion.  It has been difficult to diurese him due to worsening renal function.  Nephrology and Pulm consulted for their input. Started on Broad Spectrum Abx and Steroids. Cr continues to increase slowly.    Assessment/Plan: Acute hypoxic respiratory failure requiring BiPAP and HiFlow - Multifactorial.  Combination of PNA, COPD and CHF exacerbation. -9/19 CT chest Severe b/l BronchoPNA with small b/l parapneumonic effusions.    Community-acquired pneumonia. Aspiration PNA?  Concerns for BOOP - Pro-Cal 0.54>>0.34  -Cleared by speech and swallow for regular diet. - Currently on IV Zosyn 7/20>>  Pulmonary suggested Solu-Medrol 80 mg every 12>>40mg   q 12 - Bronchodilators scheduled and as needed.  I-S/flutter -Speech and swallow eval-regular diet -MRSA neg   Diabetes mellitus type 2, uncontrolled with hyperglycemia - 9/17 A1c-10.3 - Continue long-acting insulin 42 units daily.  NovoLog 5 units Premeal - Sliding scale and Accu-Chek   Acute on chronic congestive heart failure with preserved EF, grade 2 DD, class III -04/10/21 Echo 55-60%, grade 2 DD, moderate LVH - Lasix IV by nephrology.   -previously on Entresto, spironolactone which are held due to AKI   Acute kidney injury on CKD stage IIIa - Admission creatinine 1.8, 2.63 > 2.71 > 3.17 >3.49 >3.84>>3.04 -Currently producing about 1.5 L of urine over last 24 hours.   -BUN rising as well but this could also be secondary to steroids.  Potassium and bicarb are relatively stable.   -Renal ultrasound is negative  for acute pathology.   -appreciate renal follow up -he does not look to be florridly fluid overloaded -previously on Bumex 2 mg bid when he lived in [place]   Acute metabolic encephalopathy -Resolved.    Essential hypertension -Coreg,  hydralazine, clonidine -nifedipine stopped due to concerns for shunt  Hyperlipidemia - Crestor             Status is: Inpatient   Remains inpatient appropriate because:Inpatient level of care appropriate due to severity of illness   Dispo: The patient is from: Home              Anticipated d/c is to: Home              Patient currently is not medically stable to d/c.              Difficult to place patient No               Family Communication:   spouse and mother updated 9/25   Consultants:  pulm   Code Status:  FULL    DVT Prophylaxis:  [place] Heparin     Procedures: As Listed in Progress Note Above   Antibiotics: Zosyn 9/20>>          Subjective: He continues to have sob but is breathing a little better today.  Denies f/c, cp, n/v/d, abd pain, dysuria.  no hemoptysis.  Objective: Vitals:   04/18/21 0749 04/18/21 0755 04/18/21 1000 04/18/21 1105  BP:   (!) 172/89   Pulse:   67 65  Resp: Marland Kitchen)  25  16 16   Temp: (!) 96.9 F (36.1 C)   97.7 F (36.5 C)  TempSrc: Axillary   Oral  SpO2:  97% 94% 95%  Weight:      Height:        Intake/Output Summary (Last 24 hours) at 04/18/2021 1526 Last data filed at 04/18/2021 0754 Gross per 24 hour  Intake 50 ml  Output 1760 ml  Net -1710 ml   Weight change: 0.7 kg Exam:  General:  Pt is alert, follows commands appropriately, not in acute distress HEENT: No icterus, No thrush, No neck mass, [place]/AT Cardiovascular: RRR, S1/S2, no rubs, no gallops Respiratory: bibasilar rales. No wheeze Abdomen: Soft/+BS, non tender, non distended, no guarding Extremities: 1 + LE edema, No lymphangitis, No petechiae, No rashes, no synovitis   Data Reviewed: I have personally reviewed following  labs and imaging studies Basic Metabolic Panel: Recent Labs  Lab 04/13/21 0647 04/14/21 0435 04/15/21 0459 04/16/21 0439 04/17/21 0649 04/18/21 0403  NA 138 134* 134* 133* 135 137  K 3.6 4.3 4.2 4.0 3.7 3.6  CL 108 102 102 100 103 104  CO2 19* 21* 19* 19* 20* 24  GLUCOSE 191* 350* 245* 191* 237* 124*  BUN 60* 78* 97* 114* 126* 111*  CREATININE 2.71* 3.17* 3.49* 3.84* 3.29* 3.04*  CALCIUM 7.3* 7.7* 7.6* 7.3* 7.4* 7.2*  MG 2.3 2.6* 2.8* 2.8* 2.9*  --   PHOS  --   --   --  5.6*  --   --    Liver Function Tests: Recent Labs  Lab 04/13/21 0647 04/14/21 0435 04/15/21 0459 04/16/21 0439 04/18/21 0403  AST 13* 23 29  --  11*  ALT 13 17 24   --  17  ALKPHOS 59 100 129*  --  75  BILITOT 1.0 0.7 0.7  --  0.4  PROT 6.4* 6.7 6.6  --  6.0*  ALBUMIN 2.6* 2.5* 2.5* 2.5* 2.2*   No results for input(s): LIPASE, AMYLASE in the last 168 hours. No results for input(s): AMMONIA in the last 168 hours. Coagulation Profile: No results for input(s): INR, PROTIME in the last 168 hours. CBC: Recent Labs  Lab 04/14/21 0435 04/15/21 0459 04/16/21 0439 04/17/21 0649 04/18/21 0403  WBC 6.2 6.2 8.6 8.6 9.0  HGB 9.1* 8.9* 8.6* 8.8* 8.4*  HCT 28.2* 27.7* 27.0* 27.5* 26.0*  MCV 96.2 95.8 94.7 94.2 94.5  PLT 182 193 211 254 251   Cardiac Enzymes: No results for input(s): CKTOTAL, CKMB, CKMBINDEX, TROPONINI in the last 168 hours. BNP: Invalid input(s): POCBNP CBG: Recent Labs  Lab 04/17/21 1614 04/17/21 2155 04/18/21 0407 04/18/21 0753 04/18/21 1103  GLUCAP 232* 119* 231* 166* 195*   HbA1C: No results for input(s): HGBA1C in the last 72 hours. Urine analysis:    Component Value Date/Time   COLORURINE YELLOW 04/14/2021 1803   APPEARANCEUR CLEAR 04/14/2021 1803   LABSPEC >1.030 (H) 04/14/2021 1803   PHURINE 5.0 04/14/2021 1803   GLUCOSEU NEGATIVE 04/14/2021 1803   HGBUR NEGATIVE 04/14/2021 1803   BILIRUBINUR NEGATIVE 04/14/2021 1803   KETONESUR NEGATIVE 04/14/2021 1803    PROTEINUR TRACE (A) 04/14/2021 1803   NITRITE NEGATIVE 04/14/2021 1803   LEUKOCYTESUR NEGATIVE 04/14/2021 1803   Sepsis Labs: @LABRCNTIP (procalcitonin:4,lacticidven:4) ) Recent Results (from the past 240 hour(s))  Urine Culture     Status: Abnormal   Collection Time: 04/09/21 12:07 AM   Specimen: Urine, Clean Catch  Result Value Ref Range Status   Specimen Description   Final  URINE, CLEAN CATCH Performed at [facility], [address], [place], [place] [address]    Special Requests   Final    NONE Performed at [facility], [address], [place], [place] [address]    Culture (A)  Final    <10,000 COLONIES/mL INSIGNIFICANT GROWTH Performed at [place] [address] [place], [place] [address]    Report Status 04/11/2021 FINAL  Final  Resp Panel by RT-PCR (Flu A&B, Covid) Nasopharyngeal Swab     Status: None   Collection Time: 04/09/21 12:10 AM   Specimen: Nasopharyngeal Swab; Nasopharyngeal(NP) swabs in vial transport medium  Result Value Ref Range Status   SARS Coronavirus 2 by RT PCR NEGATIVE NEGATIVE Final    Comment: (NOTE) SARS-CoV-2 target nucleic acids are NOT DETECTED.  The SARS-CoV-2 RNA is generally detectable in upper respiratory specimens during the acute phase of infection. The lowest concentration of SARS-CoV-2 viral copies this assay can detect is 138 copies/mL. A negative result does not preclude SARS-Cov-2 infection and should not be used as the sole basis for treatment or other patient management decisions. A negative result may occur with  improper specimen collection/handling, submission of specimen other than nasopharyngeal swab, presence of viral mutation(s) within the areas targeted by this assay, and inadequate number of viral copies(<138 copies/mL). A negative result must be combined with clinical observations, patient history, and epidemiological information. The expected result is Negative.  Fact Sheet for Patients:   EntrepreneurPulse.com.au  Fact Sheet for Healthcare Providers:  IncredibleEmployment.be  This test is no t yet approved or cleared by the [place] FDA and  has been authorized for detection and/or diagnosis of SARS-CoV-2 by FDA under an Emergency Use Authorization (EUA). This EUA will remain  in effect (meaning this test can be used) for the duration of the COVID-19 declaration under Section 564(b)(1) of the Act, 21 U.S.C.section 360bbb-3(b)(1), unless the authorization is terminated  or revoked sooner.       Influenza A by PCR NEGATIVE NEGATIVE Final   Influenza B by PCR NEGATIVE NEGATIVE Final    Comment: (NOTE) The Xpert Xpress SARS-CoV-2/FLU/RSV plus assay is intended as an aid in the diagnosis of influenza from Nasopharyngeal swab specimens and should not be used as a sole basis for treatment. Nasal washings and aspirates are unacceptable for Xpert Xpress SARS-CoV-2/FLU/RSV testing.  Fact Sheet for Patients: EntrepreneurPulse.com.au  Fact Sheet for Healthcare Providers: IncredibleEmployment.be  This test is not yet approved or cleared by the [place] FDA and has been authorized for detection and/or diagnosis of SARS-CoV-2 by FDA under an Emergency Use Authorization (EUA). This EUA will remain in effect (meaning this test can be used) for the duration of the COVID-19 declaration under Section 564(b)(1) of the Act, 21 U.S.C. section 360bbb-3(b)(1), unless the authorization is terminated or revoked.  Performed at [facility], [address] [place], [place] [address]   Culture, blood (routine x 2)     Status: None   Collection Time: 04/10/21 12:09 AM   Specimen: Right Antecubital; Blood  Result Value Ref Range Status   Specimen Description RIGHT ANTECUBITAL  Final   Special Requests   Final    BOTTLES DRAWN AEROBIC AND ANAEROBIC Blood Culture results may not be optimal due to an  excessive volume of blood received in culture bottles   Culture   Final    NO GROWTH 5 DAYS Performed at [facility], [address] [place], [place] [address]    Report Status 04/15/2021 FINAL  Final  Culture, blood (routine x 2)     Status: None   Collection Time: 04/10/21 12:09 AM   Specimen: Left Antecubital; Blood  Result Value Ref Range Status   Specimen Description LEFT ANTECUBITAL  Final   Special Requests   Final    BOTTLES DRAWN AEROBIC AND ANAEROBIC Blood Culture results may not be optimal due to an excessive volume of blood received in culture bottles   Culture   Final    NO GROWTH 5 DAYS Performed at [facility], [address], [place], [place] [address]    Report Status 04/15/2021 FINAL  Final  MRSA Next Gen by PCR, Nasal     Status: None   Collection Time: 04/10/21  2:47 PM   Specimen: Nasal Mucosa; Nasal Swab  Result Value Ref Range Status   MRSA by PCR Next Gen NOT DETECTED NOT DETECTED Final    Comment: (NOTE) The GeneXpert MRSA Assay (FDA approved for NASAL specimens only), is one component of a comprehensive MRSA colonization surveillance program. It is not intended to diagnose MRSA infection nor to guide or monitor treatment for MRSA infections. Test performance is not FDA approved in patients less than 67 years old. Performed at [facility], [address], [place], [place] [address]   Expectorated Sputum Assessment w Gram Stain, Rflx to Resp Cult     Status: None   Collection Time: 04/16/21  7:00 PM   Specimen: Sputum  Result Value Ref Range Status   Specimen Description SPUTUM  Final   Special Requests NONE  Final   Sputum evaluation   Final    THIS SPECIMEN IS ACCEPTABLE FOR SPUTUM CULTURE PERFORMED AT [facility] Performed at [facility], [address] [place], [place] [address]    Report Status 04/16/2021 FINAL  Final  Culture, Respiratory w Gram Stain     Status: None (Preliminary result)   Collection Time: 04/16/21  7:00 PM   Specimen: SPU   Result Value Ref Range Status   Specimen Description   Final    SPUTUM Performed at [facility], [address], [place], [place] [address]    Special Requests   Final    NONE Reflexed from [id number] Performed at [facility], [address], [place], [place] [address]    Gram Stain   Final    NO WBC SEEN RARE SQUAMOUS EPITHELIAL CELLS PRESENT RARE GRAM POSITIVE COCCI RARE YEAST    Culture   Final    CULTURE REINCUBATED FOR BETTER GROWTH Performed at [facility], [place] [address], [place], [place] [address]    Report Status PENDING  Incomplete     Scheduled Meds:  aspirin EC  81 mg Oral Daily   budesonide (PULMICORT) nebulizer solution  0.5 mg Nebulization BID   buPROPion  100 mg Oral BID   carvedilol  25 mg Oral BID WC   Chlorhexidine Gluconate Cloth  6 each Topical Daily   cloNIDine  0.3 mg Oral TID   ferrous sulfate  325 mg Oral Q breakfast   furosemide  80 mg Intravenous Daily   heparin  5,000 Units Subcutaneous Q8H   hydrALAZINE  100 mg Oral Q8H   insulin aspart  0-15 Units Subcutaneous TID WC   insulin aspart  0-5 Units Subcutaneous QHS   insulin aspart  5 Units Subcutaneous TID WC   insulin glargine-yfgn  42 Units Subcutaneous Daily   ipratropium-albuterol  3 mL Nebulization Q6H   mouth rinse  15 mL Mouth Rinse BID  methylPREDNISolone (SOLU-MEDROL) injection  40 mg Intravenous Q12H   pantoprazole  40 mg Oral Daily   rosuvastatin  20 mg Oral Daily   Continuous Infusions:  piperacillin-tazobactam (ZOSYN)  IV 3.375 g (04/18/21 1335)    Procedures/Studies: CT CHEST WO CONTRAST  Result Date: 04/12/2021 CLINICAL DATA:  60 year old male with history of acute hypoxic respiratory failure requiring BiPAP. EXAM: CT CHEST WITHOUT CONTRAST TECHNIQUE: Multidetector CT imaging of the chest was performed following the standard protocol without IV contrast. COMPARISON:  No priors. FINDINGS: Cardiovascular: Heart size is normal. There is no significant pericardial fluid,  thickening or pericardial calcification. There is aortic atherosclerosis, as well as atherosclerosis of the great vessels of the mediastinum and the coronary arteries, including calcified atherosclerotic plaque in the left anterior descending and left circumflex coronary arteries. Mediastinum/Nodes: Multiple prominent borderline enlarged mediastinal and bilateral hilar lymph nodes are noted, nonspecific and likely reactive. Esophagus is unremarkable in appearance. No axillary lymphadenopathy. Lungs/Pleura: Small bilateral pleural effusions (right greater than left) lying dependently. Patchy multifocal airspace consolidation most evident in a peribronchovascular distribution with surrounding ground-glass attenuation and septal thickening. Upper Abdomen: Unremarkable. Musculoskeletal: There are no aggressive appearing lytic or blastic lesions noted in the visualized portions of the skeleton. Multiple old healed rib fractures. IMPRESSION: 1. Severe multilobar bilateral bronchopneumonia with small bilateral parapneumonic pleural effusions lying dependently, as above. 2. Aortic atherosclerosis, in addition to 2 vessel coronary artery disease. Please note that although the presence of coronary artery calcium documents the presence of coronary artery disease, the severity of this disease and any potential stenosis cannot be assessed on this non-gated CT examination. Assessment for potential risk factor modification, dietary therapy or pharmacologic therapy may be warranted, if clinically indicated. Aortic Atherosclerosis (ICD10-I70.0). Electronically Signed   By: Vinnie Langton M.D.   On: 04/12/2021 18:58   US RENAL  Result Date: 04/14/2021 CLINICAL DATA:  Acute renal injury EXAM: RENAL / URINARY TRACT ULTRASOUND COMPLETE COMPARISON:  None. FINDINGS: Right Kidney: Renal measurements: 12.3 x 4.5 x 5.7 cm. = volume: 165 mL. Echogenicity within normal limits. No mass or hydronephrosis visualized. Left Kidney: Renal  measurements: 10.9 x 6.0 x 4.9 cm. = volume: 66 mL. Echogenicity within normal limits. No mass or hydronephrosis visualized. Bladder: Bladder is well distended. Mild wall thickening is noted anteriorly measuring up to 9 mm. This could be related to underlying UTI. Clinical correlation is recommended. Other: None. IMPRESSION: Normal-appearing kidneys bilaterally. Mild bladder wall thickening which may be inflammatory in nature. Clinical correlation is recommended. Electronically Signed   By: Inez Catalina M.D.   On: 04/14/2021 15:54   DG Chest Port 1 View  Result Date: 04/14/2021 CLINICAL DATA:  Shortness of breath, history of stroke, diabetes mellitus, hypertension, MI, cancer EXAM: PORTABLE CHEST 1 VIEW COMPARISON:  Portable exam 1340 hours compared to 04/10/2021 FINDINGS: Enlargement of cardiac silhouette with pulmonary vascular congestion. Stable mediastinal contours. BILATERAL pulmonary infiltrates question pulmonary edema, slightly greater at RIGHT base. Small BILATERAL pleural effusions. No pneumothorax or acute osseous findings. IMPRESSION: Enlargement of cardiac silhouette with pulmonary vascular congestion and mild pulmonary edema. Small BILATERAL pleural effusions. Electronically Signed   By: Lavonia Dana M.D.   On: 04/14/2021 16:01   DG CHEST PORT 1 VIEW  Result Date: 04/10/2021 CLINICAL DATA:  Weakness and fatigue. EXAM: PORTABLE CHEST 1 VIEW COMPARISON:  04/09/2021 FINDINGS: 0657 hours. The cardio pericardial silhouette is enlarged. Focal airspace disease again noted right base with some probable atelectasis or infiltrate in the retrocardiac left base.  There is pulmonary vascular congestion without overt pulmonary edema. Superimposed component of interstitial pulmonary edema suspected. IMPRESSION: Asymmetric focal airspace disease right base with some probable atelectasis or infiltrate in the retrocardiac left base. Interstitial edema not excluded. Electronically Signed   By: Misty Stanley M.D.    On: 04/10/2021 07:33   DG Chest Port 1 View  Result Date: 04/09/2021 CLINICAL DATA:  Fever and weakness EXAM: PORTABLE CHEST 1 VIEW COMPARISON:  None. FINDINGS: The heart and mediastinal contours are within normal limits. Aortic calcification. Right lower lung zone focal consolidation. Question retrocardiac opacity. No pulmonary edema. Possible trace left pleural effusion. No right pleural effusion. No pneumothorax. No acute osseous abnormality. IMPRESSION: Right lower lung zone focal consolidation. Question retrocardiac opacity with possible trace left pleural effusion. Findings consistent with infection/inflammation. Followup PA and lateral chest X-ray is recommended in 3-4 weeks following therapy to ensure resolution and exclude underlying malignancy. Electronically Signed   By: Iven Finn M.D.   On: 04/09/2021 23:59   ECHOCARDIOGRAM COMPLETE  Result Date: 04/10/2021    ECHOCARDIOGRAM REPORT   Patient Name:   ROSAIRE Baumgardner Date of Exam: 04/10/2021 Medical Rec #:  [id number]    Height:       69.0 in Accession #:    [id number]   Weight:       217.0 lb Date of Birth:  [birth year]    BSA:          2.139 m Patient Age:    53 years     BP:           142/83 mmHg Patient Gender: M            HR:           85 bpm. Exam Location:  [facility] Procedure: 2D Echo, Cardiac Doppler and Color Doppler Indications:    SBE I33.9  History:        Patient has no prior history of Echocardiogram examinations.                 Previous Myocardial Infarction, Stroke; Risk                 Factors:Hypertension and Diabetes. Hx of cancer. 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  Sonographer:    Alvino Chapel RCS Referring Phys: [place]  1. Left ventricular ejection fraction, by estimation, is 55 to 60%. The left ventricle has normal function. The left ventricle has no regional wall motion abnormalities. There is moderate concentric left ventricular hypertrophy. Left ventricular  diastolic parameters are consistent with Grade II diastolic dysfunction (pseudonormalization).  2. Right ventricular systolic function is normal. The right ventricular size is normal. There is normal pulmonary artery systolic pressure. The estimated right ventricular systolic pressure is 06.3 mmHg.  3. Left atrial size was moderately dilated.  4. Right atrial size was mildly dilated.  5. The mitral valve is grossly normal. No evidence of mitral valve regurgitation. No evidence of mitral stenosis.  6. The aortic valve is tricuspid. Aortic valve regurgitation is not visualized. No aortic stenosis is present.  7. The inferior vena cava is normal in size with greater than 50% respiratory variability, suggesting right atrial pressure of 3 mmHg. FINDINGS  Left Ventricle: Left ventricular ejection fraction, by estimation, is 55 to 60%. The left ventricle has normal function. The left ventricle has no regional wall motion abnormalities.  The left ventricular internal cavity size was normal in size. There is  moderate concentric left ventricular hypertrophy. Left ventricular diastolic parameters are consistent with Grade II diastolic dysfunction (pseudonormalization). Right Ventricle: The right ventricular size is normal. No increase in right ventricular wall thickness. Right ventricular systolic function is normal. There is normal pulmonary artery systolic pressure. The tricuspid regurgitant velocity is 2.10 m/s, and  with an assumed right atrial pressure of 3 mmHg, the estimated right ventricular systolic pressure is 02.5 mmHg. Left Atrium: Left atrial size was moderately dilated. Right Atrium: Right atrial size was mildly dilated. Pericardium: There is no evidence of pericardial effusion. Mitral Valve: The mitral valve is grossly normal. No evidence of mitral valve regurgitation. No evidence of mitral valve stenosis. Tricuspid Valve: The tricuspid valve is grossly normal. Tricuspid valve regurgitation is trivial. No  evidence of tricuspid stenosis. Aortic Valve: The aortic valve is tricuspid. Aortic valve regurgitation is not visualized. No aortic stenosis is present. Pulmonic Valve: The pulmonic valve was grossly normal. Pulmonic valve regurgitation is not visualized. No evidence of pulmonic stenosis. Aorta: The aortic root is normal in size and structure. Venous: The inferior vena cava is normal in size with greater than 50% respiratory variability, suggesting right atrial pressure of 3 mmHg. IAS/Shunts: The atrial septum is grossly normal.  LEFT VENTRICLE PLAX 2D LVIDd:         4.70 cm  Diastology LVIDs:         3.10 cm  LV e' medial:    6.74 cm/s LV PW:         1.50 cm  LV E/e' medial:  16.5 LV IVS:        1.50 cm  LV e' lateral:   9.14 cm/s LVOT diam:     2.00 cm  LV E/e' lateral: 12.1 LV SV:         65 LV SV Index:   31 LVOT Area:     3.14 cm  RIGHT VENTRICLE RV S prime:     13.80 cm/s TAPSE (M-mode): 2.6 cm LEFT ATRIUM             Index       RIGHT ATRIUM           Index LA diam:        4.20 cm 1.96 cm/m  RA Area:     24.30 cm LA Vol (A2C):   79.4 ml 37.12 ml/m RA Volume:   81.40 ml  38.06 ml/m LA Vol (A4C):   93.6 ml 43.76 ml/m LA Biplane Vol: 86.2 ml 40.30 ml/m  AORTIC VALVE LVOT Vmax:   102.00 cm/s LVOT Vmean:  62.500 cm/s LVOT VTI:    0.208 m  AORTA Ao Root diam: 3.60 cm MITRAL VALVE                TRICUSPID VALVE MV Area (PHT): 4.15 cm     TR Peak grad:   17.6 mmHg MV Decel Time: 183 msec     TR Vmax:        210.00 cm/s MV E velocity: 111.00 cm/s MV A velocity: 79.70 cm/s   SHUNTS MV E/A ratio:  1.39         Systemic VTI:  0.21 m                             Systemic Diam: 2.00 cm Eleonore Chiquito MD Electronically signed by Eleonore Chiquito MD Signature Date/Time:  04/10/2021/1:25:55 PM    Final    [place] EKG SITE RITE  Result Date: 04/17/2021 If Site Rite image not attached, placement could not be confirmed due to current cardiac rhythm.   Orson Eva, DO  [facility]  If 7PM-7AM, please contact  night-coverage [email or web address] Password [email or web address] 04/18/2021, 3:26 PM   LOS: 8 days

## 2021-04-18 NOTE — Progress Notes (Signed)
[facility] ROUNDING NOTE   Subjective:   Interval History: Keith Snow is an 60 y.o. male with a PMH significant for longstanding, poorly controlled DM type 2, HTN, chronic Diastolic CHF (DD grade II), h/o covid-19 PNA in 8/20, and CKD stage IIIb (baseline Scr 1.68-2.25) who presented to [facility] ED with weakness, fatigue, and elevated glucose readings >500.  His mother told the ED that he has been having increased sleep, polyuria, and polydipsia for the preceding 24 hrs.  In the ED he was febrile at 100.6, BP 165/89, SpO2 73%, and was noted to be somnolent and in some respiratory distress.  He did have lower extremity edema upon initial examination.  Labs were notable for WBC 10, BUN 54, Cr 2.21, gluc 550, BNP 1141, negative covid test, CXR with focal consolidation in RLL.  He was admitted under sepsis protocol and started on broad spectrum antibiotics, currently on zosyn and prednisone.  We were consulted due to the development of worsening kidney function following IV diuresis.  The trend is seen below.  He was continued on spironolactone and entresto but was held on the 3rd day of admission due to rising SCr.  IV lasix was stopped on 04/12/21.   Blood pressure 172/89 pulse 73 temperature 97.7 O2 sats 95% 6 L nasal cannula  Sodium 137 potassium 3.6 chloride 104 CO2 24 BUN 111 creatinine 3.0 glucose 124 calcium 7.2 albumin 2.2 hemoglobin 8.4  Chest x-ray 04/14/2021 enlarged cardiac silhouette with vascular pulm congestion  2D echo 04/10/2021 showed normal ejection fraction  Urine output 2.4 L 04/17/2021  Medications: Aspirin, Wellbutrin, Coreg, clonidine, iron, Lasix 80 mg IV daily, hydralazine 100 mg every 8 hours, insulin sliding scale, albuterol and DuoNeb inhalers, Solu-Medrol 40 mg every 12 hours, Protonix, Crestor, IV Zosyn  Objective:  Vital signs in last 24 hours:  Temp:  [96.9 F (36.1 C)-98.2 F (36.8 C)] 97.7 F (36.5 C) (09/25 1105) Pulse Rate:  [64-137] 65 (09/25  1105) Resp:  [11-25] 16 (09/25 1105) BP: (155-178)/(67-124) 172/89 (09/25 1000) SpO2:  [87 %-100 %] 95 % (09/25 1105) FiO2 (%):  [100 %] 100 % (09/25 0145) Weight:  [107.1 kg] 107.1 kg (09/25 0500)  Weight change: 0.7 kg Filed Weights   04/16/21 0500 04/17/21 0500 04/18/21 0500  Weight: 106.5 kg 106.4 kg 107.1 kg    Intake/Output: I/O last 3 completed shifts: In: 628 [P.O.:775; IV Piggyback:100] Out: 3050 [Urine:3050]   Intake/Output this shift:  Total I/O In: -  Out: 510 [Urine:510]  CVS- RRR faint systolic ejection murmur RS- CTA diminished air entry at bases crackles fine ABD- BS present soft non-distended EXT-1+ lower extremity edema   Basic Metabolic Panel: Recent Labs  Lab 04/13/21 0647 04/14/21 0435 04/15/21 0459 04/16/21 0439 04/17/21 0649 04/18/21 0403  NA 138 134* 134* 133* 135 137  K 3.6 4.3 4.2 4.0 3.7 3.6  CL 108 102 102 100 103 104  CO2 19* 21* 19* 19* 20* 24  GLUCOSE 191* 350* 245* 191* 237* 124*  BUN 60* 78* 97* 114* 126* 111*  CREATININE 2.71* 3.17* 3.49* 3.84* 3.29* 3.04*  CALCIUM 7.3* 7.7* 7.6* 7.3* 7.4* 7.2*  MG 2.3 2.6* 2.8* 2.8* 2.9*  --   PHOS  --   --   --  5.6*  --   --     Liver Function Tests: Recent Labs  Lab 04/13/21 0647 04/14/21 0435 04/15/21 0459 04/16/21 0439 04/18/21 0403  AST 13* 23 29  --  11*  ALT 13 17  24  --  17  ALKPHOS 59 100 129*  --  75  BILITOT 1.0 0.7 0.7  --  0.4  PROT 6.4* 6.7 6.6  --  6.0*  ALBUMIN 2.6* 2.5* 2.5* 2.5* 2.2*   No results for input(s): LIPASE, AMYLASE in the last 168 hours. No results for input(s): AMMONIA in the last 168 hours.  CBC: Recent Labs  Lab 04/14/21 0435 04/15/21 0459 04/16/21 0439 04/17/21 0649 04/18/21 0403  WBC 6.2 6.2 8.6 8.6 9.0  HGB 9.1* 8.9* 8.6* 8.8* 8.4*  HCT 28.2* 27.7* 27.0* 27.5* 26.0*  MCV 96.2 95.8 94.7 94.2 94.5  PLT 182 193 211 254 251    Cardiac Enzymes: No results for input(s): CKTOTAL, CKMB, CKMBINDEX, TROPONINI in the last 168  hours.  BNP: Invalid input(s): POCBNP  CBG: Recent Labs  Lab 04/17/21 1614 04/17/21 2155 04/18/21 0407 04/18/21 0753 04/18/21 1103  GLUCAP 232* 119* 231* 166* 195*    Microbiology: Results for orders placed or performed during the hospital encounter of 04/09/21  Urine Culture     Status: Abnormal   Collection Time: 04/09/21 12:07 AM   Specimen: Urine, Clean Catch  Result Value Ref Range Status   Specimen Description   Final    URINE, CLEAN CATCH Performed at [facility], [address], [place], [place] [address]    Special Requests   Final    NONE Performed at [facility], [address] [place], [place] [address]    Culture (A)  Final    <10,000 COLONIES/mL INSIGNIFICANT GROWTH Performed at [facility], [place] [address] [place], [place] [address]    Report Status 04/11/2021 FINAL  Final  Resp Panel by RT-PCR (Flu A&B, Covid) Nasopharyngeal Swab     Status: None   Collection Time: 04/09/21 12:10 AM   Specimen: Nasopharyngeal Swab; Nasopharyngeal(NP) swabs in vial transport medium  Result Value Ref Range Status   SARS Coronavirus 2 by RT PCR NEGATIVE NEGATIVE Final    Comment: (NOTE) SARS-CoV-2 target nucleic acids are NOT DETECTED.  The SARS-CoV-2 RNA is generally detectable in upper respiratory specimens during the acute phase of infection. The lowest concentration of SARS-CoV-2 viral copies this assay can detect is 138 copies/mL. A negative result does not preclude SARS-Cov-2 infection and should not be used as the sole basis for treatment or other patient management decisions. A negative result may occur with  improper specimen collection/handling, submission of specimen other than nasopharyngeal swab, presence of viral mutation(s) within the areas targeted by this assay, and inadequate number of viral copies(<138 copies/mL). A negative result must be combined with clinical observations, patient history, and epidemiological information. The expected  result is Negative.  Fact Sheet for Patients:  EntrepreneurPulse.com.au  Fact Sheet for Healthcare Providers:  IncredibleEmployment.be  This test is no t yet approved or cleared by the [place] FDA and  has been authorized for detection and/or diagnosis of SARS-CoV-2 by FDA under an Emergency Use Authorization (EUA). This EUA will remain  in effect (meaning this test can be used) for the duration of the COVID-19 declaration under Section 564(b)(1) of the Act, 21 U.S.C.section 360bbb-3(b)(1), unless the authorization is terminated  or revoked sooner.       Influenza A by PCR NEGATIVE NEGATIVE Final   Influenza B by PCR NEGATIVE NEGATIVE Final    Comment: (NOTE) The Xpert Xpress SARS-CoV-2/FLU/RSV plus assay is intended as an aid in the diagnosis of influenza from Nasopharyngeal swab specimens and should not be used as  a sole basis for treatment. Nasal washings and aspirates are unacceptable for Xpert Xpress SARS-CoV-2/FLU/RSV testing.  Fact Sheet for Patients: EntrepreneurPulse.com.au  Fact Sheet for Healthcare Providers: IncredibleEmployment.be  This test is not yet approved or cleared by the [place] FDA and has been authorized for detection and/or diagnosis of SARS-CoV-2 by FDA under an Emergency Use Authorization (EUA). This EUA will remain in effect (meaning this test can be used) for the duration of the COVID-19 declaration under Section 564(b)(1) of the Act, 21 U.S.C. section 360bbb-3(b)(1), unless the authorization is terminated or revoked.  Performed at [facility], [address], [place], [place] [address]   Culture, blood (routine x 2)     Status: None   Collection Time: 04/10/21 12:09 AM   Specimen: Right Antecubital; Blood  Result Value Ref Range Status   Specimen Description RIGHT ANTECUBITAL  Final   Special Requests   Final    BOTTLES DRAWN AEROBIC AND ANAEROBIC Blood  Culture results may not be optimal due to an excessive volume of blood received in culture bottles   Culture   Final    NO GROWTH 5 DAYS Performed at [facility], [address] [place], [place] [address]    Report Status 04/15/2021 FINAL  Final  Culture, blood (routine x 2)     Status: None   Collection Time: 04/10/21 12:09 AM   Specimen: Left Antecubital; Blood  Result Value Ref Range Status   Specimen Description LEFT ANTECUBITAL  Final   Special Requests   Final    BOTTLES DRAWN AEROBIC AND ANAEROBIC Blood Culture results may not be optimal due to an excessive volume of blood received in culture bottles   Culture   Final    NO GROWTH 5 DAYS Performed at [facility], [address], [place], [place] [address]    Report Status 04/15/2021 FINAL  Final  MRSA Next Gen by PCR, Nasal     Status: None   Collection Time: 04/10/21  2:47 PM   Specimen: Nasal Mucosa; Nasal Swab  Result Value Ref Range Status   MRSA by PCR Next Gen NOT DETECTED NOT DETECTED Final    Comment: (NOTE) The GeneXpert MRSA Assay (FDA approved for NASAL specimens only), is one component of a comprehensive MRSA colonization surveillance program. It is not intended to diagnose MRSA infection nor to guide or monitor treatment for MRSA infections. Test performance is not FDA approved in patients less than 38 years old. Performed at [facility], [address], [place], [place] [address]   Expectorated Sputum Assessment w Gram Stain, Rflx to Resp Cult     Status: None   Collection Time: 04/16/21  7:00 PM   Specimen: Sputum  Result Value Ref Range Status   Specimen Description SPUTUM  Final   Special Requests NONE  Final   Sputum evaluation   Final    THIS SPECIMEN IS ACCEPTABLE FOR SPUTUM CULTURE PERFORMED AT [facility] Performed at [facility], [address], [place], [place] [address]    Report Status 04/16/2021 FINAL  Final  Culture, Respiratory w Gram Stain     Status: None (Preliminary result)   Collection  Time: 04/16/21  7:00 PM   Specimen: SPU  Result Value Ref Range Status   Specimen Description   Final    SPUTUM Performed at [facility], [address], [place], [place] [address]    Special Requests   Final    NONE Reflexed from [id number] Performed at [facility], [place]  [address], [place], [place] [address]    Gram Stain   Final    NO WBC SEEN RARE SQUAMOUS EPITHELIAL CELLS PRESENT RARE GRAM POSITIVE COCCI RARE YEAST    Culture   Final    CULTURE REINCUBATED FOR BETTER GROWTH Performed at [facility], [place] [address] [place], [place] [address]    Report Status PENDING  Incomplete    Coagulation Studies: No results for input(s): LABPROT, INR in the last 72 hours.  Urinalysis: No results for input(s): COLORURINE, LABSPEC, PHURINE, GLUCOSEU, HGBUR, BILIRUBINUR, KETONESUR, PROTEINUR, UROBILINOGEN, NITRITE, LEUKOCYTESUR in the last 72 hours.  Invalid input(s): APPERANCEUR    Imaging: [place] EKG SITE RITE  Result Date: 04/17/2021 If Site Rite image not attached, placement could not be confirmed due to current cardiac rhythm.    Medications:    piperacillin-tazobactam (ZOSYN)  IV 3.375 g (04/18/21 1335)    aspirin EC  81 mg Oral Daily   budesonide (PULMICORT) nebulizer solution  0.5 mg Nebulization BID   buPROPion  100 mg Oral BID   carvedilol  25 mg Oral BID WC   Chlorhexidine Gluconate Cloth  6 each Topical Daily   cloNIDine  0.3 mg Oral TID   ferrous sulfate  325 mg Oral Q breakfast   furosemide  80 mg Intravenous Daily   heparin  5,000 Units Subcutaneous Q8H   hydrALAZINE  100 mg Oral Q8H   insulin aspart  0-15 Units Subcutaneous TID WC   insulin aspart  0-5 Units Subcutaneous QHS   insulin aspart  5 Units Subcutaneous TID WC   insulin glargine-yfgn  42 Units Subcutaneous Daily   ipratropium-albuterol  3 mL Nebulization Q6H   mouth rinse  15 mL Mouth Rinse BID   methylPREDNISolone (SOLU-MEDROL) injection  40 mg Intravenous Q12H   pantoprazole  40 mg Oral  Daily   rosuvastatin  20 mg Oral Daily   acetaminophen **OR** acetaminophen, ipratropium-albuterol, labetalol, ondansetron **OR** ondansetron (ZOFRAN) IV, oxyCODONE, senna-docusate, traZODone  Assessment/ Plan:   AKI on CKD - presumably hemodynamically mediated in setting of transient hypotension with ongoing IV diuresis and concomitant use of ARB and spironolactone.  Diuretics and ARB on hold now.  Exam not consistent with volume overload.  renal US 12.3 and 10.9 cm kidneys no hydro and normal echogenicity. Bladder scan 70 mL.  UA trace protein and 0-5 RBC, high specific gravity. ANA was sent per team and neg No emergent indication for dialysis at this time but may be approaching need if no improvement.  His BUN continues to rise but 2/2 steroids as well  Lasix 80 mg IV once now and daily for now Would reduce zosyn to 2.25 gram every 8 hours per primary team discretion  Changed to renal diet CKD stage 3 b - note baseline Cr 1.7- 2.1 Acute hypoxic respiratory failure due to bilateral bronchopneumonia -  his SOB is most likely related to his pneumonia.  preserved EF.  pulm has seen.  Chronic diastolic CHF - Cardiology following.  Agree difficult to assess given history of chronically elevated BNP and now with bilaterally.  Lasix as above Poorly controlled DM with Hyperglycemia - due to acute infection and steroids, per primary.  Historically Hgb A1c has been >10% AMS - per primary team is improved HTN - off of entresto, bumex, and spironolactone from his home meds (according to 03/22/21 ov with his cardiology at the [facility]).  Avoid hypotension DM - with hyperglycemia - control per primary team  Anemia - normocytic -  with component of iron deficiency. See started on oral iron     LOS: [place] @TODAY @2 :07 PM

## 2021-04-18 NOTE — Progress Notes (Signed)
[facility] called for IV insertion.  2 peripheral IV inserted. 20g in R upper arm and 20g in L upper arm

## 2021-04-19 DIAGNOSIS — N179 Acute kidney failure, unspecified: Secondary | ICD-10-CM

## 2021-04-19 DIAGNOSIS — J9622 Acute and chronic respiratory failure with hypercapnia: Secondary | ICD-10-CM | POA: Diagnosis not present

## 2021-04-19 DIAGNOSIS — N1832 Chronic kidney disease, stage 3b: Secondary | ICD-10-CM

## 2021-04-19 DIAGNOSIS — J9601 Acute respiratory failure with hypoxia: Secondary | ICD-10-CM | POA: Diagnosis not present

## 2021-04-19 DIAGNOSIS — I5033 Acute on chronic diastolic (congestive) heart failure: Secondary | ICD-10-CM | POA: Diagnosis not present

## 2021-04-19 DIAGNOSIS — J9621 Acute and chronic respiratory failure with hypoxia: Secondary | ICD-10-CM | POA: Diagnosis not present

## 2021-04-19 LAB — HEPATIC FUNCTION PANEL
ALT: 16 U/L (ref 0–44)
AST: 11 U/L — ABNORMAL LOW (ref 15–41)
Albumin: 2.3 g/dL — ABNORMAL LOW (ref 3.5–5.0)
Alkaline Phosphatase: 65 U/L (ref 38–126)
Bilirubin, Direct: 0.1 mg/dL (ref 0.0–0.2)
Indirect Bilirubin: 0.6 mg/dL (ref 0.3–0.9)
Total Bilirubin: 0.7 mg/dL (ref 0.3–1.2)
Total Protein: 6.2 g/dL — ABNORMAL LOW (ref 6.5–8.1)

## 2021-04-19 LAB — CBC
HCT: 27.5 % — ABNORMAL LOW (ref 39.0–52.0)
Hemoglobin: 8.8 g/dL — ABNORMAL LOW (ref 13.0–17.0)
MCH: 29.9 pg (ref 26.0–34.0)
MCHC: 32 g/dL (ref 30.0–36.0)
MCV: 93.5 fL (ref 80.0–100.0)
Platelets: 267 10*3/uL (ref 150–400)
RBC: 2.94 MIL/uL — ABNORMAL LOW (ref 4.22–5.81)
RDW: 13.7 % (ref 11.5–15.5)
WBC: 9.4 10*3/uL (ref 4.0–10.5)
nRBC: 0 % (ref 0.0–0.2)

## 2021-04-19 LAB — BASIC METABOLIC PANEL
Anion gap: 9 (ref 5–15)
BUN: 112 mg/dL — ABNORMAL HIGH (ref 6–20)
CO2: 22 mmol/L (ref 22–32)
Calcium: 7.3 mg/dL — ABNORMAL LOW (ref 8.9–10.3)
Chloride: 104 mmol/L (ref 98–111)
Creatinine, Ser: 3.09 mg/dL — ABNORMAL HIGH (ref 0.61–1.24)
GFR, Estimated: 23 mL/min — ABNORMAL LOW (ref >60)
Glucose, Bld: 206 mg/dL — ABNORMAL HIGH (ref 70–99)
Potassium: 3.7 mmol/L (ref 3.5–5.1)
Sodium: 135 mmol/L (ref 135–145)

## 2021-04-19 LAB — GLUCOSE, CAPILLARY
Glucose-Capillary: 231 mg/dL — ABNORMAL HIGH (ref 70–99)
Glucose-Capillary: 272 mg/dL — ABNORMAL HIGH (ref 70–99)
Glucose-Capillary: 206 mg/dL — ABNORMAL HIGH (ref 70–99)
Glucose-Capillary: 118 mg/dL — ABNORMAL HIGH (ref 70–99)

## 2021-04-19 LAB — CULTURE, RESPIRATORY W GRAM STAIN
Culture: NORMAL
Gram Stain: NONE SEEN

## 2021-04-19 MED ORDER — AMLODIPINE BESYLATE 5 MG PO TABS
5.0000 mg | ORAL_TABLET | Freq: Every day | ORAL | Status: DC
  Administered 2021-04-19 – 2021-04-21 (×3): 5 mg via ORAL
  Filled 2021-04-19 (×3): qty 1

## 2021-04-19 MED ORDER — BUMETANIDE 1 MG PO TABS
1.0000 mg | ORAL_TABLET | Freq: Two times a day (BID) | ORAL | Status: DC
  Administered 2021-04-19 – 2021-04-21 (×5): 1 mg via ORAL
  Filled 2021-04-19 (×5): qty 1

## 2021-04-19 MED ORDER — METHYLPREDNISOLONE SODIUM SUCC 40 MG IJ SOLR: 40.0000 mg | Freq: Every day | INTRAMUSCULAR | Status: DC

## 2021-04-19 MED ORDER — PREDNISONE 20 MG PO TABS
40.0000 mg | ORAL_TABLET | Freq: Every day | ORAL | Status: DC
  Administered 2021-04-20: 40 mg via ORAL
  Filled 2021-04-19: qty 2

## 2021-04-19 MED ORDER — ISOSORBIDE MONONITRATE ER 30 MG PO TB24
30.0000 mg | ORAL_TABLET | Freq: Every day | ORAL | Status: DC
  Administered 2021-04-19 – 2021-04-21 (×3): 30 mg via ORAL
  Filled 2021-04-19 (×3): qty 1

## 2021-04-19 MED ORDER — INSULIN GLARGINE-YFGN 100 UNIT/ML ~~LOC~~ SOLN
46.0000 [IU] | Freq: Every day | SUBCUTANEOUS | Status: DC
  Filled 2021-04-19 (×2): qty 0.46
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
## 2021-04-19 NOTE — Progress Notes (Signed)
PROGRESS NOTE  Keith Snow ZTI:[id number] DOB: 1960/11/04 DOA: 04/09/2021 PCP: Keith Snow, Provider Not In   Brief History:  60 year old with history of DM2, HTN, MI, CHF comes to the ED with change in mental status.  Upon admission he was noted to have focal consolidation in the right lower lung, elevated BNP.  He was also hyperglycemic.  CT of the chest showed combination of severe bilateral bronchopneumonia and possible effusion.  It has been difficult to diurese him due to worsening renal function.  Nephrology and Pulm consulted for their input. Started on [employer] Spectrum Abx and Steroids. Cr continues to increase slowly.    Assessment/Plan: Acute hypoxic respiratory failure requiring BiPAP and HiFlow - Multifactorial.  Combination of PNA, COPD and CHF exacerbation. -9/19 CT chest Severe b/l BronchoPNA with small b/l parapneumonic effusions.    Community-acquired pneumonia. Aspiration PNA?  Concerns for BOOP - Pro-Cal 0.54>>0.34  -Cleared by speech and swallow for regular diet. - Currently on IV Zosyn 7/20>>  Pulmonary suggested Solu-Medrol 80 mg every 12>>40mg   q 12 - Bronchodilators scheduled and as needed.  I-S/flutter -Speech and swallow eval-regular diet -MRSA neg -9/26 discussed with Pulm, Dr. Elsworth Soho   Diabetes mellitus type 2, uncontrolled with hyperglycemia - 9/17 A1c-10.3 - Increase Semglee to 46 units daily.  NovoLog 5 units Premeal - Sliding scale and Accu-Chek   Acute on chronic congestive heart failure with preserved EF, grade 2 DD, class III -04/10/21 Echo 55-60%, grade 2 DD, moderate LVH - Lasix IV by nephrology>>transition to 1/2 home dose bumex -9/26 discussed with renal--Dr. Joelyn Oms -previously on Entresto, spironolactone which are held due to AKI   Acute kidney injury on CKD stage IIIa - Admission creatinine 1.8, 2.63 > 2.71 > 3.17 >3.49 >3.84>>3.04 -Currently producing about 1.5 L of urine over last 24 hours.   -BUN rising as well but this could also  be secondary to steroids.  Potassium and bicarb are relatively stable.   -Renal ultrasound is negative for acute pathology.   -appreciate renal follow up -he does not look to be florridly fluid overloaded -previously on Bumex 2 mg bid when he lived in [place]   Acute metabolic encephalopathy -Resolved.    Essential hypertension -Coreg,  hydralazine, clonidine -nifedipine stopped due to concerns for shunt -add imdur and amlodipine  Hyperlipidemia - Crestor             Status is: Inpatient   Remains inpatient appropriate because:Inpatient level of care appropriate due to severity of illness   Dispo: The patient is from: Home              Anticipated d/c is to: Home              Patient currently is not medically stable to d/c.              Difficult to place patient No               Family Communication:   spouse updated 9/26   Consultants:  pulm   Code Status:  FULL    DVT Prophylaxis:  [place] Heparin     Procedures: As Listed in Progress Note Above   Antibiotics: Zosyn 9/20>>     Subjective: Patient denies fevers, chills, headache, chest pain, dyspnea, nausea, vomiting, diarrhea, abdominal pain, dysuria, hematuria, hematochezia, and melena.   Objective: Vitals:   04/19/21 0932 04/19/21 1000 04/19/21 1100 04/19/21 1241  BP: (!) 180/81 (!) 151/78 Marland Kitchen)  157/68 130/69  Pulse:  61 61 65  Resp:  14 14 14   Temp:   97.7 F (36.5 C)   TempSrc:   Oral   SpO2:  99% 96% 96%  Weight:      Height:        Intake/Output Summary (Last 24 hours) at 04/19/2021 1254 Last data filed at 04/19/2021 1237 Gross per 24 hour  Intake 244.55 ml  Output 2000 ml  Net -1755.45 ml   Weight change:  Exam:  General:  Pt is alert, follows commands appropriately, not in acute distress HEENT: No icterus, No thrush, No neck mass, [place]/AT Cardiovascular: RRR, S1/S2, no rubs, no gallops Respiratory: bibasilar rales. No wheeze Abdomen: Soft/+BS, non tender, non distended, no  guarding Extremities: trace LE edema, No lymphangitis, No petechiae, No rashes, no synovitis   Data Reviewed: I have personally reviewed following labs and imaging studies Basic Metabolic Panel: Recent Labs  Lab 04/13/21 0647 04/14/21 0435 04/15/21 0459 04/16/21 0439 04/17/21 0649 04/18/21 0403 04/19/21 0405  NA 138 134* 134* 133* 135 137 135  K 3.6 4.3 4.2 4.0 3.7 3.6 3.7  CL 108 102 102 100 103 104 104  CO2 19* 21* 19* 19* 20* 24 22  GLUCOSE 191* 350* 245* 191* 237* 124* 206*  BUN 60* 78* 97* 114* 126* 111* 112*  CREATININE 2.71* 3.17* 3.49* 3.84* 3.29* 3.04* 3.09*  CALCIUM 7.3* 7.7* 7.6* 7.3* 7.4* 7.2* 7.3*  MG 2.3 2.6* 2.8* 2.8* 2.9*  --   --   PHOS  --   --   --  5.6*  --   --   --    Liver Function Tests: Recent Labs  Lab 04/13/21 0647 04/14/21 0435 04/15/21 0459 04/16/21 0439 04/18/21 0403 04/19/21 0405  AST 13* 23 29  --  11* 11*  ALT 13 17 24   --  17 16  ALKPHOS 59 100 129*  --  75 65  BILITOT 1.0 0.7 0.7  --  0.4 0.7  PROT 6.4* 6.7 6.6  --  6.0* 6.2*  ALBUMIN 2.6* 2.5* 2.5* 2.5* 2.2* 2.3*   No results for input(s): LIPASE, AMYLASE in the last 168 hours. No results for input(s): AMMONIA in the last 168 hours. Coagulation Profile: No results for input(s): INR, PROTIME in the last 168 hours. CBC: Recent Labs  Lab 04/15/21 0459 04/16/21 0439 04/17/21 0649 04/18/21 0403 04/19/21 0405  WBC 6.2 8.6 8.6 9.0 9.4  HGB 8.9* 8.6* 8.8* 8.4* 8.8*  HCT 27.7* 27.0* 27.5* 26.0* 27.5*  MCV 95.8 94.7 94.2 94.5 93.5  PLT 193 211 254 251 267   Cardiac Enzymes: No results for input(s): CKTOTAL, CKMB, CKMBINDEX, TROPONINI in the last 168 hours. BNP: Invalid input(s): POCBNP CBG: Recent Labs  Lab 04/18/21 1103 04/18/21 1625 04/18/21 2133 04/19/21 0738 04/19/21 1114  GLUCAP 195* 131* 131* 231* 272*   HbA1C: No results for input(s): HGBA1C in the last 72 hours. Urine analysis:    Component Value Date/Time   COLORURINE YELLOW 04/14/2021 1803    APPEARANCEUR CLEAR 04/14/2021 1803   LABSPEC >1.030 (H) 04/14/2021 1803   PHURINE 5.0 04/14/2021 1803   GLUCOSEU NEGATIVE 04/14/2021 1803   HGBUR NEGATIVE 04/14/2021 1803   BILIRUBINUR NEGATIVE 04/14/2021 1803   KETONESUR NEGATIVE 04/14/2021 1803   PROTEINUR TRACE (A) 04/14/2021 1803   NITRITE NEGATIVE 04/14/2021 1803   LEUKOCYTESUR NEGATIVE 04/14/2021 1803   Sepsis Labs: @LABRCNTIP (procalcitonin:4,lacticidven:4) ) Recent Results (from the past 240 hour(s))  Culture, blood (routine x 2)  Status: None   Collection Time: 04/10/21 12:09 AM   Specimen: Right Antecubital; Blood  Result Value Ref Range Status   Specimen Description RIGHT ANTECUBITAL  Final   Special Requests   Final    BOTTLES DRAWN AEROBIC AND ANAEROBIC Blood Culture results may not be optimal due to an excessive volume of blood received in culture bottles   Culture   Final    NO GROWTH 5 DAYS Performed at [facility], [address], [place], [place] [address]    Report Status 04/15/2021 FINAL  Final  Culture, blood (routine x 2)     Status: None   Collection Time: 04/10/21 12:09 AM   Specimen: Left Antecubital; Blood  Result Value Ref Range Status   Specimen Description LEFT ANTECUBITAL  Final   Special Requests   Final    BOTTLES DRAWN AEROBIC AND ANAEROBIC Blood Culture results may not be optimal due to an excessive volume of blood received in culture bottles   Culture   Final    NO GROWTH 5 DAYS Performed at [facility], [address] [place], [place] [address]    Report Status 04/15/2021 FINAL  Final  MRSA Next Gen by PCR, Nasal     Status: None   Collection Time: 04/10/21  2:47 PM   Specimen: Nasal Mucosa; Nasal Swab  Result Value Ref Range Status   MRSA by PCR Next Gen NOT DETECTED NOT DETECTED Final    Comment: (NOTE) The GeneXpert MRSA Assay (FDA approved for NASAL specimens only), is one component of a comprehensive MRSA colonization surveillance program. It is not intended to diagnose MRSA  infection nor to guide or monitor treatment for MRSA infections. Test performance is not FDA approved in patients less than 50 years old. Performed at [facility], [address] [place], [place] [address]   Expectorated Sputum Assessment w Gram Stain, Rflx to Resp Cult     Status: None   Collection Time: 04/16/21  7:00 PM   Specimen: Sputum  Result Value Ref Range Status   Specimen Description SPUTUM  Final   Special Requests NONE  Final   Sputum evaluation   Final    THIS SPECIMEN IS ACCEPTABLE FOR SPUTUM CULTURE PERFORMED AT [facility] Performed at [facility], [address] [place], [place] [address]    Report Status 04/16/2021 FINAL  Final  Culture, Respiratory w Gram Stain     Status: None   Collection Time: 04/16/21  7:00 PM   Specimen: SPU  Result Value Ref Range Status   Specimen Description   Final    SPUTUM Performed at [facility], [address] [place], [place] [address]    Special Requests   Final    NONE Reflexed from [phone] Performed at [facility], [address] [place], [place] [address]    Gram Stain   Final    NO WBC SEEN RARE SQUAMOUS EPITHELIAL CELLS PRESENT RARE GRAM POSITIVE COCCI RARE YEAST    Culture   Final    FEW Normal respiratory flora-no Staph aureus or Pseudomonas seen Performed at [facility], [place] [address] [place] [address], [place], [place] [address]    Report Status 04/19/2021 FINAL  Final     Scheduled Meds:  amLODipine  5 mg Oral Daily   aspirin EC  81 mg Oral Daily   budesonide (PULMICORT) nebulizer solution  0.5 mg Nebulization BID   bumetanide  1 mg Oral BID   buPROPion  100 mg Oral BID   carvedilol  25 mg Oral BID WC   Chlorhexidine Gluconate Cloth  6 each Topical Daily   cloNIDine  0.3 mg Oral TID   ferrous sulfate  325 mg Oral Q breakfast   heparin  5,000 Units Subcutaneous Q8H   hydrALAZINE  100 mg Oral Q8H   insulin aspart  0-15 Units Subcutaneous TID WC   insulin aspart  0-5 Units Subcutaneous QHS   insulin aspart  5 Units  Subcutaneous TID WC   [START ON 04/20/2021] insulin glargine-yfgn  46 Units Subcutaneous Daily   ipratropium-albuterol  3 mL Nebulization Q6H   isosorbide mononitrate  30 mg Oral Daily   mouth rinse  15 mL Mouth Rinse BID   methylPREDNISolone (SOLU-MEDROL) injection  40 mg Intravenous Q12H   pantoprazole  40 mg Oral Daily   rosuvastatin  20 mg Oral Daily   Continuous Infusions:  piperacillin-tazobactam (ZOSYN)  IV 3.375 g (04/19/21 0548)    Procedures/Studies: CT CHEST WO CONTRAST  Result Date: 04/12/2021 CLINICAL DATA:  60 year old male with history of acute hypoxic respiratory failure requiring BiPAP. EXAM: CT CHEST WITHOUT CONTRAST TECHNIQUE: Multidetector CT imaging of the chest was performed following the standard protocol without IV contrast. COMPARISON:  No priors. FINDINGS: Cardiovascular: Heart size is normal. There is no significant pericardial fluid, thickening or pericardial calcification. There is aortic atherosclerosis, as well as atherosclerosis of the great vessels of the mediastinum and the coronary arteries, including calcified atherosclerotic plaque in the left anterior descending and left circumflex coronary arteries. Mediastinum/Nodes: Multiple prominent borderline enlarged mediastinal and bilateral hilar lymph nodes are noted, nonspecific and likely reactive. Esophagus is unremarkable in appearance. No axillary lymphadenopathy. Lungs/Pleura: Small bilateral pleural effusions (right greater than left) lying dependently. Patchy multifocal airspace consolidation most evident in a peribronchovascular distribution with surrounding ground-glass attenuation and septal thickening. Upper Abdomen: Unremarkable. Musculoskeletal: There are no aggressive appearing lytic or blastic lesions noted in the visualized portions of the skeleton. Multiple old healed rib fractures. IMPRESSION: 1. Severe multilobar bilateral bronchopneumonia with small bilateral parapneumonic pleural effusions lying  dependently, as above. 2. Aortic atherosclerosis, in addition to 2 vessel coronary artery disease. Please note that although the presence of coronary artery calcium documents the presence of coronary artery disease, the severity of this disease and any potential stenosis cannot be assessed on this non-gated CT examination. Assessment for potential risk factor modification, dietary therapy or pharmacologic therapy may be warranted, if clinically indicated. Aortic Atherosclerosis (ICD10-I70.0). Electronically Signed   By: Vinnie Langton M.D.   On: 04/12/2021 18:58   US RENAL  Result Date: 04/14/2021 CLINICAL DATA:  Acute renal injury EXAM: RENAL / URINARY TRACT ULTRASOUND COMPLETE COMPARISON:  None. FINDINGS: Right Kidney: Renal measurements: 12.3 x 4.5 x 5.7 cm. = volume: 165 mL. Echogenicity within normal limits. No mass or hydronephrosis visualized. Left Kidney: Renal measurements: 10.9 x 6.0 x 4.9 cm. = volume: 66 mL. Echogenicity within normal limits. No mass or hydronephrosis visualized. Bladder: Bladder is well distended. Mild wall thickening is noted anteriorly measuring up to 9 mm. This could be related to underlying UTI. Clinical correlation is recommended. Other: None. IMPRESSION: Normal-appearing kidneys bilaterally. Mild bladder wall thickening which may be inflammatory in nature. Clinical correlation is recommended. Electronically Signed   By: Inez Catalina M.D.   On: 04/14/2021 15:54   DG Chest Port 1 View  Result Date: 04/14/2021 CLINICAL DATA:  Shortness of breath, history of stroke, diabetes mellitus, hypertension, MI, cancer EXAM: PORTABLE CHEST 1 VIEW COMPARISON:  Portable exam 1340 hours  compared to 04/10/2021 FINDINGS: Enlargement of cardiac silhouette with pulmonary vascular congestion. Stable mediastinal contours. BILATERAL pulmonary infiltrates question pulmonary edema, slightly greater at RIGHT base. Small BILATERAL pleural effusions. No pneumothorax or acute osseous findings.  IMPRESSION: Enlargement of cardiac silhouette with pulmonary vascular congestion and mild pulmonary edema. Small BILATERAL pleural effusions. Electronically Signed   By: Lavonia Dana M.D.   On: 04/14/2021 16:01   DG CHEST PORT 1 VIEW  Result Date: 04/10/2021 CLINICAL DATA:  Weakness and fatigue. EXAM: PORTABLE CHEST 1 VIEW COMPARISON:  04/09/2021 FINDINGS: 0657 hours. The cardio pericardial silhouette is enlarged. Focal airspace disease again noted right base with some probable atelectasis or infiltrate in the retrocardiac left base. There is pulmonary vascular congestion without overt pulmonary edema. Superimposed component of interstitial pulmonary edema suspected. IMPRESSION: Asymmetric focal airspace disease right base with some probable atelectasis or infiltrate in the retrocardiac left base. Interstitial edema not excluded. Electronically Signed   By: Misty Stanley M.D.   On: 04/10/2021 07:33   DG Chest Port 1 View  Result Date: 04/09/2021 CLINICAL DATA:  Fever and weakness EXAM: PORTABLE CHEST 1 VIEW COMPARISON:  None. FINDINGS: The heart and mediastinal contours are within normal limits. Aortic calcification. Right lower lung zone focal consolidation. Question retrocardiac opacity. No pulmonary edema. Possible trace left pleural effusion. No right pleural effusion. No pneumothorax. No acute osseous abnormality. IMPRESSION: Right lower lung zone focal consolidation. Question retrocardiac opacity with possible trace left pleural effusion. Findings consistent with infection/inflammation. Followup PA and lateral chest X-ray is recommended in 3-4 weeks following therapy to ensure resolution and exclude underlying malignancy. Electronically Signed   By: Iven Finn M.D.   On: 04/09/2021 23:59   ECHOCARDIOGRAM COMPLETE  Result Date: 04/10/2021    ECHOCARDIOGRAM REPORT   Patient Name:   Keith Snow Date of Exam: 04/10/2021 Medical Rec #:  [id number]    Height:       69.0 in Accession #:    [id number]    Weight:       217.0 lb Date of Birth:  1961/06/22    BSA:          2.139 m Patient Age:    76 years     BP:           142/83 mmHg Patient Gender: M            HR:           85 bpm. Exam Location:  [facility] Procedure: 2D Echo, Cardiac Doppler and Color Doppler Indications:    SBE I33.9  History:        Patient has no prior history of Echocardiogram examinations.                 Previous Myocardial Infarction, Stroke; Risk                 Factors:Hypertension and Diabetes. Hx of cancer. 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  Sonographer:    Alvino Chapel RCS Referring Phys: [place]  1. Left ventricular ejection fraction, by estimation, is 55 to 60%. The left ventricle has normal function. The left ventricle has no regional wall motion abnormalities. There is moderate concentric left ventricular hypertrophy. Left ventricular diastolic parameters are consistent with Grade II diastolic dysfunction (pseudonormalization).  2. Right ventricular systolic function is normal. The right ventricular size is normal. There is  normal pulmonary artery systolic pressure. The estimated right ventricular systolic pressure is 38.2 mmHg.  3. Left atrial size was moderately dilated.  4. Right atrial size was mildly dilated.  5. The mitral valve is grossly normal. No evidence of mitral valve regurgitation. No evidence of mitral stenosis.  6. The aortic valve is tricuspid. Aortic valve regurgitation is not visualized. No aortic stenosis is present.  7. The inferior vena cava is normal in size with greater than 50% respiratory variability, suggesting right atrial pressure of 3 mmHg. FINDINGS  Left Ventricle: Left ventricular ejection fraction, by estimation, is 55 to 60%. The left ventricle has normal function. The left ventricle has no regional wall motion abnormalities. The left ventricular internal cavity size was normal in size. There is  moderate concentric left ventricular  hypertrophy. Left ventricular diastolic parameters are consistent with Grade II diastolic dysfunction (pseudonormalization). Right Ventricle: The right ventricular size is normal. No increase in right ventricular wall thickness. Right ventricular systolic function is normal. There is normal pulmonary artery systolic pressure. The tricuspid regurgitant velocity is 2.10 m/s, and  with an assumed right atrial pressure of 3 mmHg, the estimated right ventricular systolic pressure is 50.5 mmHg. Left Atrium: Left atrial size was moderately dilated. Right Atrium: Right atrial size was mildly dilated. Pericardium: There is no evidence of pericardial effusion. Mitral Valve: The mitral valve is grossly normal. No evidence of mitral valve regurgitation. No evidence of mitral valve stenosis. Tricuspid Valve: The tricuspid valve is grossly normal. Tricuspid valve regurgitation is trivial. No evidence of tricuspid stenosis. Aortic Valve: The aortic valve is tricuspid. Aortic valve regurgitation is not visualized. No aortic stenosis is present. Pulmonic Valve: The pulmonic valve was grossly normal. Pulmonic valve regurgitation is not visualized. No evidence of pulmonic stenosis. Aorta: The aortic root is normal in size and structure. Venous: The inferior vena cava is normal in size with greater than 50% respiratory variability, suggesting right atrial pressure of 3 mmHg. IAS/Shunts: The atrial septum is grossly normal.  LEFT VENTRICLE PLAX 2D LVIDd:         4.70 cm  Diastology LVIDs:         3.10 cm  LV e' medial:    6.74 cm/s LV PW:         1.50 cm  LV E/e' medial:  16.5 LV IVS:        1.50 cm  LV e' lateral:   9.14 cm/s LVOT diam:     2.00 cm  LV E/e' lateral: 12.1 LV SV:         65 LV SV Index:   31 LVOT Area:     3.14 cm  RIGHT VENTRICLE RV S prime:     13.80 cm/s TAPSE (M-mode): 2.6 cm LEFT ATRIUM             Index       RIGHT ATRIUM           Index LA diam:        4.20 cm 1.96 cm/m  RA Area:     24.30 cm LA Vol (A2C):    79.4 ml 37.12 ml/m RA Volume:   81.40 ml  38.06 ml/m LA Vol (A4C):   93.6 ml 43.76 ml/m LA Biplane Vol: 86.2 ml 40.30 ml/m  AORTIC VALVE LVOT Vmax:   102.00 cm/s LVOT Vmean:  62.500 cm/s LVOT VTI:    0.208 m  AORTA Ao Root diam: 3.60 cm MITRAL VALVE  TRICUSPID VALVE MV Area (PHT): 4.15 cm     TR Peak grad:   17.6 mmHg MV Decel Time: 183 msec     TR Vmax:        210.00 cm/s MV E velocity: 111.00 cm/s MV A velocity: 79.70 cm/s   SHUNTS MV E/A ratio:  1.39         Systemic VTI:  0.21 m                             Systemic Diam: 2.00 cm Eleonore Chiquito MD Electronically signed by Eleonore Chiquito MD Signature Date/Time: 04/10/2021/1:25:55 PM    Final    [place] EKG SITE RITE  Result Date: 04/17/2021 If Site Rite image not attached, placement could not be confirmed due to current cardiac rhythm.   Orson Eva, DO  [facility]  If 7PM-7AM, please contact night-coverage [email or web address] Password [email or web address] 04/19/2021, 12:54 PM   LOS: 9 days

## 2021-04-19 NOTE — Progress Notes (Signed)
Admit: 04/09/2021 LOS: 9  59M AoCKD3, CAP, HFpEF with acute exacerbaiton  Subjective:  GOod UOP, stable SCR, stable azotemia BPs up, added nitrate by Cardiology this AM On IV lasix 80 qAM 6L [place] Steroids per CCM for ?BOOP   09/25 0701 - 09/26 0700 In: 244.6 [P.O.:100; IV Piggyback:144.6] Out: 1710 [Urine:1710]  Filed Weights   04/16/21 0500 04/17/21 0500 04/18/21 0500  Weight: 106.5 kg 106.4 kg 107.1 kg    Scheduled Meds:  aspirin EC  81 mg Oral Daily   budesonide (PULMICORT) nebulizer solution  0.5 mg Nebulization BID   buPROPion  100 mg Oral BID   carvedilol  25 mg Oral BID WC   Chlorhexidine Gluconate Cloth  6 each Topical Daily   cloNIDine  0.3 mg Oral TID   ferrous sulfate  325 mg Oral Q breakfast   furosemide  80 mg Intravenous Daily   heparin  5,000 Units Subcutaneous Q8H   hydrALAZINE  100 mg Oral Q8H   insulin aspart  0-15 Units Subcutaneous TID WC   insulin aspart  0-5 Units Subcutaneous QHS   insulin aspart  5 Units Subcutaneous TID WC   insulin glargine-yfgn  42 Units Subcutaneous Daily   ipratropium-albuterol  3 mL Nebulization Q6H   isosorbide mononitrate  30 mg Oral Daily   mouth rinse  15 mL Mouth Rinse BID   methylPREDNISolone (SOLU-MEDROL) injection  40 mg Intravenous Q12H   pantoprazole  40 mg Oral Daily   rosuvastatin  20 mg Oral Daily   Continuous Infusions:  piperacillin-tazobactam (ZOSYN)  IV 3.375 g (04/19/21 0548)   PRN Meds:.acetaminophen **OR** acetaminophen, ipratropium-albuterol, labetalol, ondansetron **OR** ondansetron (ZOFRAN) IV, oxyCODONE, senna-docusate, traZODone  Current Labs: reviewed    Physical Exam:  Blood pressure (!) 180/91, pulse 63, temperature 97.6 F (36.4 C), temperature source Oral, resp. rate 15, height 5\' 9"  (1.753 m), weight 107.1 kg, SpO2 100 %. NAD, sitting in chair Trace LEE at best RRR nl s1s2 Diminished in bases, clear otherwise NO rashes/lesions AAO x3 Nonfocal EOMI,  NCAT  A AoCKD3, slowly  resolving, nonoliguric, neg imaging at admission; has nephrologist in [place].   Hypoxic RF / CAP, ABX per TRH/PCCM.  Improved, remains on 6L [place] HFpEF, vol status seems stable.   HTN, not at goal Anemia, Hb stable 8s DM2, hyperglycemia, uncontrolled, per TRH HLD  P D/w PCCM and TRH, start amlodipine 5mg /d, titrate as needed Change to PO lasix, resume bumetanide at 50% of home dose Cont to hold RAASi, SGLT2i Daily weights, Daily Renal Panel, Strict I/Os, Avoid nephrotoxins (NSAIDs, judicious IV Contrast)  Will follow along  Pearson Grippe MD 04/19/2021, 8:51 AM  Recent Labs  Lab 04/16/21 0439 04/17/21 0649 04/18/21 0403 04/19/21 0405  NA 133* 135 137 135  K 4.0 3.7 3.6 3.7  CL 100 103 104 104  CO2 19* 20* 24 22  GLUCOSE 191* 237* 124* 206*  BUN 114* 126* 111* 112*  CREATININE 3.84* 3.29* 3.04* 3.09*  CALCIUM 7.3* 7.4* 7.2* 7.3*  PHOS 5.6*  --   --   --    Recent Labs  Lab 04/17/21 0649 04/18/21 0403 04/19/21 0405  WBC 8.6 9.0 9.4  HGB 8.8* 8.4* 8.8*  HCT 27.5* 26.0* 27.5*  MCV 94.2 94.5 93.5  PLT 254 251 267

## 2021-04-19 NOTE — Progress Notes (Signed)
Progress Note  Patient Name: Keith Snow Date of Encounter: 04/19/2021  Primary Cardiologist: Gwen Pounds, MD ([place])  Subjective   Up in chair, no chest pain or breathlessness at rest. No leg swelling.  Inpatient Medications    Scheduled Meds:  aspirin EC  81 mg Oral Daily   budesonide (PULMICORT) nebulizer solution  0.5 mg Nebulization BID   buPROPion  100 mg Oral BID   carvedilol  25 mg Oral BID WC   Chlorhexidine Gluconate Cloth  6 each Topical Daily   cloNIDine  0.3 mg Oral TID   ferrous sulfate  325 mg Oral Q breakfast   furosemide  80 mg Intravenous Daily   heparin  5,000 Units Subcutaneous Q8H   hydrALAZINE  100 mg Oral Q8H   insulin aspart  0-15 Units Subcutaneous TID WC   insulin aspart  0-5 Units Subcutaneous QHS   insulin aspart  5 Units Subcutaneous TID WC   insulin glargine-yfgn  42 Units Subcutaneous Daily   ipratropium-albuterol  3 mL Nebulization Q6H   mouth rinse  15 mL Mouth Rinse BID   methylPREDNISolone (SOLU-MEDROL) injection  40 mg Intravenous Q12H   pantoprazole  40 mg Oral Daily   rosuvastatin  20 mg Oral Daily   Continuous Infusions:  piperacillin-tazobactam (ZOSYN)  IV 3.375 g (04/19/21 0548)   PRN Meds: acetaminophen **OR** acetaminophen, ipratropium-albuterol, labetalol, ondansetron **OR** ondansetron (ZOFRAN) IV, oxyCODONE, senna-docusate, traZODone   Vital Signs    Vitals:   04/19/21 0700 04/19/21 0710 04/19/21 0734 04/19/21 0759  BP:  (!) 180/91 (!) 180/91   Pulse:  63    Resp:  15    Temp: 97.6 F (36.4 C)     TempSrc: Oral     SpO2:  100%  100%  Weight:      Height:        Intake/Output Summary (Last 24 hours) at 04/19/2021 0820 Last data filed at 04/19/2021 0548 Gross per 24 hour  Intake 244.55 ml  Output 1200 ml  Net -955.45 ml   Filed Weights   04/16/21 0500 04/17/21 0500 04/18/21 0500  Weight: 106.5 kg 106.4 kg 107.1 kg    Telemetry    Sinus rhythm.  Personally reviewed.  ECG    An ECG dated  04/10/2021 was personally reviewed today and demonstrated:  Sinus rhythm with left atrial enlargement, nonspecific ST changes.  Physical Exam   GEN: No acute distress.   Neck: No JVD. Cardiac: RRR, no gallop.  Respiratory: Nonlabored.  No crackles, decreased breath sounds at the bases. GI: Soft, nontender, bowel sounds present. MS: No edema; No deformity. Neuro:  Nonfocal. Psych: Alert and oriented x 3. Normal affect.  Labs    Chemistry Recent Labs  Lab 04/15/21 0459 04/16/21 0439 04/17/21 0649 04/18/21 0403 04/19/21 0405  NA 134* 133* 135 137 135  K 4.2 4.0 3.7 3.6 3.7  CL 102 100 103 104 104  CO2 19* 19* 20* 24 22  GLUCOSE 245* 191* 237* 124* 206*  BUN 97* 114* 126* 111* 112*  CREATININE 3.49* 3.84* 3.29* 3.04* 3.09*  CALCIUM 7.6* 7.3* 7.4* 7.2* 7.3*  PROT 6.6  --   --  6.0* 6.2*  ALBUMIN 2.5* 2.5*  --  2.2* 2.3*  AST 29  --   --  11* 11*  ALT 24  --   --  17 16  ALKPHOS 129*  --   --  75 65  BILITOT 0.7  --   --  0.4 0.7  GFRNONAA 19* 17* 21* 23* 23*  ANIONGAP 13 14 12 9 9      Hematology Recent Labs  Lab 04/17/21 0649 04/18/21 0403 04/19/21 0405  WBC 8.6 9.0 9.4  RBC 2.92* 2.75* 2.94*  HGB 8.8* 8.4* 8.8*  HCT 27.5* 26.0* 27.5*  MCV 94.2 94.5 93.5  MCH 30.1 30.5 29.9  MCHC 32.0 32.3 32.0  RDW 13.4 13.5 13.7  PLT 254 251 267    Cardiac Enzymes Recent Labs  Lab 04/10/21 0009 04/10/21 0215  TROPONINIHS 91* 87*    BNP Recent Labs  Lab 04/14/21 0435 04/15/21 0459 04/16/21 0439  BNP 1,360.0* 1,390.0* 1,341.0*     Radiology    [place] EKG SITE RITE  Result Date: 04/17/2021 If Site Rite image not attached, placement could not be confirmed due to current cardiac rhythm.   Cardiac Studies   Echocardiogram 04/10/2021:  1. Left ventricular ejection fraction, by estimation, is 55 to 60%. The  left ventricle has normal function. The left ventricle has no regional  wall motion abnormalities. There is moderate concentric left ventricular  hypertrophy.  Left ventricular  diastolic parameters are consistent with Grade II diastolic dysfunction  (pseudonormalization).   2. Right ventricular systolic function is normal. The right ventricular  size is normal. There is normal pulmonary artery systolic pressure. The  estimated right ventricular systolic pressure is 67.6 mmHg.   3. Left atrial size was moderately dilated.   4. Right atrial size was mildly dilated.   5. The mitral valve is grossly normal. No evidence of mitral valve  regurgitation. No evidence of mitral stenosis.   6. The aortic valve is tricuspid. Aortic valve regurgitation is not  visualized. No aortic stenosis is present.   7. The inferior vena cava is normal in size with greater than 50%  respiratory variability, suggesting right atrial pressure of 3 mmHg.   Assessment & Plan    1.  Chronic diastolic heart failure, LVEF 55 to 60% with moderate LVH, moderate diastolic dysfunction, and normal RV contraction as well as estimated RVSP.  Outside records from his primary cardiologist indicate negative PYP scan for TTR amyloidosis.  He has been managed medically.  Taken off Entresto and spironolactone in light of AKI.  2.  AKI on CKD stage IIIb.  Baseline creatinine 1.7-2.1, currently 3.0.  Nephrology is following.  3.  Uncontrolled type 2 diabetes mellitus.  Hemoglobin A1c 10.3%.  Presently on insulin, had been on Jardiance as an outpatient per records.  4.  Essential hypertension, blood pressure control is not optimal.  5.  Mixed hyperlipidemia, on Crestor.  I reviewed outside records and also interval chart with consultation and follow-up by Dr. Harl Bowie from last week.  Nephrology continues to follow, patient has reasonable urine output on Lasix 80 mg IV daily.  Present cardiac regimen includes Coreg and hydralazine, will add Imdur for hydralazine/nitrate combination.  He is not a candidate for ARB, ANRI, Aldactone, or SGLT2 inhibitor at this time in light of his current renal  insufficiency.  Signed, Rozann Lesches, MD  04/19/2021, 8:20 AM

## 2021-04-19 NOTE — TOC Initial Note (Signed)
Transition of Care [facility]) - Initial/Assessment Note    Patient Details  Name: Keith Snow MRN: [id number] Date of Birth: May 22, 1961  [facility] CM/SW Contact:    Keith Lucks, RN Phone Number: 04/19/2021, 11:09 AM  Clinical Narrative:       Patient admitted with acute respiratory failure with hypoxia. Patient is from [place]. TOC spoke with patient and wife, the plan is to return to [place] when stable for discharge. Patient was on home oxygen Dec - May with Keith Snow.   TOC referred to [facility] with [employer], they are a [employer]. She will update TOC with DC plan for home with oxygen. No other needs identified.                      Expected Discharge Plan: Home/Self Care Barriers to Discharge: Continued Medical Work up  Patient Goals and CMS Choice Patient states their goals for this hospitalization and ongoing recovery are:: to go home. CMS Medicare.gov Compare Post Acute Care list provided to:: Patient Choice offered to / list presented to : Patient  Expected Discharge Plan and Services Expected Discharge Plan: Home/Self Care      Living arrangements for the past 2 months: Single Family Home                 DME Arranged: Oxygen DME Agency: [employer] Date DME Agency Contacted: 04/19/21 Time DME Agency Contacted: 56 Representative spoke with at DME Agency: Keith Snow   Prior Living Arrangements/Services Living arrangements for the past 2 months: [place] with:: Spouse Patient language and need for interpreter reviewed:: Yes        Need for Family Participation in Patient Care: Yes (Comment) Care giver support system in place?: Yes (comment)   Criminal Activity/Legal Involvement Pertinent to Current Situation/Hospitalization: No - Comment as needed  Activities of Daily Living Home Assistive Devices/Equipment: Walker (specify type) ADL Screening (condition at time of admission) Patient's cognitive ability adequate to safely complete  daily activities?: Yes Is the patient deaf or have difficulty hearing?: No Does the patient have difficulty seeing, even when wearing glasses/contacts?: No Does the patient have difficulty concentrating, remembering, or making decisions?: No Patient able to express need for assistance with ADLs?: No Does the patient have difficulty dressing or bathing?: No Independently performs ADLs?: Yes (appropriate for developmental age) Does the patient have difficulty walking or climbing stairs?: Yes Weakness of Legs: Both Weakness of Arms/Hands: None  Permission Sought/Granted Permission sought to share information with : Case Manager    Share Information with NAME: Keith Snow     Permission granted to share info w Relationship: Wife     Emotional Assessment     Affect (typically observed): Accepting, Pleasant Orientation: : Oriented to Self, Oriented to Place, Oriented to  Time, Oriented to Situation Alcohol / Substance Use: Not Applicable Psych Involvement: No (comment)  Admission diagnosis:  Acute respiratory failure with hypoxia (HCC) [J96.01] Fluid overload [E87.70] Patient Active Problem List   Diagnosis Date Noted   Acute on chronic diastolic CHF (congestive heart failure) (HCC) 04/18/2021   Fluid overload    Acute on chronic respiratory failure with hypoxia and hypercapnia (HCC)    Acute respiratory failure with hypoxia ([place]) 04/10/2021   Hyperglycemia 04/10/2021   Type 2 diabetes mellitus with hypoglycemia without coma ([place]) 04/10/2021   HTN (hypertension) 04/10/2021   AKI (acute kidney injury) ([place]) 04/10/2021   PCP:  System, Provider Not In Pharmacy:   [facility] [phone] [facility] -  Keith Dade, MD - [place] [address] Phone: [phone] Fax: [phone]     Social Determinants of Health (SDOH) Interventions    Readmission Risk Interventions No flowsheet data found.

## 2021-04-20 ENCOUNTER — Inpatient Hospital Stay (HOSPITAL_COMMUNITY): Payer: Medicare Other

## 2021-04-20 DIAGNOSIS — J9601 Acute respiratory failure with hypoxia: Secondary | ICD-10-CM | POA: Diagnosis not present

## 2021-04-20 DIAGNOSIS — E8779 Other fluid overload: Secondary | ICD-10-CM | POA: Diagnosis not present

## 2021-04-20 DIAGNOSIS — I5033 Acute on chronic diastolic (congestive) heart failure: Secondary | ICD-10-CM | POA: Diagnosis not present

## 2021-04-20 DIAGNOSIS — N179 Acute kidney failure, unspecified: Secondary | ICD-10-CM | POA: Diagnosis not present

## 2021-04-20 LAB — BASIC METABOLIC PANEL
Anion gap: 10 (ref 5–15)
BUN: 104 mg/dL — ABNORMAL HIGH (ref 6–20)
CO2: 21 mmol/L — ABNORMAL LOW (ref 22–32)
Calcium: 7.4 mg/dL — ABNORMAL LOW (ref 8.9–10.3)
Chloride: 106 mmol/L (ref 98–111)
Creatinine, Ser: 2.84 mg/dL — ABNORMAL HIGH (ref 0.61–1.24)
GFR, Estimated: 25 mL/min — ABNORMAL LOW (ref >60)
Glucose, Bld: 64 mg/dL — ABNORMAL LOW (ref 70–99)
Potassium: 3.3 mmol/L — ABNORMAL LOW (ref 3.5–5.1)
Sodium: 137 mmol/L (ref 135–145)

## 2021-04-20 LAB — GLUCOSE, CAPILLARY
Glucose-Capillary: 101 mg/dL — ABNORMAL HIGH (ref 70–99)
Glucose-Capillary: 36 mg/dL — CL (ref 70–99)
Glucose-Capillary: 72 mg/dL (ref 70–99)
Glucose-Capillary: 148 mg/dL — ABNORMAL HIGH (ref 70–99)
Glucose-Capillary: 148 mg/dL — ABNORMAL HIGH (ref 70–99)

## 2021-04-20 MED ORDER — POTASSIUM CHLORIDE CRYS ER 20 MEQ PO TBCR
20.0000 meq | EXTENDED_RELEASE_TABLET | Freq: Every day | ORAL | Status: DC
  Administered 2021-04-20 – 2021-04-21 (×2): 20 meq via ORAL
  Filled 2021-04-20 (×2): qty 1

## 2021-04-20 MED ORDER — INSULIN ASPART 100 UNIT/ML IJ SOLN
0.0000 [IU] | Freq: Every day | INTRAMUSCULAR | Status: DC
  Administered 2021-04-21 – 2021-04-22 (×2): 4 [IU] via SUBCUTANEOUS
  Administered 2021-04-23 – 2021-04-24 (×2): 3 [IU] via SUBCUTANEOUS
  Administered 2021-04-25: 2 [IU] via SUBCUTANEOUS
  Administered 2021-04-26: 4 [IU] via SUBCUTANEOUS
  Administered 2021-04-27: 3 [IU] via SUBCUTANEOUS
  Administered 2021-04-28: 4 [IU] via SUBCUTANEOUS

## 2021-04-20 MED ORDER — PREDNISONE 20 MG PO TABS: 40.0000 mg | ORAL_TABLET | Freq: Two times a day (BID) | ORAL | Status: DC

## 2021-04-20 MED ORDER — METHYLPREDNISOLONE SODIUM SUCC 40 MG IJ SOLR
40.0000 mg | Freq: Three times a day (TID) | INTRAMUSCULAR | Status: DC
  Administered 2021-04-20 – 2021-04-23 (×9): 40 mg via INTRAVENOUS
  Filled 2021-04-20 (×10): qty 1

## 2021-04-20 MED ORDER — INSULIN GLARGINE-YFGN 100 UNIT/ML ~~LOC~~ SOLN
40.0000 [IU] | Freq: Every day | SUBCUTANEOUS | Status: DC
  Administered 2021-04-20 – 2021-04-23 (×4): 40 [IU] via SUBCUTANEOUS
  Filled 2021-04-20 (×6): qty 0.4

## 2021-04-20 MED ORDER — INSULIN ASPART 100 UNIT/ML IJ SOLN
0.0000 [IU] | Freq: Three times a day (TID) | INTRAMUSCULAR | Status: DC
  Administered 2021-04-20: 1 [IU] via SUBCUTANEOUS
  Administered 2021-04-21: 5 [IU] via SUBCUTANEOUS
  Administered 2021-04-21: 7 [IU] via SUBCUTANEOUS
  Administered 2021-04-21: 2 [IU] via SUBCUTANEOUS
  Administered 2021-04-22: 7 [IU] via SUBCUTANEOUS
  Administered 2021-04-22: 2 [IU] via SUBCUTANEOUS
  Administered 2021-04-22 – 2021-04-23 (×3): 5 [IU] via SUBCUTANEOUS
  Administered 2021-04-23: 7 [IU] via SUBCUTANEOUS
  Administered 2021-04-24: 3 [IU] via SUBCUTANEOUS
  Administered 2021-04-24: 5 [IU] via SUBCUTANEOUS
  Administered 2021-04-24: 3 [IU] via SUBCUTANEOUS
  Administered 2021-04-25: 7 [IU] via SUBCUTANEOUS
  Administered 2021-04-25 (×2): 5 [IU] via SUBCUTANEOUS
  Administered 2021-04-26: 7 [IU] via SUBCUTANEOUS
  Administered 2021-04-26: 5 [IU] via SUBCUTANEOUS
  Administered 2021-04-26 – 2021-04-27 (×2): 3 [IU] via SUBCUTANEOUS
  Administered 2021-04-27: 7 [IU] via SUBCUTANEOUS
  Administered 2021-04-27: 5 [IU] via SUBCUTANEOUS
  Administered 2021-04-28: 3 [IU] via SUBCUTANEOUS
  Administered 2021-04-28: 2 [IU] via SUBCUTANEOUS
  Administered 2021-04-28: 7 [IU] via SUBCUTANEOUS
  Administered 2021-04-29 (×2): 2 [IU] via SUBCUTANEOUS
  Administered 2021-04-30 – 2021-05-01 (×4): 3 [IU] via SUBCUTANEOUS
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
## 2021-04-20 NOTE — Progress Notes (Signed)
Progress Note  Patient Name: Keith Snow Date of Encounter: 04/20/2021  Primary Cardiologist: Gwen Pounds, MD ([place])  Subjective   Still short of breath, no chest pain.  No active cough.  Inpatient Medications    Scheduled Meds:  amLODipine  5 mg Oral Daily   aspirin EC  81 mg Oral Daily   budesonide (PULMICORT) nebulizer solution  0.5 mg Nebulization BID   bumetanide  1 mg Oral BID   buPROPion  100 mg Oral BID   carvedilol  25 mg Oral BID WC   Chlorhexidine Gluconate Cloth  6 each Topical Daily   cloNIDine  0.3 mg Oral TID   ferrous sulfate  325 mg Oral Q breakfast   heparin  5,000 Units Subcutaneous Q8H   hydrALAZINE  100 mg Oral Q8H   insulin aspart  0-15 Units Subcutaneous TID WC   insulin aspart  0-5 Units Subcutaneous QHS   insulin aspart  5 Units Subcutaneous TID WC   insulin glargine-yfgn  46 Units Subcutaneous Daily   ipratropium-albuterol  3 mL Nebulization Q6H   isosorbide mononitrate  30 mg Oral Daily   mouth rinse  15 mL Mouth Rinse BID   pantoprazole  40 mg Oral Daily   predniSONE  40 mg Oral BID   rosuvastatin  20 mg Oral Daily   Continuous Infusions:  piperacillin-tazobactam (ZOSYN)  IV 3.375 g (04/20/21 0613)   PRN Meds: acetaminophen **OR** acetaminophen, ipratropium-albuterol, labetalol, ondansetron **OR** ondansetron (ZOFRAN) IV, oxyCODONE, senna-docusate, traZODone   Vital Signs    Vitals:   04/20/21 0400 04/20/21 0500 04/20/21 0600 04/20/21 0758  BP: (!) 164/76 (!) 172/93 (!) 179/99   Pulse:  72 71   Resp: 20 20 14    Temp:      TempSrc:      SpO2: 93% 92% 91% 92%  Weight:  107.3 kg    Height:        Intake/Output Summary (Last 24 hours) at 04/20/2021 0855 Last data filed at 04/20/2021 0400 Gross per 24 hour  Intake 329.8 ml  Output 2250 ml  Net -1920.2 ml   Filed Weights   04/17/21 0500 04/18/21 0500 04/20/21 0500  Weight: 106.4 kg 107.1 kg 107.3 kg    Telemetry    Sinus rhythm.  Personally reviewed.  ECG     An ECG dated 04/10/2021 was personally reviewed today and demonstrated:  Sinus rhythm with left atrial enlargement, nonspecific ST changes.  Physical Exam   GEN: No acute distress.   Neck: No JVD. Cardiac: RRR without gallop.  Respiratory: Nonlabored.  Decreased breath sounds at the bases. GI: Soft, nontender, bowel sounds present. MS: No pitting edema; No deformity. Neuro:  Nonfocal. Psych: Alert and oriented x 3. Normal affect.  Labs    Chemistry Recent Labs  Lab 04/15/21 0459 04/16/21 0439 04/17/21 0649 04/18/21 0403 04/19/21 0405 04/20/21 0500  NA 134* 133*   < > 137 135 137  K 4.2 4.0   < > 3.6 3.7 3.3*  CL 102 100   < > 104 104 106  CO2 19* 19*   < > 24 22 21*  GLUCOSE 245* 191*   < > 124* 206* 64*  BUN 97* 114*   < > 111* 112* 104*  CREATININE 3.49* 3.84*   < > 3.04* 3.09* 2.84*  CALCIUM 7.6* 7.3*   < > 7.2* 7.3* 7.4*  PROT 6.6  --   --  6.0* 6.2*  --   ALBUMIN 2.5* 2.5*  --  2.2* 2.3*  --   AST 29  --   --  11* 11*  --   ALT 24  --   --  17 16  --   ALKPHOS 129*  --   --  75 65  --   BILITOT 0.7  --   --  0.4 0.7  --   GFRNONAA 19* 17*   < > 23* 23* 25*  ANIONGAP 13 14   < > 9 9 10    < > = values in this interval not displayed.     Hematology Recent Labs  Lab 04/17/21 0649 04/18/21 0403 04/19/21 0405  WBC 8.6 9.0 9.4  RBC 2.92* 2.75* 2.94*  HGB 8.8* 8.4* 8.8*  HCT 27.5* 26.0* 27.5*  MCV 94.2 94.5 93.5  MCH 30.1 30.5 29.9  MCHC 32.0 32.3 32.0  RDW 13.4 13.5 13.7  PLT 254 251 267    Cardiac Enzymes Recent Labs  Lab 04/10/21 0009 04/10/21 0215  TROPONINIHS 91* 87*    BNP Recent Labs  Lab 04/14/21 0435 04/15/21 0459 04/16/21 0439  BNP 1,360.0* 1,390.0* 1,341.0*     Radiology    DG Chest Port 1 View  Result Date: 04/20/2021 CLINICAL DATA:  Acute respiratory failure EXAM: PORTABLE CHEST 1 VIEW COMPARISON:  04/14/2021 FINDINGS: Bilateral airspace disease again noted, unchanged. Heart is mildly enlarged. Possible small layering left  effusion. Scattered aortic calcifications. No acute bony abnormality. IMPRESSION: Diffuse bilateral airspace disease, unchanged. Suspect layering left effusion. Electronically Signed   By: Rolm Baptise M.D.   On: 04/20/2021 08:02    Cardiac Studies   Echocardiogram 04/10/2021:  1. Left ventricular ejection fraction, by estimation, is 55 to 60%. The  left ventricle has normal function. The left ventricle has no regional  wall motion abnormalities. There is moderate concentric left ventricular  hypertrophy. Left ventricular  diastolic parameters are consistent with Grade II diastolic dysfunction  (pseudonormalization).   2. Right ventricular systolic function is normal. The right ventricular  size is normal. There is normal pulmonary artery systolic pressure. The  estimated right ventricular systolic pressure is 90.3 mmHg.   3. Left atrial size was moderately dilated.   4. Right atrial size was mildly dilated.   5. The mitral valve is grossly normal. No evidence of mitral valve  regurgitation. No evidence of mitral stenosis.   6. The aortic valve is tricuspid. Aortic valve regurgitation is not  visualized. No aortic stenosis is present.   7. The inferior vena cava is normal in size with greater than 50%  respiratory variability, suggesting right atrial pressure of 3 mmHg.   Assessment & Plan    1.  Acute on chronic diastolic heart failure, LVEF 55 to 60% with moderate LVH, moderate diastolic dysfunction, and normal RV contraction as well as estimated RVSP.  Outside records from his primary cardiologist indicate negative PYP scan for TTR amyloidosis.  He has been managed medically.  Taken off Entresto and spironolactone in light of AKI.  Urine output improving as his creatinine.  Diuretics switched from IV Lasix to Bumex 1 mg twice daily.  Currently hypokalemic in the setting of diuresis.  2.  AKI on CKD stage IIIb.  Baseline creatinine 1.7-2.1, currently 2.84.  Nephrology is following.  3.   Uncontrolled type 2 diabetes mellitus.  Hemoglobin A1c 10.3%.  Presently on insulin, had been on Jardiance as an outpatient per records.  4.  Essential hypertension, blood pressure control is not optimal.  Norvasc has been added.  5.  Mixed hyperlipidemia, on Crestor.  Continue Coreg, hydralazine, Imdur, and Bumex.  Add potassium supplement.  Norvasc also started for further blood pressure control.  He is not a candidate for ARB, ANRI, Aldactone, or SGLT2 inhibitor at this time in light of his current renal insufficiency.  Signed, Rozann Lesches, MD  04/20/2021, 8:55 AM

## 2021-04-20 NOTE — Progress Notes (Signed)
PROGRESS NOTE  Aquarius Latouche JYN:[id number] DOB: 23-Jul-1961 DOA: 04/09/2021 PCP: System, Provider Not In   Brief History:  60 year old with history of DM2, HTN, MI, CHF comes to the ED with change in mental status.  Upon admission he was noted to have focal consolidation in the right lower lung, elevated BNP.  He was also hyperglycemic.  CT of the chest showed combination of severe bilateral bronchopneumonia and possible effusion.  It has been difficult to diurese him due to worsening renal function.  Nephrology and Pulm consulted for their input. Started on Broad Spectrum Abx and Steroids. Cr continues to increase slowly.    Assessment/Plan: Acute hypoxic respiratory failure requiring BiPAP and HiFlow - Multifactorial.  Combination of PNA, COPD and CHF exacerbation. -9/19 CT chest Severe b/l BronchoPNA with small b/l parapneumonic effusions.  -pt had been weaning down to 6L HFNC but on 9/27-->increase SOB and back on BiPAP -9/27 CXR personally reviewed-unchanged diffuse bilateral pulm infiltrates   Community-acquired pneumonia. Aspiration PNA?  Concerns for BOOP - Pro-Cal 0.54>>0.34  -Cleared by speech and swallow for regular diet. - Currently on IV Zosyn 7/20>>  Pulmonary suggested Solu-Medrol 80 mg every 12>>40mg   q 12 - Bronchodilators scheduled and as needed.  I-S/flutter -Speech and swallow eval-regular diet -MRSA neg -9/26 discussed with Pulm, Dr. Alfonzo Beers to po steroids -patient finished 7 days zosyn -9/27--restarted IV steroids   Diabetes mellitus type 2, uncontrolled with hyperglycemia - 9/17 A1c-10.3 - Increase Semglee to 46 units daily.  NovoLog 5 units Premeal - Sliding scale and Accu-Chek   Acute on chronic congestive heart failure with preserved EF, grade 2 DD, class III -04/10/21 Echo 55-60%, grade 2 DD, moderate LVH - Lasix IV by nephrology>>transition to 1/2 home dose bumex -9/26 discussed with renal--Dr. Joelyn Oms -previously on Entresto,  spironolactone which are held due to AKI -UO improved with improving renal function--restarted half home dose Bumex on 9/26>>continue   Acute kidney injury on CKD stage IIIa - Admission creatinine 1.8, 2.63 > 2.71 > 3.17 >3.49 >3.84>>3.04 -Currently producing about 1.5 L of urine over last 24 hours.   -BUN rising as well but this could also be secondary to steroids.  Potassium and bicarb are relatively stable.   -Renal ultrasound is negative for acute pathology.   -appreciate renal follow up -he does not look to be florridly fluid overloaded -previously on Bumex 2 mg bid when he lived in [place] improved with improving renal function--restarted half home dose Bumex on 8/65>>HQIONGEX   Acute metabolic encephalopathy -Resolved.    Essential hypertension -Coreg,  hydralazine, clonidine -nifedipine stopped due to concerns for shunt -add imdur and amlodipine  Hyperlipidemia - Crestor             Status is: Inpatient   Remains inpatient appropriate because:Inpatient level of care appropriate due to severity of illness   Dispo: The patient is from: Home              Anticipated d/c is to: Home              Patient currently is not medically stable to d/c.              Difficult to place patient No               Family Communication:   spouse updated 9/27   Consultants:  pulm   Code Status:  FULL    DVT Prophylaxis:  [place] Heparin  Procedures: As Listed in Progress Note Above   Antibiotics: Zosyn 9/20>>9/27     Subjective: Patient more sob with transfers.  Denies f/c, cp, n/v/d, abd pain. Headache.  Objective: Vitals:   04/20/21 1400 04/20/21 1452 04/20/21 1616 04/20/21 1620  BP: (!) 159/98 (!) 161/90  (!) 156/58  Pulse: 62   64  Resp: 15  17   Temp:      TempSrc:      SpO2: 97%   96%  Weight:      Height:        Intake/Output Summary (Last 24 hours) at 04/20/2021 1632 Last data filed at 04/20/2021 0400 Gross per 24 hour  Intake 329.8 ml  Output  1450 ml  Net -1120.2 ml   Weight change:  Exam:  General:  Pt is alert, follows commands appropriately, not in acute distress HEENT: No icterus, No thrush, No neck mass, [place]/AT Cardiovascular: RRR, S1/S2, no rubs, no gallops Respiratory: bilateral crackles. No wheeze Abdomen: Soft/+BS, non tender, non distended, no guarding Extremities: No edema, No lymphangitis, No petechiae, No rashes, no synovitis   Data Reviewed: I have personally reviewed following labs and imaging studies Basic Metabolic Panel: Recent Labs  Lab 04/14/21 0435 04/15/21 0459 04/16/21 0439 04/17/21 0649 04/18/21 0403 04/19/21 0405 04/20/21 0500  NA 134* 134* 133* 135 137 135 137  K 4.3 4.2 4.0 3.7 3.6 3.7 3.3*  CL 102 102 100 103 104 104 106  CO2 21* 19* 19* 20* 24 22 21*  GLUCOSE 350* 245* 191* 237* 124* 206* 64*  BUN 78* 97* 114* 126* 111* 112* 104*  CREATININE 3.17* 3.49* 3.84* 3.29* 3.04* 3.09* 2.84*  CALCIUM 7.7* 7.6* 7.3* 7.4* 7.2* 7.3* 7.4*  MG 2.6* 2.8* 2.8* 2.9*  --   --   --   PHOS  --   --  5.6*  --   --   --   --    Liver Function Tests: Recent Labs  Lab 04/14/21 0435 04/15/21 0459 04/16/21 0439 04/18/21 0403 04/19/21 0405  AST 23 29  --  11* 11*  ALT 17 24  --  17 16  ALKPHOS 100 129*  --  75 65  BILITOT 0.7 0.7  --  0.4 0.7  PROT 6.7 6.6  --  6.0* 6.2*  ALBUMIN 2.5* 2.5* 2.5* 2.2* 2.3*   No results for input(s): LIPASE, AMYLASE in the last 168 hours. No results for input(s): AMMONIA in the last 168 hours. Coagulation Profile: No results for input(s): INR, PROTIME in the last 168 hours. CBC: Recent Labs  Lab 04/15/21 0459 04/16/21 0439 04/17/21 0649 04/18/21 0403 04/19/21 0405  WBC 6.2 8.6 8.6 9.0 9.4  HGB 8.9* 8.6* 8.8* 8.4* 8.8*  HCT 27.7* 27.0* 27.5* 26.0* 27.5*  MCV 95.8 94.7 94.2 94.5 93.5  PLT 193 211 254 251 267   Cardiac Enzymes: No results for input(s): CKTOTAL, CKMB, CKMBINDEX, TROPONINI in the last 168 hours. BNP: Invalid input(s): POCBNP CBG: Recent  Labs  Lab 04/19/21 1635 04/19/21 2105 04/20/21 0745 04/20/21 0810 04/20/21 1128  GLUCAP 206* 118* 36* 101* 72   HbA1C: No results for input(s): HGBA1C in the last 72 hours. Urine analysis:    Component Value Date/Time   COLORURINE YELLOW 04/14/2021 1803   APPEARANCEUR CLEAR 04/14/2021 1803   LABSPEC >1.030 (H) 04/14/2021 1803   PHURINE 5.0 04/14/2021 1803   GLUCOSEU NEGATIVE 04/14/2021 1803   HGBUR NEGATIVE 04/14/2021 1803   BILIRUBINUR NEGATIVE 04/14/2021 1803   KETONESUR NEGATIVE 04/14/2021 1803  PROTEINUR TRACE (A) 04/14/2021 1803   NITRITE NEGATIVE 04/14/2021 1803   LEUKOCYTESUR NEGATIVE 04/14/2021 1803   Sepsis Labs: @LABRCNTIP (procalcitonin:4,lacticidven:4) ) Recent Results (from the past 240 hour(s))  Expectorated Sputum Assessment w Gram Stain, Rflx to Resp Cult     Status: None   Collection Time: 04/16/21  7:00 PM   Specimen: Sputum  Result Value Ref Range Status   Specimen Description SPUTUM  Final   Special Requests NONE  Final   Sputum evaluation   Final    THIS SPECIMEN IS ACCEPTABLE FOR SPUTUM CULTURE PERFORMED AT [facility] Performed at [facility], [address] [place], [place] [address]    Report Status 04/16/2021 FINAL  Final  Culture, Respiratory w Gram Stain     Status: None   Collection Time: 04/16/21  7:00 PM   Specimen: SPU  Result Value Ref Range Status   Specimen Description   Final    SPUTUM Performed at [facility], [address], [place], [place] [address]    Special Requests   Final    NONE Reflexed from [id number] Performed at [facility], [address] [place], [place] [address]    Gram Stain   Final    NO WBC SEEN RARE SQUAMOUS EPITHELIAL CELLS PRESENT RARE GRAM POSITIVE COCCI RARE YEAST    Culture   Final    FEW Normal respiratory flora-no Staph aureus or Pseudomonas seen Performed at [facility], [place] [address], [place], [place] [address]    Report Status 04/19/2021 FINAL  Final     Scheduled Meds:  amLODipine  5  mg Oral Daily   aspirin EC  81 mg Oral Daily   budesonide (PULMICORT) nebulizer solution  0.5 mg Nebulization BID   bumetanide  1 mg Oral BID   buPROPion  100 mg Oral BID   carvedilol  25 mg Oral BID WC   Chlorhexidine Gluconate Cloth  6 each Topical Daily   cloNIDine  0.3 mg Oral TID   ferrous sulfate  325 mg Oral Q breakfast   heparin  5,000 Units Subcutaneous Q8H   hydrALAZINE  100 mg Oral Q8H   insulin aspart  0-5 Units Subcutaneous QHS   insulin aspart  0-9 Units Subcutaneous TID WC   insulin glargine-yfgn  40 Units Subcutaneous QHS   ipratropium-albuterol  3 mL Nebulization Q6H   isosorbide mononitrate  30 mg Oral Daily   mouth rinse  15 mL Mouth Rinse BID   methylPREDNISolone (SOLU-MEDROL) injection  40 mg Intravenous Q8H   pantoprazole  40 mg Oral Daily   potassium chloride  20 mEq Oral Daily   rosuvastatin  20 mg Oral Daily   Continuous Infusions:  Procedures/Studies: CT CHEST WO CONTRAST  Result Date: 04/12/2021 CLINICAL DATA:  60 year old male with history of acute hypoxic respiratory failure requiring BiPAP. EXAM: CT CHEST WITHOUT CONTRAST TECHNIQUE: Multidetector CT imaging of the chest was performed following the standard protocol without IV contrast. COMPARISON:  No priors. FINDINGS: Cardiovascular: Heart size is normal. There is no significant pericardial fluid, thickening or pericardial calcification. There is aortic atherosclerosis, as well as atherosclerosis of the great vessels of the mediastinum and the coronary arteries, including calcified atherosclerotic plaque in the left anterior descending and left circumflex coronary arteries. Mediastinum/Nodes: Multiple prominent borderline enlarged mediastinal and bilateral hilar lymph nodes are noted, nonspecific and likely reactive. Esophagus is unremarkable in appearance. No axillary lymphadenopathy. Lungs/Pleura: Small bilateral pleural effusions (right greater than left) lying dependently. Patchy multifocal airspace  consolidation most  evident in a peribronchovascular distribution with surrounding ground-glass attenuation and septal thickening. Upper Abdomen: Unremarkable. Musculoskeletal: There are no aggressive appearing lytic or blastic lesions noted in the visualized portions of the skeleton. Multiple old healed rib fractures. IMPRESSION: 1. Severe multilobar bilateral bronchopneumonia with small bilateral parapneumonic pleural effusions lying dependently, as above. 2. Aortic atherosclerosis, in addition to 2 vessel coronary artery disease. Please note that although the presence of coronary artery calcium documents the presence of coronary artery disease, the severity of this disease and any potential stenosis cannot be assessed on this non-gated CT examination. Assessment for potential risk factor modification, dietary therapy or pharmacologic therapy may be warranted, if clinically indicated. Aortic Atherosclerosis (ICD10-I70.0). Electronically Signed   By: Vinnie Langton M.D.   On: 04/12/2021 18:58   US RENAL  Result Date: 04/14/2021 CLINICAL DATA:  Acute renal injury EXAM: RENAL / URINARY TRACT ULTRASOUND COMPLETE COMPARISON:  None. FINDINGS: Right Kidney: Renal measurements: 12.3 x 4.5 x 5.7 cm. = volume: 165 mL. Echogenicity within normal limits. No mass or hydronephrosis visualized. Left Kidney: Renal measurements: 10.9 x 6.0 x 4.9 cm. = volume: 66 mL. Echogenicity within normal limits. No mass or hydronephrosis visualized. Bladder: Bladder is well distended. Mild wall thickening is noted anteriorly measuring up to 9 mm. This could be related to underlying UTI. Clinical correlation is recommended. Other: None. IMPRESSION: Normal-appearing kidneys bilaterally. Mild bladder wall thickening which may be inflammatory in nature. Clinical correlation is recommended. Electronically Signed   By: Inez Catalina M.D.   On: 04/14/2021 15:54   DG Chest Port 1 View  Result Date: 04/20/2021 CLINICAL DATA:  Acute respiratory  failure EXAM: PORTABLE CHEST 1 VIEW COMPARISON:  04/14/2021 FINDINGS: Bilateral airspace disease again noted, unchanged. Heart is mildly enlarged. Possible small layering left effusion. Scattered aortic calcifications. No acute bony abnormality. IMPRESSION: Diffuse bilateral airspace disease, unchanged. Suspect layering left effusion. Electronically Signed   By: Rolm Baptise M.D.   On: 04/20/2021 08:02   DG Chest Port 1 View  Result Date: 04/14/2021 CLINICAL DATA:  Shortness of breath, history of stroke, diabetes mellitus, hypertension, MI, cancer EXAM: PORTABLE CHEST 1 VIEW COMPARISON:  Portable exam 1340 hours compared to 04/10/2021 FINDINGS: Enlargement of cardiac silhouette with pulmonary vascular congestion. Stable mediastinal contours. BILATERAL pulmonary infiltrates question pulmonary edema, slightly greater at RIGHT base. Small BILATERAL pleural effusions. No pneumothorax or acute osseous findings. IMPRESSION: Enlargement of cardiac silhouette with pulmonary vascular congestion and mild pulmonary edema. Small BILATERAL pleural effusions. Electronically Signed   By: Lavonia Dana M.D.   On: 04/14/2021 16:01   DG CHEST PORT 1 VIEW  Result Date: 04/10/2021 CLINICAL DATA:  Weakness and fatigue. EXAM: PORTABLE CHEST 1 VIEW COMPARISON:  04/09/2021 FINDINGS: 0657 hours. The cardio pericardial silhouette is enlarged. Focal airspace disease again noted right base with some probable atelectasis or infiltrate in the retrocardiac left base. There is pulmonary vascular congestion without overt pulmonary edema. Superimposed component of interstitial pulmonary edema suspected. IMPRESSION: Asymmetric focal airspace disease right base with some probable atelectasis or infiltrate in the retrocardiac left base. Interstitial edema not excluded. Electronically Signed   By: Misty Stanley M.D.   On: 04/10/2021 07:33   DG Chest Port 1 View  Result Date: 04/09/2021 CLINICAL DATA:  Fever and weakness EXAM: PORTABLE CHEST 1  VIEW COMPARISON:  None. FINDINGS: The heart and mediastinal contours are within normal limits. Aortic calcification. Right lower lung zone focal consolidation. Question retrocardiac opacity. No pulmonary edema. Possible trace left pleural effusion.  No right pleural effusion. No pneumothorax. No acute osseous abnormality. IMPRESSION: Right lower lung zone focal consolidation. Question retrocardiac opacity with possible trace left pleural effusion. Findings consistent with infection/inflammation. Followup PA and lateral chest X-ray is recommended in 3-4 weeks following therapy to ensure resolution and exclude underlying malignancy. Electronically Signed   By: Iven Finn M.D.   On: 04/09/2021 23:59   ECHOCARDIOGRAM COMPLETE  Result Date: 04/10/2021    ECHOCARDIOGRAM REPORT   Patient Name:   CREG Burzynski Date of Exam: 04/10/2021 Medical Rec #:  [id number]    Height:       69.0 in Accession #:    [id number]   Weight:       217.0 lb Date of Birth:  1961/05/02    BSA:          2.139 m Patient Age:    76 years     BP:           142/83 mmHg Patient Gender: M            HR:           85 bpm. Exam Location:  [facility] Procedure: 2D Echo, Cardiac Doppler and Color Doppler Indications:    SBE I33.9  History:        Patient has no prior history of Echocardiogram examinations.                 Previous Myocardial Infarction, Stroke; Risk                 Factors:Hypertension and Diabetes. Hx of cancer. 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  Sonographer:    Alvino Chapel RCS Referring Phys: [place]  1. Left ventricular ejection fraction, by estimation, is 55 to 60%. The left ventricle has normal function. The left ventricle has no regional wall motion abnormalities. There is moderate concentric left ventricular hypertrophy. Left ventricular diastolic parameters are consistent with Grade II diastolic dysfunction (pseudonormalization).  2. Right ventricular systolic function  is normal. The right ventricular size is normal. There is normal pulmonary artery systolic pressure. The estimated right ventricular systolic pressure is 94.8 mmHg.  3. Left atrial size was moderately dilated.  4. Right atrial size was mildly dilated.  5. The mitral valve is grossly normal. No evidence of mitral valve regurgitation. No evidence of mitral stenosis.  6. The aortic valve is tricuspid. Aortic valve regurgitation is not visualized. No aortic stenosis is present.  7. The inferior vena cava is normal in size with greater than 50% respiratory variability, suggesting right atrial pressure of 3 mmHg. FINDINGS  Left Ventricle: Left ventricular ejection fraction, by estimation, is 55 to 60%. The left ventricle has normal function. The left ventricle has no regional wall motion abnormalities. The left ventricular internal cavity size was normal in size. There is  moderate concentric left ventricular hypertrophy. Left ventricular diastolic parameters are consistent with Grade II diastolic dysfunction (pseudonormalization). Right Ventricle: The right ventricular size is normal. No increase in right ventricular wall thickness. Right ventricular systolic function is normal. There is normal pulmonary artery systolic pressure. The tricuspid regurgitant velocity is 2.10 m/s, and  with an assumed right atrial pressure of 3 mmHg, the estimated right ventricular systolic pressure is 54.6 mmHg. Left Atrium: Left atrial size was moderately dilated. Right Atrium: Right atrial size was mildly dilated. Pericardium: There is no evidence of  pericardial effusion. Mitral Valve: The mitral valve is grossly normal. No evidence of mitral valve regurgitation. No evidence of mitral valve stenosis. Tricuspid Valve: The tricuspid valve is grossly normal. Tricuspid valve regurgitation is trivial. No evidence of tricuspid stenosis. Aortic Valve: The aortic valve is tricuspid. Aortic valve regurgitation is not visualized. No aortic stenosis  is present. Pulmonic Valve: The pulmonic valve was grossly normal. Pulmonic valve regurgitation is not visualized. No evidence of pulmonic stenosis. Aorta: The aortic root is normal in size and structure. Venous: The inferior vena cava is normal in size with greater than 50% respiratory variability, suggesting right atrial pressure of 3 mmHg. IAS/Shunts: The atrial septum is grossly normal.  LEFT VENTRICLE PLAX 2D LVIDd:         4.70 cm  Diastology LVIDs:         3.10 cm  LV e' medial:    6.74 cm/s LV PW:         1.50 cm  LV E/e' medial:  16.5 LV IVS:        1.50 cm  LV e' lateral:   9.14 cm/s LVOT diam:     2.00 cm  LV E/e' lateral: 12.1 LV SV:         65 LV SV Index:   31 LVOT Area:     3.14 cm  RIGHT VENTRICLE RV S prime:     13.80 cm/s TAPSE (M-mode): 2.6 cm LEFT ATRIUM             Index       RIGHT ATRIUM           Index LA diam:        4.20 cm 1.96 cm/m  RA Area:     24.30 cm LA Vol (A2C):   79.4 ml 37.12 ml/m RA Volume:   81.40 ml  38.06 ml/m LA Vol (A4C):   93.6 ml 43.76 ml/m LA Biplane Vol: 86.2 ml 40.30 ml/m  AORTIC VALVE LVOT Vmax:   102.00 cm/s LVOT Vmean:  62.500 cm/s LVOT VTI:    0.208 m  AORTA Ao Root diam: 3.60 cm MITRAL VALVE                TRICUSPID VALVE MV Area (PHT): 4.15 cm     TR Peak grad:   17.6 mmHg MV Decel Time: 183 msec     TR Vmax:        210.00 cm/s MV E velocity: 111.00 cm/s MV A velocity: 79.70 cm/s   SHUNTS MV E/A ratio:  1.39         Systemic VTI:  0.21 m                             Systemic Diam: 2.00 cm Eleonore Chiquito MD Electronically signed by Eleonore Chiquito MD Signature Date/Time: 04/10/2021/1:25:55 PM    Final    [place] EKG SITE RITE  Result Date: 04/17/2021 If Site Rite image not attached, placement could not be confirmed due to current cardiac rhythm.   Orson Eva, DO  [facility]  If 7PM-7AM, please contact night-coverage [email or web address] Password [email or web address] 04/20/2021, 4:32 PM   LOS: 10 days

## 2021-04-20 NOTE — Progress Notes (Signed)
Patient noted O2 sats all morning to be fluctuating from 90-932% on 15 Liters Highflow. After patient ate lunch, patient O2 sat noted to be around 85-86% on 15 Liters HiFlow via nasal cannula. Patient appeared to have some labored breathing. Patient transferred back to bed with assistance and O2 sat dropped as low as 67% with O2 on while transferring and came back up to mid 80's when sitting on side of bed. Respiratory therapist was present also in room. Patient placed back on Bipap and O2 sat came back up to high 90's. Dr Elsworth Soho rounded on unit and made aware, new orders placed. Dr Tat also made aware.

## 2021-04-20 NOTE — Progress Notes (Signed)
Hypoglycemic Event  CBG: 36  Treatment: 8 oz juice/soda  Symptoms: None  Follow-up CBG: Time:0809 CBG Result:101  Possible Reasons for Event: Uknown  Comments/MD notified:Dr Tat    Merrilee Seashore

## 2021-04-20 NOTE — Progress Notes (Signed)
RT called per patients sats dropping (lowest seen by RT was 67) and increased SOB. RT placed patient on BIPAP with improvement in sats to 100%. Patient is tolerating well at this time. Will continue to monitor.

## 2021-04-20 NOTE — Progress Notes (Signed)
Inpatient Diabetes Program Recommendations  AACE/ADA: New Consensus Statement on Inpatient Glycemic Control (2015)  Target Ranges:  Prepandial:   less than 140 mg/dL      Peak postprandial:   less than 180 mg/dL (1-2 hours)      Critically ill patients:  140 - 180 mg/dL   Lab Results  Component Value Date   GLUCAP 101 (H) 04/20/2021   HGBA1C 10.3 (H) 04/10/2021    Review of Glycemic Control Results for DONYEL, NESTER (MRN [id number]) as of 04/20/2021 09:24  Ref. Range 04/19/2021 16:35 04/19/2021 21:05 04/20/2021 07:45 04/20/2021 08:10  Glucose-Capillary Latest Ref Range: 70 - 99 mg/dL 206 (H) 118 (H) 36 (LL) 101 (H)   Diabetes history: Type 2 DM Outpatient Diabetes medications: Jardiance 10 mg QD, Novolog TID, Basaglar 30 units QD Current orders for Inpatient glycemic control: Semglee 46 units QD, Novolog 5 units TID, Novolog 0-15 units & HS Prednisone 40 mg BID  Inpatient Diabetes Program Recommendations:    Noted hypoglycemia this AM of 36 mg/dL. Also, of note, patient tapered steroids today. Consider:   -Reducing Semglee to 40 units QD -Reducing correction to Novolog 0-6 units TID  Thanks, Bronson Curb, MSN, RNC-OB Diabetes Coordinator [phone] (8a-5p)

## 2021-04-20 NOTE — Progress Notes (Signed)
Admit: 04/09/2021 LOS: 53  26M AoCKD3, CAP, HFpEF with acute exacerbaiton  Subjective:  Good UOP with change to PO bumetaminde SCR and BUN cont to slowly improve.   -7.1L since admit BPs still up. Started amlodipine yesterday Remains on high flow [place]  09/26 0701 - 09/27 0700 In: 329.8 [P.O.:180; IV Piggyback:149.8] Out: 2250 [Urine:2250]  Filed Weights   04/17/21 0500 04/18/21 0500 04/20/21 0500  Weight: 106.4 kg 107.1 kg 107.3 kg    Scheduled Meds:  amLODipine  5 mg Oral Daily   aspirin EC  81 mg Oral Daily   budesonide (PULMICORT) nebulizer solution  0.5 mg Nebulization BID   bumetanide  1 mg Oral BID   buPROPion  100 mg Oral BID   carvedilol  25 mg Oral BID WC   Chlorhexidine Gluconate Cloth  6 each Topical Daily   cloNIDine  0.3 mg Oral TID   ferrous sulfate  325 mg Oral Q breakfast   heparin  5,000 Units Subcutaneous Q8H   hydrALAZINE  100 mg Oral Q8H   insulin aspart  0-15 Units Subcutaneous TID WC   insulin aspart  0-5 Units Subcutaneous QHS   insulin aspart  5 Units Subcutaneous TID WC   insulin glargine-yfgn  46 Units Subcutaneous Daily   ipratropium-albuterol  3 mL Nebulization Q6H   isosorbide mononitrate  30 mg Oral Daily   mouth rinse  15 mL Mouth Rinse BID   pantoprazole  40 mg Oral Daily   potassium chloride  20 mEq Oral Daily   predniSONE  40 mg Oral BID   rosuvastatin  20 mg Oral Daily   Continuous Infusions:  piperacillin-tazobactam (ZOSYN)  IV 3.375 g (04/20/21 0613)   PRN Meds:.acetaminophen **OR** acetaminophen, ipratropium-albuterol, labetalol, ondansetron **OR** ondansetron (ZOFRAN) IV, oxyCODONE, senna-docusate, traZODone  Current Labs: reviewed    Physical Exam:  Blood pressure (!) 174/90, pulse 71, temperature 97.7 F (36.5 C), temperature source Axillary, resp. rate 14, height 5\' 9"  (1.753 m), weight 107.3 kg, SpO2 92 %. NAD, sitting in chair, mild inc wob/inc RR Trace LEE at best RRR nl s1s2 Diminished in bases, clear otherwise NO  rashes/lesions AAO x3 Nonfocal EOMI,  NCAT  A AoCKD3, slowly resolving, nonoliguric, neg imaging at admission; has nephrologist in [place].   Hypoxic RF / CAP / BOOP, ABX and steroids per TRH/PCCM.  Improved, remains on 6L  HFpEF, vol status cont to slowly improve  HTN, not at goal Anemia, Hb stable 8s DM2, hyperglycemia, uncontrolled, per TRH HLD Hypokalemia,   P D/w PCCM and TRH,  Inc amlodipine tomorrow if remains hypertensive Cont Bumex at current dosing, which is 50% of his home dose prior to admission Cont to hold RAASi, SGLT2i Daily weights, Daily Renal Panel, Strict I/Os, Avoid nephrotoxins (NSAIDs, judicious IV Contrast)  Will follow along  Pearson Grippe MD 04/20/2021, 9:01 AM  Recent Labs  Lab 04/16/21 0439 04/17/21 0649 04/18/21 0403 04/19/21 0405 04/20/21 0500  NA 133*   < > 137 135 137  K 4.0   < > 3.6 3.7 3.3*  CL 100   < > 104 104 106  CO2 19*   < > 24 22 21*  GLUCOSE 191*   < > 124* 206* 64*  BUN 114*   < > 111* 112* 104*  CREATININE 3.84*   < > 3.04* 3.09* 2.84*  CALCIUM 7.3*   < > 7.2* 7.3* 7.4*  PHOS 5.6*  --   --   --   --    < > =  values in this interval not displayed.    Recent Labs  Lab 04/17/21 0649 04/18/21 0403 04/19/21 0405  WBC 8.6 9.0 9.4  HGB 8.8* 8.4* 8.8*  HCT 27.5* 26.0* 27.5*  MCV 94.2 94.5 93.5  PLT 254 251 267

## 2021-04-21 ENCOUNTER — Inpatient Hospital Stay (HOSPITAL_COMMUNITY): Payer: Medicare Other

## 2021-04-21 DIAGNOSIS — N179 Acute kidney failure, unspecified: Secondary | ICD-10-CM | POA: Diagnosis not present

## 2021-04-21 DIAGNOSIS — I5033 Acute on chronic diastolic (congestive) heart failure: Secondary | ICD-10-CM | POA: Diagnosis not present

## 2021-04-21 DIAGNOSIS — J9601 Acute respiratory failure with hypoxia: Secondary | ICD-10-CM | POA: Diagnosis not present

## 2021-04-21 LAB — GLUCOSE, CAPILLARY
Glucose-Capillary: 154 mg/dL — ABNORMAL HIGH (ref 70–99)
Glucose-Capillary: 266 mg/dL — ABNORMAL HIGH (ref 70–99)
Glucose-Capillary: 311 mg/dL — ABNORMAL HIGH (ref 70–99)
Glucose-Capillary: 308 mg/dL — ABNORMAL HIGH (ref 70–99)

## 2021-04-21 LAB — CBC
HCT: 27.3 % — ABNORMAL LOW (ref 39.0–52.0)
Hemoglobin: 8.7 g/dL — ABNORMAL LOW (ref 13.0–17.0)
MCH: 30.5 pg (ref 26.0–34.0)
MCHC: 31.9 g/dL (ref 30.0–36.0)
MCV: 95.8 fL (ref 80.0–100.0)
Platelets: 276 10*3/uL (ref 150–400)
RBC: 2.85 MIL/uL — ABNORMAL LOW (ref 4.22–5.81)
RDW: 14.2 % (ref 11.5–15.5)
WBC: 10.8 10*3/uL — ABNORMAL HIGH (ref 4.0–10.5)
nRBC: 0 % (ref 0.0–0.2)

## 2021-04-21 LAB — COMPREHENSIVE METABOLIC PANEL
ALT: 24 U/L (ref 0–44)
AST: 20 U/L (ref 15–41)
Albumin: 2.4 g/dL — ABNORMAL LOW (ref 3.5–5.0)
Alkaline Phosphatase: 73 U/L (ref 38–126)
Anion gap: 10 (ref 5–15)
BUN: 104 mg/dL — ABNORMAL HIGH (ref 6–20)
CO2: 20 mmol/L — ABNORMAL LOW (ref 22–32)
Calcium: 7.5 mg/dL — ABNORMAL LOW (ref 8.9–10.3)
Chloride: 107 mmol/L (ref 98–111)
Creatinine, Ser: 2.55 mg/dL — ABNORMAL HIGH (ref 0.61–1.24)
GFR, Estimated: 28 mL/min — ABNORMAL LOW (ref >60)
Glucose, Bld: 162 mg/dL — ABNORMAL HIGH (ref 70–99)
Potassium: 3.9 mmol/L (ref 3.5–5.1)
Sodium: 137 mmol/L (ref 135–145)
Total Bilirubin: 0.6 mg/dL (ref 0.3–1.2)
Total Protein: 6.3 g/dL — ABNORMAL LOW (ref 6.5–8.1)

## 2021-04-21 MED ORDER — SODIUM CHLORIDE 0.9 % IV SOLN
510.0000 mg | Freq: Once | INTRAVENOUS | Status: AC
  Administered 2021-04-21: 510 mg via INTRAVENOUS
  Filled 2021-04-21: qty 17

## 2021-04-21 MED ORDER — AMLODIPINE BESYLATE 5 MG PO TABS
10.0000 mg | ORAL_TABLET | Freq: Every day | ORAL | Status: DC
  Administered 2021-04-22 – 2021-05-01 (×10): 10 mg via ORAL
  Filled 2021-04-21 (×10): qty 2

## 2021-04-21 MED ORDER — BUMETANIDE 1 MG PO TABS
2.0000 mg | ORAL_TABLET | Freq: Two times a day (BID) | ORAL | Status: DC
  Administered 2021-04-21 – 2021-04-22 (×2): 2 mg via ORAL
  Filled 2021-04-21 (×2): qty 2

## 2021-04-21 MED ORDER — DARBEPOETIN ALFA 200 MCG/0.4ML IJ SOSY
200.0000 ug | PREFILLED_SYRINGE | Freq: Once | INTRAMUSCULAR | Status: AC
  Administered 2021-04-21: 200 ug via SUBCUTANEOUS
  Filled 2021-04-21: qty 0.4

## 2021-04-21 NOTE — Progress Notes (Signed)
Admit: 04/09/2021 LOS: 70  79M AoCKD3, CAP, HFpEF with acute exacerbaiton  Subjective:   UOP 1100  SCR and BUN cont to slowly improve.   -8.2L since admit BPs still up. On amlodipine -  increased to 10 today  Remains on high flow [place]  09/27 0701 - 09/28 0700 In: -  Out: 7628 [Urine:1150; Stool:1]  Filed Weights   04/18/21 0500 04/20/21 0500 04/21/21 0358  Weight: 107.1 kg 107.3 kg 106.7 kg    Scheduled Meds:  [START ON 04/22/2021] amLODipine  10 mg Oral Daily   aspirin EC  81 mg Oral Daily   budesonide (PULMICORT) nebulizer solution  0.5 mg Nebulization BID   bumetanide  1 mg Oral BID   buPROPion  100 mg Oral BID   carvedilol  25 mg Oral BID WC   Chlorhexidine Gluconate Cloth  6 each Topical Daily   cloNIDine  0.3 mg Oral TID   ferrous sulfate  325 mg Oral Q breakfast   heparin  5,000 Units Subcutaneous Q8H   hydrALAZINE  100 mg Oral Q8H   insulin aspart  0-5 Units Subcutaneous QHS   insulin aspart  0-9 Units Subcutaneous TID WC   insulin glargine-yfgn  40 Units Subcutaneous QHS   ipratropium-albuterol  3 mL Nebulization Q6H   isosorbide mononitrate  30 mg Oral Daily   mouth rinse  15 mL Mouth Rinse BID   methylPREDNISolone (SOLU-MEDROL) injection  40 mg Intravenous Q8H   pantoprazole  40 mg Oral Daily   potassium chloride  20 mEq Oral Daily   rosuvastatin  20 mg Oral Daily   Continuous Infusions:   PRN Meds:.acetaminophen **OR** acetaminophen, ipratropium-albuterol, labetalol, ondansetron **OR** ondansetron (ZOFRAN) IV, oxyCODONE, senna-docusate, traZODone  Current Labs: reviewed    Physical Exam:  Blood pressure (!) 181/89, pulse 74, temperature 98.7 F (37.1 C), temperature source Oral, resp. rate (!) 22, height 5\' 9"  (1.753 m), weight 106.7 kg, SpO2 96 %. NAD, sitting in chair, mild inc wob/inc RR Trace to 1+ LEE  RRR nl s1s2 Diminished in bases, clear otherwise NO rashes/lesions AAO x3 Nonfocal EOMI,  NCAT  A AoCKD3, slowly resolving, nonoliguric,  neg imaging at admission; has nephrologist in [place].   Hypoxic RF / CAP / BOOP, ABX and steroids per TRH/PCCM.  Improved, remains on 6L [place] HFpEF, vol status cont to slowly improve  HTN, not at goal-  steroids are not helping-  increase bumex and consider add back aldactone soon  Anemia, Hb stable 8s DM2, hyperglycemia, uncontrolled, per TRH HLD Hypokalemia,   P D/w PCCM and TRH,    Bumex  is 50% of his home dose prior to admission-  I think we should take back up to full dose  Cont to hold RAASi, SGLT2i although think I would add back aldactone soon-  this will help BP/volume and K and possibly allow to come off of clonidine Daily weights, Daily Renal Panel, Strict I/Os, Avoid nephrotoxins (NSAIDs, judicious IV Contrast)  Will give IV iron and treat with aranesp as well  Will follow along   Louis Meckel  04/21/2021, 10:23 AM  Recent Labs  Lab 04/16/21 0439 04/17/21 0649 04/19/21 0405 04/20/21 0500 04/21/21 0443  NA 133*   < > 135 137 137  K 4.0   < > 3.7 3.3* 3.9  CL 100   < > 104 106 107  CO2 19*   < > 22 21* 20*  GLUCOSE 191*   < > 206* 64* 162*  BUN 114*   < >  112* 104* 104*  CREATININE 3.84*   < > 3.09* 2.84* 2.55*  CALCIUM 7.3*   < > 7.3* 7.4* 7.5*  PHOS 5.6*  --   --   --   --    < > = values in this interval not displayed.   Recent Labs  Lab 04/18/21 0403 04/19/21 0405 04/21/21 0443  WBC 9.0 9.4 10.8*  HGB 8.4* 8.8* 8.7*  HCT 26.0* 27.5* 27.3*  MCV 94.5 93.5 95.8  PLT 251 267 276
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
## 2021-04-21 NOTE — Progress Notes (Signed)
PROGRESS NOTE  Keith Snow XQJ:[id number] DOB: 15-Aug-1960 DOA: 04/09/2021 PCP: System, Provider Not In   Brief History:  60 year old with history of DM2, HTN, MI, CHF comes to the ED with change in mental status.  Upon admission he was noted to have focal consolidation in the right lower lung, elevated BNP.  He was also hyperglycemic.  CT of the chest showed combination of severe bilateral bronchopneumonia and possible effusion.  It has been difficult to diurese him due to worsening renal function.  Nephrology and Pulm consulted for their input. Started on Broad Spectrum Abx and Steroids. Cr continues to increase slowly.    Assessment/Plan: Acute hypoxic respiratory failure requiring BiPAP and HiFlow - Multifactorial.  Combination of PNA, COPD and CHF exacerbation. -9/19 CT chest Severe b/l BronchoPNA with small b/l parapneumonic effusions.  -pt had been weaning down to 6L HFNC but on 9/27-->increase SOB and back on BiPAP -9/27 CXR personally reviewed-unchanged diffuse bilateral pulm infiltrates -Still requiring high flow nasal cannula supplementation with intermittent use of BiPAP.  Continue medications and treatment as specified below -Appreciate assistance by pulmonology service and cardiology service.   Community-acquired pneumonia. Aspiration PNA?  Concerns for BOOP - Pro-Cal 0.54>>0.34  -Cleared by speech and swallow for regular diet. - Currently on IV Zosyn 7/20>> - Bronchodilators scheduled and as needed.  I-S/flutter -Speech and swallow eval-regular diet -MRSA neg -9/26 discussed with Pulm, Dr. Alfonzo Beers to po steroids -patient has finished 7 days zosyn -Since 04/20/2021 following pulmonology service patient is steroids has been restarted through his pains at a higher dose. -Continue to follow response and further recommendations by pulmonology service.   Diabetes mellitus type 2, uncontrolled with hyperglycemia - 9/17 A1c-10.3 - Continue current dose of  long-acting insulin and continue sliding scale -Continue to follow CBGs -Modify carbohydrate diet has been recommended.   Acute on chronic congestive heart failure with preserved EF, grade 2 DD, class III -04/10/21 Echo 55-60%, grade 2 DD, moderate LVH - Lasix IV by nephrology>>transition to 1/2 home dose bumex -9/26 discussed with renal--Dr. Joelyn Oms -previously on Entresto, spironolactone which are held due to AKI -Continue to follow I's and O's -Continue low-sodium diet. -Patient expressed good urine output.   Acute kidney injury on CKD stage IIIa - Admission creatinine 1.8, 2.63 > 2.71 > 3.17 >3.49 >3.84>>3.04 -Currently producing about 1.5 L of urine over last 24 hours.   -BUN rising as well but this could also be secondary to steroids.  Potassium and bicarb are relatively stable.   -Renal ultrasound is negative for acute pathology.   -appreciate renal follow up and recommendations. -he does not look to be florridly fluid overloaded -previously on Bumex 2 mg bid when he lived in [place] improved with improving renal function--restarted half home dose Bumex on 9/26 -Volume status has remained stable -Continue the use of current dose of Bumex.   Acute metabolic encephalopathy -Resolved.  -Mentation back to baseline. -Patient is oriented x3 and following commands appropriately.   Essential hypertension - Continue the use of Coreg,  hydralazine, clonidine, imdur and amlodipine. -nifedipine stopped due to concerns for shunt -Continue to follow vital signs and further adjust antihypertensive treatment as needed  Hyperlipidemia - Continue the use of Crestor        Status is: Inpatient   Remains inpatient appropriate because:Inpatient level of care appropriate due to severity of illness   Dispo: The patient is from: Home  Anticipated d/c is to: Home              Patient currently is not medically stable to d/c.              Difficult to place patient No           Family Communication:   No family at bedside.   Consultants:  pulm   Code Status:  FULL    DVT Prophylaxis:  [place] Heparin     Procedures: As Listed in Progress Note Above   Antibiotics: Zosyn 9/20>>9/27     Subjective: Patient reports shortness of breath with activity; no chest pain, no nausea, no vomiting.  After adjustment on his his steroids yesterday having able to be weaned off BiPAP but still requiring high flow nasal cannula supplementation.  Objective: Vitals:   04/21/21 1636 04/21/21 1700 04/21/21 1800 04/21/21 1824  BP:  (!) 186/83 (!) 158/86   Pulse:      Resp: 18 (!) 27 19   Temp: 97.7 F (36.5 C)     TempSrc: Oral     SpO2:    100%  Weight:      Height:        Intake/Output Summary (Last 24 hours) at 04/21/2021 1916 Last data filed at 04/21/2021 1501 Gross per 24 hour  Intake 597 ml  Output 1150 ml  Net -553 ml   Weight change: -0.6 kg  Exam: General exam: Alert, awake, oriented x 3; following commands appropriately; reporting some improvement in his respiratory status.  Still requiring intermittent use of BiPAP and high flow nasal cannula around 7-10 L. Respiratory system: Positive scattered rhonchi; decreased breath sounds at the bases.  No using accessory muscle.  Positive tachypnea on exertion. Cardiovascular system:RRR. No murmurs, rubs, gallops.  No JVD. Gastrointestinal system: Abdomen is nondistended, soft and nontender. No organomegaly or masses felt. Normal bowel sounds heard. Central nervous system: Alert and oriented. No focal neurological deficits. Extremities: No cyanosis or clubbing; trace edema bilaterally appreciated. Skin: No rashes, no petechiae. Psychiatry: Judgement and insight appear normal. Mood & affect appropriate.    Data Reviewed: I have personally reviewed following labs and imaging studies  Basic Metabolic Panel: Recent Labs  Lab 04/15/21 0459 04/16/21 0439 04/17/21 0649 04/18/21 0403 04/19/21 0405 04/20/21 0500  04/21/21 0443  NA 134* 133* 135 137 135 137 137  K 4.2 4.0 3.7 3.6 3.7 3.3* 3.9  CL 102 100 103 104 104 106 107  CO2 19* 19* 20* 24 22 21* 20*  GLUCOSE 245* 191* 237* 124* 206* 64* 162*  BUN 97* 114* 126* 111* 112* 104* 104*  CREATININE 3.49* 3.84* 3.29* 3.04* 3.09* 2.84* 2.55*  CALCIUM 7.6* 7.3* 7.4* 7.2* 7.3* 7.4* 7.5*  MG 2.8* 2.8* 2.9*  --   --   --   --   PHOS  --  5.6*  --   --   --   --   --    Liver Function Tests: Recent Labs  Lab 04/15/21 0459 04/16/21 0439 04/18/21 0403 04/19/21 0405 04/21/21 0443  AST 29  --  11* 11* 20  ALT 24  --  17 16 24   ALKPHOS 129*  --  75 65 73  BILITOT 0.7  --  0.4 0.7 0.6  PROT 6.6  --  6.0* 6.2* 6.3*  ALBUMIN 2.5* 2.5* 2.2* 2.3* 2.4*   CBC: Recent Labs  Lab 04/16/21 0439 04/17/21 0649 04/18/21 0403 04/19/21 0405 04/21/21 0443  WBC 8.6 8.6 9.0  9.4 10.8*  HGB 8.6* 8.8* 8.4* 8.8* 8.7*  HCT 27.0* 27.5* 26.0* 27.5* 27.3*  MCV 94.7 94.2 94.5 93.5 95.8  PLT 211 254 251 267 276    CBG: Recent Labs  Lab 04/20/21 1647 04/20/21 2108 04/21/21 0710 04/21/21 1150 04/21/21 1635  GLUCAP 148* 148* 154* 266* 311*   Urine analysis:    Component Value Date/Time   COLORURINE YELLOW 04/14/2021 1803   APPEARANCEUR CLEAR 04/14/2021 1803   LABSPEC >1.030 (H) 04/14/2021 1803   PHURINE 5.0 04/14/2021 1803   GLUCOSEU NEGATIVE 04/14/2021 1803   HGBUR NEGATIVE 04/14/2021 1803   BILIRUBINUR NEGATIVE 04/14/2021 1803   KETONESUR NEGATIVE 04/14/2021 1803   PROTEINUR TRACE (A) 04/14/2021 1803   NITRITE NEGATIVE 04/14/2021 1803   LEUKOCYTESUR NEGATIVE 04/14/2021 1803   Recent Results (from the past 240 hour(s))  Expectorated Sputum Assessment w Gram Stain, Rflx to Resp Cult     Status: None   Collection Time: 04/16/21  7:00 PM   Specimen: Sputum  Result Value Ref Range Status   Specimen Description SPUTUM  Final   Special Requests NONE  Final   Sputum evaluation   Final    THIS SPECIMEN IS ACCEPTABLE FOR SPUTUM CULTURE PERFORMED AT  [facility] Performed at [facility], [address] [place], [place] [address]    Report Status 04/16/2021 FINAL  Final  Culture, Respiratory w Gram Stain     Status: None   Collection Time: 04/16/21  7:00 PM   Specimen: SPU  Result Value Ref Range Status   Specimen Description   Final    SPUTUM Performed at [facility], [address], [place], [place] [address]    Special Requests   Final    NONE Reflexed from [id number] Performed at [facility], [address] [place], [place] [address]    Gram Stain   Final    NO WBC SEEN RARE SQUAMOUS EPITHELIAL CELLS PRESENT RARE GRAM POSITIVE COCCI RARE YEAST    Culture   Final    FEW Normal respiratory flora-no Staph aureus or Pseudomonas seen Performed at [facility], [place] [address], [place], [place] [address]    Report Status 04/19/2021 FINAL  Final     Scheduled Meds:  [START ON 04/22/2021] amLODipine  10 mg Oral Daily   aspirin EC  81 mg Oral Daily   budesonide (PULMICORT) nebulizer solution  0.5 mg Nebulization BID   bumetanide  2 mg Oral BID   buPROPion  100 mg Oral BID   carvedilol  25 mg Oral BID WC   Chlorhexidine Gluconate Cloth  6 each Topical Daily   cloNIDine  0.3 mg Oral TID   ferrous sulfate  325 mg Oral Q breakfast   heparin  5,000 Units Subcutaneous Q8H   hydrALAZINE  100 mg Oral Q8H   insulin aspart  0-5 Units Subcutaneous QHS   insulin aspart  0-9 Units Subcutaneous TID WC   insulin glargine-yfgn  40 Units Subcutaneous QHS   ipratropium-albuterol  3 mL Nebulization Q6H   isosorbide mononitrate  30 mg Oral Daily   mouth rinse  15 mL Mouth Rinse BID   methylPREDNISolone (SOLU-MEDROL) injection  40 mg Intravenous Q8H   pantoprazole  40 mg Oral Daily   potassium chloride  20 mEq Oral Daily   rosuvastatin  20 mg Oral Daily   Continuous Infusions:  Procedures/Studies: CT CHEST WO CONTRAST  Result Date: 04/12/2021 CLINICAL DATA:  60 year old male with history of acute hypoxic respiratory failure requiring  BiPAP.  EXAM: CT CHEST WITHOUT CONTRAST TECHNIQUE: Multidetector CT imaging of the chest was performed following the standard protocol without IV contrast. COMPARISON:  No priors. FINDINGS: Cardiovascular: Heart size is normal. There is no significant pericardial fluid, thickening or pericardial calcification. There is aortic atherosclerosis, as well as atherosclerosis of the great vessels of the mediastinum and the coronary arteries, including calcified atherosclerotic plaque in the left anterior descending and left circumflex coronary arteries. Mediastinum/Nodes: Multiple prominent borderline enlarged mediastinal and bilateral hilar lymph nodes are noted, nonspecific and likely reactive. Esophagus is unremarkable in appearance. No axillary lymphadenopathy. Lungs/Pleura: Small bilateral pleural effusions (right greater than left) lying dependently. Patchy multifocal airspace consolidation most evident in a peribronchovascular distribution with surrounding ground-glass attenuation and septal thickening. Upper Abdomen: Unremarkable. Musculoskeletal: There are no aggressive appearing lytic or blastic lesions noted in the visualized portions of the skeleton. Multiple old healed rib fractures. IMPRESSION: 1. Severe multilobar bilateral bronchopneumonia with small bilateral parapneumonic pleural effusions lying dependently, as above. 2. Aortic atherosclerosis, in addition to 2 vessel coronary artery disease. Please note that although the presence of coronary artery calcium documents the presence of coronary artery disease, the severity of this disease and any potential stenosis cannot be assessed on this non-gated CT examination. Assessment for potential risk factor modification, dietary therapy or pharmacologic therapy may be warranted, if clinically indicated. Aortic Atherosclerosis (ICD10-I70.0). Electronically Signed   By: Vinnie Langton M.D.   On: 04/12/2021 18:58   US RENAL  Result Date: 04/14/2021 CLINICAL  DATA:  Acute renal injury EXAM: RENAL / URINARY TRACT ULTRASOUND COMPLETE COMPARISON:  None. FINDINGS: Right Kidney: Renal measurements: 12.3 x 4.5 x 5.7 cm. = volume: 165 mL. Echogenicity within normal limits. No mass or hydronephrosis visualized. Left Kidney: Renal measurements: 10.9 x 6.0 x 4.9 cm. = volume: 66 mL. Echogenicity within normal limits. No mass or hydronephrosis visualized. Bladder: Bladder is well distended. Mild wall thickening is noted anteriorly measuring up to 9 mm. This could be related to underlying UTI. Clinical correlation is recommended. Other: None. IMPRESSION: Normal-appearing kidneys bilaterally. Mild bladder wall thickening which may be inflammatory in nature. Clinical correlation is recommended. Electronically Signed   By: Inez Catalina M.D.   On: 04/14/2021 15:54   DG CHEST PORT 1 VIEW  Result Date: 04/21/2021 CLINICAL DATA:  Hypoxia EXAM: PORTABLE CHEST 1 VIEW COMPARISON:  Chest x-ray dated April 20, 2021 FINDINGS: Unchanged cardiomegaly. Bilateral heterogeneous opacities with slightly more focal appearance the right mid lung, likely due to superimposed atelectasis. Small bilateral pleural effusions. No evidence of pneumothorax. IMPRESSION: Similar bilateral heterogeneous pulmonary opacities and small bilateral pleural effusions. Electronically Signed   By: Yetta Glassman M.D.   On: 04/21/2021 08:11   DG Chest Port 1 View  Result Date: 04/20/2021 CLINICAL DATA:  Acute respiratory failure EXAM: PORTABLE CHEST 1 VIEW COMPARISON:  04/14/2021 FINDINGS: Bilateral airspace disease again noted, unchanged. Heart is mildly enlarged. Possible small layering left effusion. Scattered aortic calcifications. No acute bony abnormality. IMPRESSION: Diffuse bilateral airspace disease, unchanged. Suspect layering left effusion. Electronically Signed   By: Rolm Baptise M.D.   On: 04/20/2021 08:02   DG Chest Port 1 View  Result Date: 04/14/2021 CLINICAL DATA:  Shortness of breath,  history of stroke, diabetes mellitus, hypertension, MI, cancer EXAM: PORTABLE CHEST 1 VIEW COMPARISON:  Portable exam 1340 hours compared to 04/10/2021 FINDINGS: Enlargement of cardiac silhouette with pulmonary vascular congestion. Stable mediastinal contours. BILATERAL pulmonary infiltrates question pulmonary edema, slightly greater at RIGHT base. Small BILATERAL pleural  effusions. No pneumothorax or acute osseous findings. IMPRESSION: Enlargement of cardiac silhouette with pulmonary vascular congestion and mild pulmonary edema. Small BILATERAL pleural effusions. Electronically Signed   By: Lavonia Dana M.D.   On: 04/14/2021 16:01   DG CHEST PORT 1 VIEW  Result Date: 04/10/2021 CLINICAL DATA:  Weakness and fatigue. EXAM: PORTABLE CHEST 1 VIEW COMPARISON:  04/09/2021 FINDINGS: 0657 hours. The cardio pericardial silhouette is enlarged. Focal airspace disease again noted right base with some probable atelectasis or infiltrate in the retrocardiac left base. There is pulmonary vascular congestion without overt pulmonary edema. Superimposed component of interstitial pulmonary edema suspected. IMPRESSION: Asymmetric focal airspace disease right base with some probable atelectasis or infiltrate in the retrocardiac left base. Interstitial edema not excluded. Electronically Signed   By: Misty Stanley M.D.   On: 04/10/2021 07:33   DG Chest Port 1 View  Result Date: 04/09/2021 CLINICAL DATA:  Fever and weakness EXAM: PORTABLE CHEST 1 VIEW COMPARISON:  None. FINDINGS: The heart and mediastinal contours are within normal limits. Aortic calcification. Right lower lung zone focal consolidation. Question retrocardiac opacity. No pulmonary edema. Possible trace left pleural effusion. No right pleural effusion. No pneumothorax. No acute osseous abnormality. IMPRESSION: Right lower lung zone focal consolidation. Question retrocardiac opacity with possible trace left pleural effusion. Findings consistent with  infection/inflammation. Followup PA and lateral chest X-ray is recommended in 3-4 weeks following therapy to ensure resolution and exclude underlying malignancy. Electronically Signed   By: Iven Finn M.D.   On: 04/09/2021 23:59   ECHOCARDIOGRAM COMPLETE  Result Date: 04/10/2021    ECHOCARDIOGRAM REPORT   Patient Name:   UZOMA Mednick Date of Exam: 04/10/2021 Medical Rec #:  [id number]    Height:       69.0 in Accession #:    [id number]   Weight:       217.0 lb Date of Birth:  06-Jul-1961    BSA:          2.139 m Patient Age:    66 years     BP:           142/83 mmHg Patient Gender: M            HR:           85 bpm. Exam Location:  [facility] Procedure: 2D Echo, Cardiac Doppler and Color Doppler Indications:    SBE I33.9  History:        Patient has no prior history of Echocardiogram examinations.                 Previous Myocardial Infarction, Stroke; Risk                 Factors:Hypertension and Diabetes. Hx of cancer. 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  Sonographer:    Alvino Chapel RCS Referring Phys: [place]  1. Left ventricular ejection fraction, by estimation, is 55 to 60%. The left ventricle has normal function. The left ventricle has no regional wall motion abnormalities. There is moderate concentric left ventricular hypertrophy. Left ventricular diastolic parameters are consistent with Grade II diastolic dysfunction (pseudonormalization).  2. Right ventricular systolic function is normal. The right ventricular size is normal. There is normal pulmonary artery systolic pressure. The estimated right ventricular systolic pressure is 82.9 mmHg.  3. Left atrial size was moderately dilated.  4. Right atrial size was mildly dilated.  5. The mitral valve is grossly normal. No evidence of mitral valve regurgitation. No evidence of mitral stenosis.  6. The aortic valve is tricuspid. Aortic valve regurgitation is not visualized. No aortic stenosis is  present.  7. The inferior vena cava is normal in size with greater than 50% respiratory variability, suggesting right atrial pressure of 3 mmHg. FINDINGS  Left Ventricle: Left ventricular ejection fraction, by estimation, is 55 to 60%. The left ventricle has normal function. The left ventricle has no regional wall motion abnormalities. The left ventricular internal cavity size was normal in size. There is  moderate concentric left ventricular hypertrophy. Left ventricular diastolic parameters are consistent with Grade II diastolic dysfunction (pseudonormalization). Right Ventricle: The right ventricular size is normal. No increase in right ventricular wall thickness. Right ventricular systolic function is normal. There is normal pulmonary artery systolic pressure. The tricuspid regurgitant velocity is 2.10 m/s, and  with an assumed right atrial pressure of 3 mmHg, the estimated right ventricular systolic pressure is 05.3 mmHg. Left Atrium: Left atrial size was moderately dilated. Right Atrium: Right atrial size was mildly dilated. Pericardium: There is no evidence of pericardial effusion. Mitral Valve: The mitral valve is grossly normal. No evidence of mitral valve regurgitation. No evidence of mitral valve stenosis. Tricuspid Valve: The tricuspid valve is grossly normal. Tricuspid valve regurgitation is trivial. No evidence of tricuspid stenosis. Aortic Valve: The aortic valve is tricuspid. Aortic valve regurgitation is not visualized. No aortic stenosis is present. Pulmonic Valve: The pulmonic valve was grossly normal. Pulmonic valve regurgitation is not visualized. No evidence of pulmonic stenosis. Aorta: The aortic root is normal in size and structure. Venous: The inferior vena cava is normal in size with greater than 50% respiratory variability, suggesting right atrial pressure of 3 mmHg. IAS/Shunts: The atrial septum is grossly normal.  LEFT VENTRICLE PLAX 2D LVIDd:         4.70 cm  Diastology LVIDs:          3.10 cm  LV e' medial:    6.74 cm/s LV PW:         1.50 cm  LV E/e' medial:  16.5 LV IVS:        1.50 cm  LV e' lateral:   9.14 cm/s LVOT diam:     2.00 cm  LV E/e' lateral: 12.1 LV SV:         65 LV SV Index:   31 LVOT Area:     3.14 cm  RIGHT VENTRICLE RV S prime:     13.80 cm/s TAPSE (M-mode): 2.6 cm LEFT ATRIUM             Index       RIGHT ATRIUM           Index LA diam:        4.20 cm 1.96 cm/m  RA Area:     24.30 cm LA Vol (A2C):   79.4 ml 37.12 ml/m RA Volume:   81.40 ml  38.06 ml/m LA Vol (A4C):   93.6 ml 43.76 ml/m LA Biplane Vol: 86.2 ml 40.30 ml/m  AORTIC VALVE LVOT Vmax:   102.00 cm/s LVOT Vmean:  62.500 cm/s LVOT VTI:    0.208 m  AORTA Ao Root diam: 3.60 cm MITRAL VALVE                TRICUSPID VALVE MV Area (PHT): 4.15 cm     TR Peak grad:   17.6 mmHg MV Decel Time: 183 msec  TR Vmax:        210.00 cm/s MV E velocity: 111.00 cm/s MV A velocity: 79.70 cm/s   SHUNTS MV E/A ratio:  1.39         Systemic VTI:  0.21 m                             Systemic Diam: 2.00 cm Eleonore Chiquito MD Electronically signed by Eleonore Chiquito MD Signature Date/Time: 04/10/2021/1:25:55 PM    Final    [place] EKG SITE RITE  Result Date: 04/17/2021 If Site Rite image not attached, placement could not be confirmed due to current cardiac rhythm.   Barton Dubois, MD  [facility]  If 7PM-7AM, please contact night-coverage [email or web address] Password [facility] 04/21/2021, 7:16 PM   LOS: 11 days

## 2021-04-21 NOTE — Progress Notes (Signed)
Shortly after dinner pt complained that he felt like he was choking. He stated it feels like "something is stuck" and pointd to mid chest. O2 saturation was in the mid- upper 80's on 7 liters. Increased oxygen to 10 liters and saturation increased to mid 90's. No changes in lung sounds from previous assessment. RT called and came to bedside to assess pt and give Duoneb. Asked pt if he felt like breathing treatment helped and he said "not really". Dr. Dyann Kief notified. Chest x-ray ordered.

## 2021-04-21 NOTE — Progress Notes (Signed)
Progress Note  Patient Name: Keith Snow Date of Encounter: 04/21/2021  Primary Cardiologist: Gwen Pounds, MD ([place])  Subjective   Sitting in bedside chair.  Reports no chest pain or breathlessness at rest.  Generally fatigued.  Inpatient Medications    Scheduled Meds:  amLODipine  5 mg Oral Daily   aspirin EC  81 mg Oral Daily   budesonide (PULMICORT) nebulizer solution  0.5 mg Nebulization BID   bumetanide  1 mg Oral BID   buPROPion  100 mg Oral BID   carvedilol  25 mg Oral BID WC   Chlorhexidine Gluconate Cloth  6 each Topical Daily   cloNIDine  0.3 mg Oral TID   ferrous sulfate  325 mg Oral Q breakfast   heparin  5,000 Units Subcutaneous Q8H   hydrALAZINE  100 mg Oral Q8H   insulin aspart  0-5 Units Subcutaneous QHS   insulin aspart  0-9 Units Subcutaneous TID WC   insulin glargine-yfgn  40 Units Subcutaneous QHS   ipratropium-albuterol  3 mL Nebulization Q6H   isosorbide mononitrate  30 mg Oral Daily   mouth rinse  15 mL Mouth Rinse BID   methylPREDNISolone (SOLU-MEDROL) injection  40 mg Intravenous Q8H   pantoprazole  40 mg Oral Daily   potassium chloride  20 mEq Oral Daily   rosuvastatin  20 mg Oral Daily    PRN Meds: acetaminophen **OR** acetaminophen, ipratropium-albuterol, labetalol, ondansetron **OR** ondansetron (ZOFRAN) IV, oxyCODONE, senna-docusate, traZODone   Vital Signs    Vitals:   04/21/21 0708 04/21/21 0800 04/21/21 0806 04/21/21 0836  BP:  (!) 173/95    Pulse:      Resp: (!) 27 20  (!) 21  Temp: 98.7 F (37.1 C)     TempSrc: Oral     SpO2:   96%   Weight:      Height:        Intake/Output Summary (Last 24 hours) at 04/21/2021 0930 Last data filed at 04/21/2021 0800 Gross per 24 hour  Intake --  Output 1750 ml  Net -1750 ml   Filed Weights   04/18/21 0500 04/20/21 0500 04/21/21 0358  Weight: 107.1 kg 107.3 kg 106.7 kg    Telemetry    Sinus rhythm.  Personally reviewed.  ECG    An ECG dated 04/10/2021 was  personally reviewed today and demonstrated:  Sinus rhythm with left atrial enlargement, nonspecific ST changes.  Physical Exam   GEN: No acute distress.   Neck: No JVD. Cardiac: RRR without gallop.  Respiratory: Nonlabored.  Decreased breath sounds at the bases. GI: Soft, nontender, bowel sounds present. MS: No pitting edema; No deformity. Neuro:  Nonfocal. Psych: Alert and oriented x 3. Normal affect.  Labs    Chemistry Recent Labs  Lab 04/18/21 0403 04/19/21 0405 04/20/21 0500 04/21/21 0443  NA 137 135 137 137  K 3.6 3.7 3.3* 3.9  CL 104 104 106 107  CO2 24 22 21* 20*  GLUCOSE 124* 206* 64* 162*  BUN 111* 112* 104* 104*  CREATININE 3.04* 3.09* 2.84* 2.55*  CALCIUM 7.2* 7.3* 7.4* 7.5*  PROT 6.0* 6.2*  --  6.3*  ALBUMIN 2.2* 2.3*  --  2.4*  AST 11* 11*  --  20  ALT 17 16  --  24  ALKPHOS 75 65  --  73  BILITOT 0.4 0.7  --  0.6  GFRNONAA 23* 23* 25* 28*  ANIONGAP 9 9 10 10      Hematology Recent Labs  Lab  04/18/21 0403 04/19/21 0405 04/21/21 0443  WBC 9.0 9.4 10.8*  RBC 2.75* 2.94* 2.85*  HGB 8.4* 8.8* 8.7*  HCT 26.0* 27.5* 27.3*  MCV 94.5 93.5 95.8  MCH 30.5 29.9 30.5  MCHC 32.3 32.0 31.9  RDW 13.5 13.7 14.2  PLT 251 267 276    Cardiac Enzymes Recent Labs  Lab 04/10/21 0009 04/10/21 0215  TROPONINIHS 91* 87*    BNP Recent Labs  Lab 04/15/21 0459 04/16/21 0439  BNP 1,390.0* 1,341.0*     Radiology    DG CHEST PORT 1 VIEW  Result Date: 04/21/2021 CLINICAL DATA:  Hypoxia EXAM: PORTABLE CHEST 1 VIEW COMPARISON:  Chest x-ray dated April 20, 2021 FINDINGS: Unchanged cardiomegaly. Bilateral heterogeneous opacities with slightly more focal appearance the right mid lung, likely due to superimposed atelectasis. Small bilateral pleural effusions. No evidence of pneumothorax. IMPRESSION: Similar bilateral heterogeneous pulmonary opacities and small bilateral pleural effusions. Electronically Signed   By: Yetta Glassman M.D.   On: 04/21/2021 08:11    DG Chest Port 1 View  Result Date: 04/20/2021 CLINICAL DATA:  Acute respiratory failure EXAM: PORTABLE CHEST 1 VIEW COMPARISON:  04/14/2021 FINDINGS: Bilateral airspace disease again noted, unchanged. Heart is mildly enlarged. Possible small layering left effusion. Scattered aortic calcifications. No acute bony abnormality. IMPRESSION: Diffuse bilateral airspace disease, unchanged. Suspect layering left effusion. Electronically Signed   By: Rolm Baptise M.D.   On: 04/20/2021 08:02    Cardiac Studies   Echocardiogram 04/10/2021:  1. Left ventricular ejection fraction, by estimation, is 55 to 60%. The  left ventricle has normal function. The left ventricle has no regional  wall motion abnormalities. There is moderate concentric left ventricular  hypertrophy. Left ventricular  diastolic parameters are consistent with Grade II diastolic dysfunction  (pseudonormalization).   2. Right ventricular systolic function is normal. The right ventricular  size is normal. There is normal pulmonary artery systolic pressure. The  estimated right ventricular systolic pressure is 49.7 mmHg.   3. Left atrial size was moderately dilated.   4. Right atrial size was mildly dilated.   5. The mitral valve is grossly normal. No evidence of mitral valve  regurgitation. No evidence of mitral stenosis.   6. The aortic valve is tricuspid. Aortic valve regurgitation is not  visualized. No aortic stenosis is present.   7. The inferior vena cava is normal in size with greater than 50%  respiratory variability, suggesting right atrial pressure of 3 mmHg.   Assessment & Plan    1.  Acute on chronic diastolic heart failure, LVEF 55 to 60% with moderate LVH, moderate diastolic dysfunction, and normal RV contraction as well as estimated RVSP.  Outside records from his primary cardiologist indicate negative PYP scan for TTR amyloidosis.  He has been managed medically.  Taken off Entresto and spironolactone in light of AKI.   Approximately 1200 net output more than intake last 24 hours currently on oral Bumex.  2.  AKI on CKD stage IIIb.  Baseline creatinine 1.7-2.1, currently down to 2.55.  Nephrology is following.  3.  Uncontrolled type 2 diabetes mellitus.  Hemoglobin A1c 10.3%.  Presently on insulin, had been on Jardiance as an outpatient per records.  4.  Essential hypertension, blood pressure control is not optimal.  Norvasc has been added and will be uptitrated.  5.  Mixed hyperlipidemia, on Crestor.  Continue Coreg, hydralazine, Imdur, KCl and Bumex.  Norvasc will be uptitrated to 10 mg daily.  He is not a candidate for ARB, ANRI,  Aldactone, or SGLT2 inhibitor at this time in light of his current renal insufficiency, although he has continued to show improvement.  Signed, Rozann Lesches, MD  04/21/2021, 9:30 AM

## 2021-04-22 DIAGNOSIS — N179 Acute kidney failure, unspecified: Secondary | ICD-10-CM | POA: Diagnosis not present

## 2021-04-22 DIAGNOSIS — I5033 Acute on chronic diastolic (congestive) heart failure: Secondary | ICD-10-CM | POA: Diagnosis not present

## 2021-04-22 DIAGNOSIS — N1832 Chronic kidney disease, stage 3b: Secondary | ICD-10-CM | POA: Diagnosis not present

## 2021-04-22 DIAGNOSIS — J9601 Acute respiratory failure with hypoxia: Secondary | ICD-10-CM | POA: Diagnosis not present

## 2021-04-22 LAB — RENAL FUNCTION PANEL
Albumin: 2.4 g/dL — ABNORMAL LOW (ref 3.5–5.0)
Anion gap: 11 (ref 5–15)
BUN: 108 mg/dL — ABNORMAL HIGH (ref 6–20)
CO2: 21 mmol/L — ABNORMAL LOW (ref 22–32)
Calcium: 7.5 mg/dL — ABNORMAL LOW (ref 8.9–10.3)
Chloride: 105 mmol/L (ref 98–111)
Creatinine, Ser: 2.57 mg/dL — ABNORMAL HIGH (ref 0.61–1.24)
GFR, Estimated: 28 mL/min — ABNORMAL LOW (ref >60)
Glucose, Bld: 222 mg/dL — ABNORMAL HIGH (ref 70–99)
Phosphorus: 4.8 mg/dL — ABNORMAL HIGH (ref 2.5–4.6)
Potassium: 4.1 mmol/L (ref 3.5–5.1)
Sodium: 137 mmol/L (ref 135–145)

## 2021-04-22 LAB — GLUCOSE, CAPILLARY
Glucose-Capillary: 189 mg/dL — ABNORMAL HIGH (ref 70–99)
Glucose-Capillary: 295 mg/dL — ABNORMAL HIGH (ref 70–99)
Glucose-Capillary: 343 mg/dL — ABNORMAL HIGH (ref 70–99)
Glucose-Capillary: 344 mg/dL — ABNORMAL HIGH (ref 70–99)

## 2021-04-22 MED ORDER — ISOSORBIDE MONONITRATE ER 60 MG PO TB24
30.0000 mg | ORAL_TABLET | Freq: Two times a day (BID) | ORAL | Status: DC
  Administered 2021-04-22 – 2021-05-01 (×19): 30 mg via ORAL
  Filled 2021-04-22 (×19): qty 1

## 2021-04-22 MED ORDER — BUMETANIDE 1 MG PO TABS
3.0000 mg | ORAL_TABLET | Freq: Two times a day (BID) | ORAL | Status: DC
  Administered 2021-04-22 – 2021-04-30 (×16): 3 mg via ORAL
  Filled 2021-04-22 (×17): qty 3

## 2021-04-22 MED ORDER — INSULIN ASPART 100 UNIT/ML IJ SOLN
3.0000 [IU] | Freq: Three times a day (TID) | INTRAMUSCULAR | Status: DC
  Administered 2021-04-23 – 2021-04-24 (×4): 3 [IU] via SUBCUTANEOUS

## 2021-04-22 MED ORDER — SPIRONOLACTONE 25 MG PO TABS
25.0000 mg | ORAL_TABLET | Freq: Every day | ORAL | Status: DC
  Administered 2021-04-22 – 2021-04-26 (×5): 25 mg via ORAL
  Filled 2021-04-22 (×5): qty 1
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
## 2021-04-22 NOTE — Progress Notes (Signed)
Admit: 04/09/2021 LOS: 12  45M AoCKD3, CAP, HFpEF with acute exacerbaiton  Subjective:   UOP 1400  SCR and BUN stalled   -9L since admit BPs still up. On amlodipine -  increased to 10 on 9/28 Remains on high flow [place]-  had CXR yest due to more SOB-  airspace opacities still-  edema vs infection  09/28 0701 - 09/29 0700 In: 597 [P.O.:480; IV Piggyback:117] Out: 1400 [Urine:1400]  Filed Weights   04/18/21 0500 04/20/21 0500 04/21/21 0358  Weight: 107.1 kg 107.3 kg 106.7 kg    Scheduled Meds:  amLODipine  10 mg Oral Daily   aspirin EC  81 mg Oral Daily   budesonide (PULMICORT) nebulizer solution  0.5 mg Nebulization BID   bumetanide  2 mg Oral BID   buPROPion  100 mg Oral BID   carvedilol  25 mg Oral BID WC   Chlorhexidine Gluconate Cloth  6 each Topical Daily   cloNIDine  0.3 mg Oral TID   ferrous sulfate  325 mg Oral Q breakfast   heparin  5,000 Units Subcutaneous Q8H   hydrALAZINE  100 mg Oral Q8H   insulin aspart  0-5 Units Subcutaneous QHS   insulin aspart  0-9 Units Subcutaneous TID WC   insulin glargine-yfgn  40 Units Subcutaneous QHS   ipratropium-albuterol  3 mL Nebulization Q6H   isosorbide mononitrate  30 mg Oral Daily   mouth rinse  15 mL Mouth Rinse BID   methylPREDNISolone (SOLU-MEDROL) injection  40 mg Intravenous Q8H   pantoprazole  40 mg Oral Daily   potassium chloride  20 mEq Oral Daily   rosuvastatin  20 mg Oral Daily   Continuous Infusions:   PRN Meds:.acetaminophen **OR** acetaminophen, ipratropium-albuterol, labetalol, ondansetron **OR** ondansetron (ZOFRAN) IV, oxyCODONE, senna-docusate, traZODone  Current Labs: reviewed    Physical Exam:  Blood pressure (!) 176/89, pulse 64, temperature (!) 97.5 F (36.4 C), temperature source Oral, resp. rate 16, height 5\' 9"  (1.753 m), weight 106.7 kg, SpO2 98 %. NAD, sitting in chair, mild inc wob/inc RR Trace to 1+ LEE  RRR nl s1s2 Diminished in bases, clear otherwise NO rashes/lesions AAO  x3 Nonfocal EOMI,  NCAT  A AoCKD3, slowly resolving, nonoliguric, neg imaging at admission; has nephrologist in [place].  BL crt 1.7-2-3 Hypoxic RF / CAP / BOOP, ABX and steroids per TRH/PCCM.  Improved ?   remains on at least 6L [place] HFpEF, vol status cont to slowly improve  HTN, not at goal-  steroids are not helping-  increase bumex again and will add back aldactone today Anemia, Hb stable 8s DM2, hyperglycemia, uncontrolled, per TRH HLD Hypokalemia,   P D/w PCCM and TRH,    Bumex  was 50% of his home dose prior to admission-  taken back up to full dose on 9/28-  will inc to 3 BID today  Cont to hold RAASi, SGLT2i although think I would add back aldactone soon-  this will help BP/volume and K and possibly allow to come off of clonidine Daily weights, Daily Renal Panel, Strict I/Os, Avoid nephrotoxins (NSAIDs, judicious IV Contrast)  Will give IV iron- gave first dose on Wed and treat with aranesp as well -  200 given Mirna Mires  04/22/2021, 8:45 AM  Recent Labs  Lab 04/16/21 0439 04/17/21 0649 04/20/21 0500 04/21/21 0443 04/22/21 0449  NA 133*   < > 137 137 137  K 4.0   < > 3.3* 3.9 4.1  CL 100   < >  106 107 105  CO2 19*   < > 21* 20* 21*  GLUCOSE 191*   < > 64* 162* 222*  BUN 114*   < > 104* 104* 108*  CREATININE 3.84*   < > 2.84* 2.55* 2.57*  CALCIUM 7.3*   < > 7.4* 7.5* 7.5*  PHOS 5.6*  --   --   --  4.8*   < > = values in this interval not displayed.   Recent Labs  Lab 04/18/21 0403 04/19/21 0405 04/21/21 0443  WBC 9.0 9.4 10.8*  HGB 8.4* 8.8* 8.7*  HCT 26.0* 27.5* 27.3*  MCV 94.5 93.5 95.8  PLT 251 267 276

## 2021-04-22 NOTE — Progress Notes (Signed)
Results for KERRON, SEDANO (MRN [id number]) as of 04/22/2021 12:02  Ref. Range 04/21/2021 11:50 04/21/2021 16:35 04/21/2021 21:34 04/22/2021 07:26 04/22/2021 11:08  Glucose-Capillary Latest Ref Range: 70 - 99 mg/dL 266 (H) 311 (H) 308 (H) 189 (H) 295 (H)  Noted that postprandial blood sugars continue to be greater than 180 mg/dl.   Recommend adding Novolog 3 units TID with meals if patient eats at least 50% of meal and blood sugars continue to be elevated. Titrate dosages as needed.  Harvel Ricks RN BSN CDE Diabetes Coordinator Pager: [phone]  8am-5pm

## 2021-04-22 NOTE — Progress Notes (Signed)
Progress Note  Patient Name: Keith Snow Date of Encounter: 04/22/2021  Primary Cardiologist: Gwen Pounds, MD ([place])  Subjective   More comfortable this morning. Eating breakfast. Reports no chest pain.  Inpatient Medications    Scheduled Meds:  amLODipine  10 mg Oral Daily   aspirin EC  81 mg Oral Daily   budesonide (PULMICORT) nebulizer solution  0.5 mg Nebulization BID   bumetanide  2 mg Oral BID   buPROPion  100 mg Oral BID   carvedilol  25 mg Oral BID WC   Chlorhexidine Gluconate Cloth  6 each Topical Daily   cloNIDine  0.3 mg Oral TID   ferrous sulfate  325 mg Oral Q breakfast   heparin  5,000 Units Subcutaneous Q8H   hydrALAZINE  100 mg Oral Q8H   insulin aspart  0-5 Units Subcutaneous QHS   insulin aspart  0-9 Units Subcutaneous TID WC   insulin glargine-yfgn  40 Units Subcutaneous QHS   ipratropium-albuterol  3 mL Nebulization Q6H   isosorbide mononitrate  30 mg Oral Daily   mouth rinse  15 mL Mouth Rinse BID   methylPREDNISolone (SOLU-MEDROL) injection  40 mg Intravenous Q8H   pantoprazole  40 mg Oral Daily   potassium chloride  20 mEq Oral Daily   rosuvastatin  20 mg Oral Daily    PRN Meds: acetaminophen **OR** acetaminophen, ipratropium-albuterol, labetalol, ondansetron **OR** ondansetron (ZOFRAN) IV, oxyCODONE, senna-docusate, traZODone   Vital Signs    Vitals:   04/22/21 0600 04/22/21 0653 04/22/21 0726 04/22/21 0728  BP: (!) 176/89 (!) 176/89    Pulse:      Resp: 14  16   Temp:   (!) 97.5 F (36.4 C)   TempSrc:   Oral   SpO2:    98%  Weight:      Height:        Intake/Output Summary (Last 24 hours) at 04/22/2021 0845 Last data filed at 04/22/2021 0400 Gross per 24 hour  Intake 597 ml  Output 800 ml  Net -203 ml   Filed Weights   04/18/21 0500 04/20/21 0500 04/21/21 0358  Weight: 107.1 kg 107.3 kg 106.7 kg    Telemetry    Sinus rhythm.  Personally reviewed.  ECG    An ECG dated 04/10/2021 was personally reviewed today  and demonstrated:  Sinus rhythm with left atrial enlargement, nonspecific ST changes.  Physical Exam   GEN: No acute distress.   Neck: No JVD. Cardiac: RRR, no gallop. Respiratory: Nonlabored.  Decreased breath sounds at the bases. GI: Soft, nontender, bowel sounds present. MS: No pitting edema; No deformity.  Labs    Chemistry Recent Labs  Lab 04/18/21 0403 04/19/21 0405 04/20/21 0500 04/21/21 0443 04/22/21 0449  NA 137 135 137 137 137  K 3.6 3.7 3.3* 3.9 4.1  CL 104 104 106 107 105  CO2 24 22 21* 20* 21*  GLUCOSE 124* 206* 64* 162* 222*  BUN 111* 112* 104* 104* 108*  CREATININE 3.04* 3.09* 2.84* 2.55* 2.57*  CALCIUM 7.2* 7.3* 7.4* 7.5* 7.5*  PROT 6.0* 6.2*  --  6.3*  --   ALBUMIN 2.2* 2.3*  --  2.4* 2.4*  AST 11* 11*  --  20  --   ALT 17 16  --  24  --   ALKPHOS 75 65  --  73  --   BILITOT 0.4 0.7  --  0.6  --   GFRNONAA 23* 23* 25* 28* 28*  ANIONGAP 9 9 10  10 11     Hematology Recent Labs  Lab 04/18/21 0403 04/19/21 0405 04/21/21 0443  WBC 9.0 9.4 10.8*  RBC 2.75* 2.94* 2.85*  HGB 8.4* 8.8* 8.7*  HCT 26.0* 27.5* 27.3*  MCV 94.5 93.5 95.8  MCH 30.5 29.9 30.5  MCHC 32.3 32.0 31.9  RDW 13.5 13.7 14.2  PLT 251 267 276    Cardiac Enzymes Recent Labs  Lab 04/10/21 0009 04/10/21 0215  TROPONINIHS 91* 87*    BNP Recent Labs  Lab 04/16/21 0439  BNP 1,341.0*     Radiology    DG Chest Port 1 View  Result Date: 04/21/2021 CLINICAL DATA:  Shortness of breath EXAM: PORTABLE CHEST 1 VIEW COMPARISON:  04/21/2021 FINDINGS: Bilateral interstitial and patchy alveolar airspace opacities. Small left pleural effusion. No pneumothorax. Stable cardiomegaly. No acute osseous abnormality. IMPRESSION: 1. Cardiomegaly with bilateral interstitial and alveolar airspace opacities and trace left pleural effusion. Differential considerations include pulmonary edema versus multilobar pneumonia. Electronically Signed   By: Kathreen Devoid M.D.   On: 04/21/2021 19:31   DG  CHEST PORT 1 VIEW  Result Date: 04/21/2021 CLINICAL DATA:  Hypoxia EXAM: PORTABLE CHEST 1 VIEW COMPARISON:  Chest x-ray dated April 20, 2021 FINDINGS: Unchanged cardiomegaly. Bilateral heterogeneous opacities with slightly more focal appearance the right mid lung, likely due to superimposed atelectasis. Small bilateral pleural effusions. No evidence of pneumothorax. IMPRESSION: Similar bilateral heterogeneous pulmonary opacities and small bilateral pleural effusions. Electronically Signed   By: Yetta Glassman M.D.   On: 04/21/2021 08:11    Cardiac Studies   Echocardiogram 04/10/2021:  1. Left ventricular ejection fraction, by estimation, is 55 to 60%. The  left ventricle has normal function. The left ventricle has no regional  wall motion abnormalities. There is moderate concentric left ventricular  hypertrophy. Left ventricular  diastolic parameters are consistent with Grade II diastolic dysfunction  (pseudonormalization).   2. Right ventricular systolic function is normal. The right ventricular  size is normal. There is normal pulmonary artery systolic pressure. The  estimated right ventricular systolic pressure is 09.3 mmHg.   3. Left atrial size was moderately dilated.   4. Right atrial size was mildly dilated.   5. The mitral valve is grossly normal. No evidence of mitral valve  regurgitation. No evidence of mitral stenosis.   6. The aortic valve is tricuspid. Aortic valve regurgitation is not  visualized. No aortic stenosis is present.   7. The inferior vena cava is normal in size with greater than 50%  respiratory variability, suggesting right atrial pressure of 3 mmHg.   Assessment & Plan    1.  Acute on chronic diastolic heart failure, LVEF 55 to 60% with moderate LVH, moderate diastolic dysfunction, and normal RV contraction as well as estimated RVSP.  Outside records from his primary cardiologist indicate negative PYP scan for TTR amyloidosis.  Taken off Entresto and  spironolactone in light of AKI.  He continues to diurese on Bumex and creatinine is stable at 2.57.  2.  AKI on CKD stage IIIb.  Baseline creatinine 1.7-2.1, currently stable at 2.57.  Nephrology is following.  3.  Uncontrolled type 2 diabetes mellitus.  Hemoglobin A1c 10.3%.  Presently on insulin, had been on Jardiance as an outpatient per records.  4.  Essential hypertension, blood pressure control is not optimal.  Norvasc was increased to 10 mg yesterday.  5.  Mixed hyperlipidemia, on Crestor.  Continue Coreg, hydralazine, Imdur (increase to 30 mg BID), KCl, clonidine, Norvasc and Bumex.  He  is not a candidate for ARB, ANRI, Aldactone, or SGLT2 inhibitor at this time.  Signed, Rozann Lesches, MD  04/22/2021, 8:45 AM

## 2021-04-22 NOTE — Progress Notes (Signed)
PROGRESS NOTE  Keith Snow JWJ:[id number] DOB: 01-01-1961 DOA: 04/09/2021 PCP: System, Provider Not In   Brief History:  60 year old with history of DM2, HTN, MI, CHF comes to the ED with change in mental status.  Upon admission he was noted to have focal consolidation in the right lower lung, elevated BNP.  He was also hyperglycemic.  CT of the chest showed combination of severe bilateral bronchopneumonia and possible effusion.  It has been difficult to diurese him due to worsening renal function.  Nephrology and Pulm consulted for their input. Started on Broad Spectrum Abx and Steroids. Cr continues to increase slowly.    Assessment/Plan: Acute hypoxic respiratory failure requiring BiPAP and HiFlow - Multifactorial.  Combination of PNA, COPD and CHF exacerbation. -9/19 CT chest Severe b/l BronchoPNA with small b/l parapneumonic effusions.  -pt had been weaning down to 6L HFNC but on 9/27-->increase SOB and back on BiPAP -9/27 CXR personally reviewed-unchanged diffuse bilateral pulm infiltrates -Still requiring high flow nasal cannula supplementation with intermittent use of BiPAP.  Continue medications and treatment as specified below -Appreciate assistance by pulmonology service and cardiology service. -Slowly improving.   Community-acquired pneumonia. Aspiration PNA?  Concerns for BOOP - Pro-Cal 0.54>>0.34  -Cleared by speech and swallow for regular diet. - Currently on IV Zosyn 7/20>> - Bronchodilators scheduled and as needed.  I-S/flutter -Speech and swallow eval-regular diet -MRSA neg -9/26 discussed with Pulm, Dr. Alfonzo Beers to po steroids -patient has finished 7 days zosyn -Since 04/20/2021 following pulmonology service patient is steroids has been restarted through his pains at a higher dose. -Continue to follow response and further recommendations by pulmonology service. -Slowly improving.   Diabetes mellitus type 2, uncontrolled with hyperglycemia - 9/17  A1c-10.3 - Continue current dose of long-acting insulin and continue sliding scale -Continue to follow CBGs -Modify carbohydrate diet has been recommended.   Acute on chronic congestive heart failure with preserved EF, grade 2 DD, class III -04/10/21 Echo 55-60%, grade 2 DD, moderate LVH - Lasix IV by nephrology>>transition to 1/2 home dose bumex -9/26 discussed with renal--Dr. Joelyn Oms -previously on Entresto, spironolactone which are held due to AKI -Continue to follow I's and O's -Continue low-sodium diet. -Patient expressed good urine output.   Acute kidney injury on CKD stage IIIa - Admission creatinine 1.8, 2.63 > 2.71 > 3.17 >3.49 >3.84>>3.04 -Currently producing about 1.5 L of urine over last 24 hours.   -BUN rising as well but this could also be secondary to steroids.  Potassium and bicarb are relatively stable.   -Renal ultrasound is negative for acute pathology.   -appreciate renal follow up and recommendations. -he does not look to be florridly fluid overloaded -previously on Bumex 2 mg bid when he lived in [place] improved with improving renal function--restarted half home dose Bumex on 9/26 -Volume status has remained stable -Continue the use of current dose of Bumex.   Acute metabolic encephalopathy -Resolved.  -Mentation back to baseline. -Patient is oriented x3 and following commands appropriately.   Essential hypertension - Continue the use of Coreg,  hydralazine, clonidine, imdur and amlodipine. -nifedipine stopped due to concerns for shunt -Continue to follow vital signs and further adjust antihypertensive treatment as needed  Hyperlipidemia - Continue the use of Crestor        Status is: Inpatient   Remains inpatient appropriate because:Inpatient level of care appropriate due to severity of illness   Dispo: The patient is from: Home  Anticipated d/c is to: Home              Patient currently is not medically stable to d/c.               Difficult to place patient No          Family Communication:   No family at bedside.   Consultants:  pulm   Code Status:  FULL    DVT Prophylaxis:  [place] Heparin     Procedures: As Listed in Progress Note Above   Antibiotics: Zosyn 9/20>>9/27     Subjective: Overnight requiring to be placed on BiPAP secondary to shortness of breath; currently using 7 L with improvement in saturation.  Expressed breathing is a slightly better.  No chest pain, no nausea, no vomiting.  Still having good urine output.   Objective: Vitals:   04/22/21 1506 04/22/21 1600 04/22/21 1643 04/22/21 1700  BP:  (!) 187/90  (!) 165/96  Pulse:  66 68 70  Resp:  15 17 18   Temp:   (!) 97.2 F (36.2 C)   TempSrc:   Oral   SpO2: 92% 93% 93% (!) 88%  Weight:      Height:        Intake/Output Summary (Last 24 hours) at 04/22/2021 1818 Last data filed at 04/22/2021 1247 Gross per 24 hour  Intake --  Output 1750 ml  Net -1750 ml   Weight change:   Exam: General exam: Alert, awake, oriented x 3, reports breathing is improving; no chest pain, no nausea, no vomiting.  Still having dyspnea on exertion and difficulty speaking in full sentences. Respiratory system: Fair air movement; positive expiratory wheezing bilaterally; decreased breath sounds at the bases and positive rhonchi.  No using accessory muscle. Cardiovascular system:RRR. No murmurs, rubs, gallops.  No JVD on exam. Gastrointestinal system: Abdomen is nondistended, soft and nontender. No organomegaly or masses felt. Normal bowel sounds heard. Central nervous system: Alert and oriented. No focal neurological deficits. Extremities: No cyanosis or clubbing; trace edema appreciated bilaterally. Skin: No petechiae. Psychiatry: Judgement and insight appear normal. Mood & affect appropriate.    Data Reviewed: I have personally reviewed following labs and imaging studies  Basic Metabolic Panel: Recent Labs  Lab 04/16/21 0439 04/17/21 0649  04/18/21 0403 04/19/21 0405 04/20/21 0500 04/21/21 0443 04/22/21 0449  NA 133* 135 137 135 137 137 137  K 4.0 3.7 3.6 3.7 3.3* 3.9 4.1  CL 100 103 104 104 106 107 105  CO2 19* 20* 24 22 21* 20* 21*  GLUCOSE 191* 237* 124* 206* 64* 162* 222*  BUN 114* 126* 111* 112* 104* 104* 108*  CREATININE 3.84* 3.29* 3.04* 3.09* 2.84* 2.55* 2.57*  CALCIUM 7.3* 7.4* 7.2* 7.3* 7.4* 7.5* 7.5*  MG 2.8* 2.9*  --   --   --   --   --   PHOS 5.6*  --   --   --   --   --  4.8*   Liver Function Tests: Recent Labs  Lab 04/16/21 0439 04/18/21 0403 04/19/21 0405 04/21/21 0443 04/22/21 0449  AST  --  11* 11* 20  --   ALT  --  17 16 24   --   ALKPHOS  --  75 65 73  --   BILITOT  --  0.4 0.7 0.6  --   PROT  --  6.0* 6.2* 6.3*  --   ALBUMIN 2.5* 2.2* 2.3* 2.4* 2.4*   CBC: Recent Labs  Lab 04/16/21  5284 04/17/21 0649 04/18/21 0403 04/19/21 0405 04/21/21 0443  WBC 8.6 8.6 9.0 9.4 10.8*  HGB 8.6* 8.8* 8.4* 8.8* 8.7*  HCT 27.0* 27.5* 26.0* 27.5* 27.3*  MCV 94.7 94.2 94.5 93.5 95.8  PLT 211 254 251 267 276    CBG: Recent Labs  Lab 04/21/21 1635 04/21/21 2134 04/22/21 0726 04/22/21 1108 04/22/21 1642  GLUCAP 311* 308* 189* 295* 343*   Urine analysis:    Component Value Date/Time   COLORURINE YELLOW 04/14/2021 1803   APPEARANCEUR CLEAR 04/14/2021 1803   LABSPEC >1.030 (H) 04/14/2021 1803   PHURINE 5.0 04/14/2021 1803   GLUCOSEU NEGATIVE 04/14/2021 1803   HGBUR NEGATIVE 04/14/2021 1803   BILIRUBINUR NEGATIVE 04/14/2021 1803   KETONESUR NEGATIVE 04/14/2021 1803   PROTEINUR TRACE (A) 04/14/2021 1803   NITRITE NEGATIVE 04/14/2021 1803   LEUKOCYTESUR NEGATIVE 04/14/2021 1803   Recent Results (from the past 240 hour(s))  Expectorated Sputum Assessment w Gram Stain, Rflx to Resp Cult     Status: None   Collection Time: 04/16/21  7:00 PM   Specimen: Sputum  Result Value Ref Range Status   Specimen Description SPUTUM  Final   Special Requests NONE  Final   Sputum evaluation   Final     THIS SPECIMEN IS ACCEPTABLE FOR SPUTUM CULTURE PERFORMED AT [facility] Performed at [facility], [address] [place], [place] [address]    Report Status 04/16/2021 FINAL  Final  Culture, Respiratory w Gram Stain     Status: None   Collection Time: 04/16/21  7:00 PM   Specimen: SPU  Result Value Ref Range Status   Specimen Description   Final    SPUTUM Performed at [facility], [address], [place], [place] [address]    Special Requests   Final    NONE Reflexed from [id number] Performed at [facility], [address], [place], [place] [address]    Gram Stain   Final    NO WBC SEEN RARE SQUAMOUS EPITHELIAL CELLS PRESENT RARE GRAM POSITIVE COCCI RARE YEAST    Culture   Final    FEW Normal respiratory flora-no Staph aureus or Pseudomonas seen Performed at [facility], [place] [address], [place], [place] [address]    Report Status 04/19/2021 FINAL  Final     Scheduled Meds:  amLODipine  10 mg Oral Daily   aspirin EC  81 mg Oral Daily   budesonide (PULMICORT) nebulizer solution  0.5 mg Nebulization BID   bumetanide  3 mg Oral BID   buPROPion  100 mg Oral BID   carvedilol  25 mg Oral BID WC   Chlorhexidine Gluconate Cloth  6 each Topical Daily   cloNIDine  0.3 mg Oral TID   ferrous sulfate  325 mg Oral Q breakfast   heparin  5,000 Units Subcutaneous Q8H   hydrALAZINE  100 mg Oral Q8H   insulin aspart  0-5 Units Subcutaneous QHS   insulin aspart  0-9 Units Subcutaneous TID WC   [START ON 04/23/2021] insulin aspart  3 Units Subcutaneous TID WC   insulin glargine-yfgn  40 Units Subcutaneous QHS   ipratropium-albuterol  3 mL Nebulization Q6H   isosorbide mononitrate  30 mg Oral BID   mouth rinse  15 mL Mouth Rinse BID   methylPREDNISolone (SOLU-MEDROL) injection  40 mg Intravenous Q8H   pantoprazole  40 mg Oral Daily   rosuvastatin  20 mg Oral Daily   spironolactone  25 mg Oral Daily   Continuous Infusions:  Procedures/Studies:  CT CHEST WO CONTRAST  Result Date:  04/12/2021 CLINICAL DATA:  59 year old male with history of acute hypoxic respiratory failure requiring BiPAP. EXAM: CT CHEST WITHOUT CONTRAST TECHNIQUE: Multidetector CT imaging of the chest was performed following the standard protocol without IV contrast. COMPARISON:  No priors. FINDINGS: Cardiovascular: Heart size is normal. There is no significant pericardial fluid, thickening or pericardial calcification. There is aortic atherosclerosis, as well as atherosclerosis of the great vessels of the mediastinum and the coronary arteries, including calcified atherosclerotic plaque in the left anterior descending and left circumflex coronary arteries. Mediastinum/Nodes: Multiple prominent borderline enlarged mediastinal and bilateral hilar lymph nodes are noted, nonspecific and likely reactive. Esophagus is unremarkable in appearance. No axillary lymphadenopathy. Lungs/Pleura: Small bilateral pleural effusions (right greater than left) lying dependently. Patchy multifocal airspace consolidation most evident in a peribronchovascular distribution with surrounding ground-glass attenuation and septal thickening. Upper Abdomen: Unremarkable. Musculoskeletal: There are no aggressive appearing lytic or blastic lesions noted in the visualized portions of the skeleton. Multiple old healed rib fractures. IMPRESSION: 1. Severe multilobar bilateral bronchopneumonia with small bilateral parapneumonic pleural effusions lying dependently, as above. 2. Aortic atherosclerosis, in addition to 2 vessel coronary artery disease. Please note that although the presence of coronary artery calcium documents the presence of coronary artery disease, the severity of this disease and any potential stenosis cannot be assessed on this non-gated CT examination. Assessment for potential risk factor modification, dietary therapy or pharmacologic therapy may be warranted, if clinically indicated. Aortic Atherosclerosis (ICD10-I70.0). Electronically  Signed   By: Vinnie Langton M.D.   On: 04/12/2021 18:58   US RENAL  Result Date: 04/14/2021 CLINICAL DATA:  Acute renal injury EXAM: RENAL / URINARY TRACT ULTRASOUND COMPLETE COMPARISON:  None. FINDINGS: Right Kidney: Renal measurements: 12.3 x 4.5 x 5.7 cm. = volume: 165 mL. Echogenicity within normal limits. No mass or hydronephrosis visualized. Left Kidney: Renal measurements: 10.9 x 6.0 x 4.9 cm. = volume: 66 mL. Echogenicity within normal limits. No mass or hydronephrosis visualized. Bladder: Bladder is well distended. Mild wall thickening is noted anteriorly measuring up to 9 mm. This could be related to underlying UTI. Clinical correlation is recommended. Other: None. IMPRESSION: Normal-appearing kidneys bilaterally. Mild bladder wall thickening which may be inflammatory in nature. Clinical correlation is recommended. Electronically Signed   By: Inez Catalina M.D.   On: 04/14/2021 15:54   DG Chest Port 1 View  Result Date: 04/21/2021 CLINICAL DATA:  Shortness of breath EXAM: PORTABLE CHEST 1 VIEW COMPARISON:  04/21/2021 FINDINGS: Bilateral interstitial and patchy alveolar airspace opacities. Small left pleural effusion. No pneumothorax. Stable cardiomegaly. No acute osseous abnormality. IMPRESSION: 1. Cardiomegaly with bilateral interstitial and alveolar airspace opacities and trace left pleural effusion. Differential considerations include pulmonary edema versus multilobar pneumonia. Electronically Signed   By: Kathreen Devoid M.D.   On: 04/21/2021 19:31   DG CHEST PORT 1 VIEW  Result Date: 04/21/2021 CLINICAL DATA:  Hypoxia EXAM: PORTABLE CHEST 1 VIEW COMPARISON:  Chest x-ray dated April 20, 2021 FINDINGS: Unchanged cardiomegaly. Bilateral heterogeneous opacities with slightly more focal appearance the right mid lung, likely due to superimposed atelectasis. Small bilateral pleural effusions. No evidence of pneumothorax. IMPRESSION: Similar bilateral heterogeneous pulmonary opacities and small  bilateral pleural effusions. Electronically Signed   By: Yetta Glassman M.D.   On: 04/21/2021 08:11   DG Chest Port 1 View  Result Date: 04/20/2021 CLINICAL DATA:  Acute respiratory failure EXAM: PORTABLE CHEST 1 VIEW COMPARISON:  04/14/2021 FINDINGS: Bilateral airspace disease again noted, unchanged.  Heart is mildly enlarged. Possible small layering left effusion. Scattered aortic calcifications. No acute bony abnormality. IMPRESSION: Diffuse bilateral airspace disease, unchanged. Suspect layering left effusion. Electronically Signed   By: Rolm Baptise M.D.   On: 04/20/2021 08:02   DG Chest Port 1 View  Result Date: 04/14/2021 CLINICAL DATA:  Shortness of breath, history of stroke, diabetes mellitus, hypertension, MI, cancer EXAM: PORTABLE CHEST 1 VIEW COMPARISON:  Portable exam 1340 hours compared to 04/10/2021 FINDINGS: Enlargement of cardiac silhouette with pulmonary vascular congestion. Stable mediastinal contours. BILATERAL pulmonary infiltrates question pulmonary edema, slightly greater at RIGHT base. Small BILATERAL pleural effusions. No pneumothorax or acute osseous findings. IMPRESSION: Enlargement of cardiac silhouette with pulmonary vascular congestion and mild pulmonary edema. Small BILATERAL pleural effusions. Electronically Signed   By: Lavonia Dana M.D.   On: 04/14/2021 16:01   DG CHEST PORT 1 VIEW  Result Date: 04/10/2021 CLINICAL DATA:  Weakness and fatigue. EXAM: PORTABLE CHEST 1 VIEW COMPARISON:  04/09/2021 FINDINGS: 0657 hours. The cardio pericardial silhouette is enlarged. Focal airspace disease again noted right base with some probable atelectasis or infiltrate in the retrocardiac left base. There is pulmonary vascular congestion without overt pulmonary edema. Superimposed component of interstitial pulmonary edema suspected. IMPRESSION: Asymmetric focal airspace disease right base with some probable atelectasis or infiltrate in the retrocardiac left base. Interstitial edema not  excluded. Electronically Signed   By: Misty Stanley M.D.   On: 04/10/2021 07:33   DG Chest Port 1 View  Result Date: 04/09/2021 CLINICAL DATA:  Fever and weakness EXAM: PORTABLE CHEST 1 VIEW COMPARISON:  None. FINDINGS: The heart and mediastinal contours are within normal limits. Aortic calcification. Right lower lung zone focal consolidation. Question retrocardiac opacity. No pulmonary edema. Possible trace left pleural effusion. No right pleural effusion. No pneumothorax. No acute osseous abnormality. IMPRESSION: Right lower lung zone focal consolidation. Question retrocardiac opacity with possible trace left pleural effusion. Findings consistent with infection/inflammation. Followup PA and lateral chest X-ray is recommended in 3-4 weeks following therapy to ensure resolution and exclude underlying malignancy. Electronically Signed   By: Iven Finn M.D.   On: 04/09/2021 23:59   ECHOCARDIOGRAM COMPLETE  Result Date: 04/10/2021    ECHOCARDIOGRAM REPORT   Patient Name:   ALERIC Stroder Date of Exam: 04/10/2021 Medical Rec #:  [id number]    Height:       69.0 in Accession #:    [id number]   Weight:       217.0 lb Date of Birth:  [birth year]    BSA:          2.139 m Patient Age:    5 years     BP:           142/83 mmHg Patient Gender: M            HR:           85 bpm. Exam Location:  [facility] Procedure: 2D Echo, Cardiac Doppler and Color Doppler Indications:    SBE I33.9  History:        Patient has no prior history of Echocardiogram examinations.                 Previous Myocardial Infarction, Stroke; Risk                 Factors:Hypertension and Diabetes. Hx of cancer. 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  Sonographer:  Alvino Chapel RCS Referring Phys: [id number] SEYED A SHAHMEHDI IMPRESSIONS  1. Left ventricular ejection fraction, by estimation, is 55 to 60%. The left ventricle has normal function. The left ventricle has no regional wall motion abnormalities. There is moderate  concentric left ventricular hypertrophy. Left ventricular diastolic parameters are consistent with Grade II diastolic dysfunction (pseudonormalization).  2. Right ventricular systolic function is normal. The right ventricular size is normal. There is normal pulmonary artery systolic pressure. The estimated right ventricular systolic pressure is 54.6 mmHg.  3. Left atrial size was moderately dilated.  4. Right atrial size was mildly dilated.  5. The mitral valve is grossly normal. No evidence of mitral valve regurgitation. No evidence of mitral stenosis.  6. The aortic valve is tricuspid. Aortic valve regurgitation is not visualized. No aortic stenosis is present.  7. The inferior vena cava is normal in size with greater than 50% respiratory variability, suggesting right atrial pressure of 3 mmHg. FINDINGS  Left Ventricle: Left ventricular ejection fraction, by estimation, is 55 to 60%. The left ventricle has normal function. The left ventricle has no regional wall motion abnormalities. The left ventricular internal cavity size was normal in size. There is  moderate concentric left ventricular hypertrophy. Left ventricular diastolic parameters are consistent with Grade II diastolic dysfunction (pseudonormalization). Right Ventricle: The right ventricular size is normal. No increase in right ventricular wall thickness. Right ventricular systolic function is normal. There is normal pulmonary artery systolic pressure. The tricuspid regurgitant velocity is 2.10 m/s, and  with an assumed right atrial pressure of 3 mmHg, the estimated right ventricular systolic pressure is 50.3 mmHg. Left Atrium: Left atrial size was moderately dilated. Right Atrium: Right atrial size was mildly dilated. Pericardium: There is no evidence of pericardial effusion. Mitral Valve: The mitral valve is grossly normal. No evidence of mitral valve regurgitation. No evidence of mitral valve stenosis. Tricuspid Valve: The tricuspid valve is grossly  normal. Tricuspid valve regurgitation is trivial. No evidence of tricuspid stenosis. Aortic Valve: The aortic valve is tricuspid. Aortic valve regurgitation is not visualized. No aortic stenosis is present. Pulmonic Valve: The pulmonic valve was grossly normal. Pulmonic valve regurgitation is not visualized. No evidence of pulmonic stenosis. Aorta: The aortic root is normal in size and structure. Venous: The inferior vena cava is normal in size with greater than 50% respiratory variability, suggesting right atrial pressure of 3 mmHg. IAS/Shunts: The atrial septum is grossly normal.  LEFT VENTRICLE PLAX 2D LVIDd:         4.70 cm  Diastology LVIDs:         3.10 cm  LV e' medial:    6.74 cm/s LV PW:         1.50 cm  LV E/e' medial:  16.5 LV IVS:        1.50 cm  LV e' lateral:   9.14 cm/s LVOT diam:     2.00 cm  LV E/e' lateral: 12.1 LV SV:         65 LV SV Index:   31 LVOT Area:     3.14 cm  RIGHT VENTRICLE RV S prime:     13.80 cm/s TAPSE (M-mode): 2.6 cm LEFT ATRIUM             Index       RIGHT ATRIUM           Index LA diam:        4.20 cm 1.96 cm/m  RA Area:     24.30  cm LA Vol (A2C):   79.4 ml 37.12 ml/m RA Volume:   81.40 ml  38.06 ml/m LA Vol (A4C):   93.6 ml 43.76 ml/m LA Biplane Vol: 86.2 ml 40.30 ml/m  AORTIC VALVE LVOT Vmax:   102.00 cm/s LVOT Vmean:  62.500 cm/s LVOT VTI:    0.208 m  AORTA Ao Root diam: 3.60 cm MITRAL VALVE                TRICUSPID VALVE MV Area (PHT): 4.15 cm     TR Peak grad:   17.6 mmHg MV Decel Time: 183 msec     TR Vmax:        210.00 cm/s MV E velocity: 111.00 cm/s MV A velocity: 79.70 cm/s   SHUNTS MV E/A ratio:  1.39         Systemic VTI:  0.21 m                             Systemic Diam: 2.00 cm Eleonore Chiquito MD Electronically signed by Eleonore Chiquito MD Signature Date/Time: 04/10/2021/1:25:55 PM    Final    [place] EKG SITE RITE  Result Date: 04/17/2021 If Site Rite image not attached, placement could not be confirmed due to current cardiac rhythm.   Barton Dubois,  MD  [facility]  If 7PM-7AM, please contact night-coverage [email or web address] Password [email or web address] 04/22/2021, 6:18 PM   LOS: 12 days

## 2021-04-23 DIAGNOSIS — R739 Hyperglycemia, unspecified: Secondary | ICD-10-CM | POA: Diagnosis not present

## 2021-04-23 DIAGNOSIS — J9601 Acute respiratory failure with hypoxia: Secondary | ICD-10-CM | POA: Diagnosis not present

## 2021-04-23 DIAGNOSIS — N179 Acute kidney failure, unspecified: Secondary | ICD-10-CM | POA: Diagnosis not present

## 2021-04-23 DIAGNOSIS — I5033 Acute on chronic diastolic (congestive) heart failure: Secondary | ICD-10-CM | POA: Diagnosis not present

## 2021-04-23 LAB — RENAL FUNCTION PANEL
Albumin: 2.4 g/dL — ABNORMAL LOW (ref 3.5–5.0)
Anion gap: 10 (ref 5–15)
BUN: 108 mg/dL — ABNORMAL HIGH (ref 6–20)
CO2: 22 mmol/L (ref 22–32)
Calcium: 7.7 mg/dL — ABNORMAL LOW (ref 8.9–10.3)
Chloride: 102 mmol/L (ref 98–111)
Creatinine, Ser: 2.54 mg/dL — ABNORMAL HIGH (ref 0.61–1.24)
GFR, Estimated: 29 mL/min — ABNORMAL LOW (ref >60)
Glucose, Bld: 345 mg/dL — ABNORMAL HIGH (ref 70–99)
Phosphorus: 4.7 mg/dL — ABNORMAL HIGH (ref 2.5–4.6)
Potassium: 4.7 mmol/L (ref 3.5–5.1)
Sodium: 134 mmol/L — ABNORMAL LOW (ref 135–145)

## 2021-04-23 LAB — GLUCOSE, CAPILLARY
Glucose-Capillary: 323 mg/dL — ABNORMAL HIGH (ref 70–99)
Glucose-Capillary: 258 mg/dL — ABNORMAL HIGH (ref 70–99)
Glucose-Capillary: 267 mg/dL — ABNORMAL HIGH (ref 70–99)
Glucose-Capillary: 262 mg/dL — ABNORMAL HIGH (ref 70–99)

## 2021-04-23 MED ORDER — SODIUM CHLORIDE 0.9 % IV SOLN
510.0000 mg | Freq: Once | INTRAVENOUS | Status: AC
  Administered 2021-04-24: 510 mg via INTRAVENOUS
  Filled 2021-04-23: qty 510

## 2021-04-23 MED ORDER — METHYLPREDNISOLONE SODIUM SUCC 40 MG IJ SOLR
40.0000 mg | Freq: Two times a day (BID) | INTRAMUSCULAR | Status: DC
  Administered 2021-04-23 – 2021-04-27 (×8): 40 mg via INTRAVENOUS
  Filled 2021-04-23 (×7): qty 1

## 2021-04-23 NOTE — Progress Notes (Signed)
Progress Note  Patient Name: Keith Snow Date of Encounter: 04/23/2021  Primary Cardiologist: Gwen Pounds, MD ([place])  Subjective   Hoping to go home soon.  States he does feel better overall in terms of his breathing, no chest pain.  Appetite good.  Good urine output yesterday.  Inpatient Medications    Scheduled Meds:  amLODipine  10 mg Oral Daily   aspirin EC  81 mg Oral Daily   budesonide (PULMICORT) nebulizer solution  0.5 mg Nebulization BID   bumetanide  3 mg Oral BID   buPROPion  100 mg Oral BID   carvedilol  25 mg Oral BID WC   Chlorhexidine Gluconate Cloth  6 each Topical Daily   cloNIDine  0.3 mg Oral TID   ferrous sulfate  325 mg Oral Q breakfast   heparin  5,000 Units Subcutaneous Q8H   hydrALAZINE  100 mg Oral Q8H   insulin aspart  0-5 Units Subcutaneous QHS   insulin aspart  0-9 Units Subcutaneous TID WC   insulin aspart  3 Units Subcutaneous TID WC   insulin glargine-yfgn  40 Units Subcutaneous QHS   ipratropium-albuterol  3 mL Nebulization Q6H   isosorbide mononitrate  30 mg Oral BID   mouth rinse  15 mL Mouth Rinse BID   methylPREDNISolone (SOLU-MEDROL) injection  40 mg Intravenous Q8H   pantoprazole  40 mg Oral Daily   rosuvastatin  20 mg Oral Daily   spironolactone  25 mg Oral Daily    [START ON 04/24/2021] ferumoxytol     PRN Meds: acetaminophen **OR** acetaminophen, ipratropium-albuterol, labetalol, ondansetron **OR** ondansetron (ZOFRAN) IV, oxyCODONE, senna-docusate, traZODone   Vital Signs    Vitals:   04/23/21 0700 04/23/21 0758 04/23/21 0800 04/23/21 0900  BP: (!) 161/78  (!) 176/78   Pulse: 61 61 60 69  Resp: 12 15 12  (!) 23  Temp:  97.9 F (36.6 C)    TempSrc:  Axillary    SpO2: 100% 100% 100% 100%  Weight:      Height:        Intake/Output Summary (Last 24 hours) at 04/23/2021 0925 Last data filed at 04/23/2021 0700 Gross per 24 hour  Intake --  Output 2720 ml  Net -2720 ml   Filed Weights   04/20/21 0500  04/21/21 0358 04/23/21 0500  Weight: 107.3 kg 106.7 kg 108.7 kg    Telemetry    Sinus rhythm.  Personally reviewed.  ECG    No new ECG reviewed today.  Physical Exam   GEN: No acute distress.   Neck: No JVD. Cardiac: RRR, no gallop.  Physiologically split S2. Respiratory: Nonlabored.  Decreased breath sounds at the bases.  No wheezing. GI: Soft, nontender, bowel sounds present. MS: No pitting edema; No deformity.  Labs    Chemistry Recent Labs  Lab 04/18/21 0403 04/19/21 0405 04/20/21 0500 04/21/21 0443 04/22/21 0449 04/23/21 0433  NA 137 135   < > 137 137 134*  K 3.6 3.7   < > 3.9 4.1 4.7  CL 104 104   < > 107 105 102  CO2 24 22   < > 20* 21* 22  GLUCOSE 124* 206*   < > 162* 222* 345*  BUN 111* 112*   < > 104* 108* 108*  CREATININE 3.04* 3.09*   < > 2.55* 2.57* 2.54*  CALCIUM 7.2* 7.3*   < > 7.5* 7.5* 7.7*  PROT 6.0* 6.2*  --  6.3*  --   --   ALBUMIN  2.2* 2.3*  --  2.4* 2.4* 2.4*  AST 11* 11*  --  20  --   --   ALT 17 16  --  24  --   --   ALKPHOS 75 65  --  73  --   --   BILITOT 0.4 0.7  --  0.6  --   --   GFRNONAA 23* 23*   < > 28* 28* 29*  ANIONGAP 9 9   < > 10 11 10    < > = values in this interval not displayed.     Hematology Recent Labs  Lab 04/18/21 0403 04/19/21 0405 04/21/21 0443  WBC 9.0 9.4 10.8*  RBC 2.75* 2.94* 2.85*  HGB 8.4* 8.8* 8.7*  HCT 26.0* 27.5* 27.3*  MCV 94.5 93.5 95.8  MCH 30.5 29.9 30.5  MCHC 32.3 32.0 31.9  RDW 13.5 13.7 14.2  PLT 251 267 276    Cardiac Enzymes Recent Labs  Lab 04/10/21 0009 04/10/21 0215  TROPONINIHS 91* 87*    BNP No results for input(s): BNP, PROBNP in the last 168 hours.    Radiology    DG Chest Port 1 View  Result Date: 04/21/2021 CLINICAL DATA:  Shortness of breath EXAM: PORTABLE CHEST 1 VIEW COMPARISON:  04/21/2021 FINDINGS: Bilateral interstitial and patchy alveolar airspace opacities. Small left pleural effusion. No pneumothorax. Stable cardiomegaly. No acute osseous abnormality.  IMPRESSION: 1. Cardiomegaly with bilateral interstitial and alveolar airspace opacities and trace left pleural effusion. Differential considerations include pulmonary edema versus multilobar pneumonia. Electronically Signed   By: Kathreen Devoid M.D.   On: 04/21/2021 19:31    Cardiac Studies   Echocardiogram 04/10/2021:  1. Left ventricular ejection fraction, by estimation, is 55 to 60%. The  left ventricle has normal function. The left ventricle has no regional  wall motion abnormalities. There is moderate concentric left ventricular  hypertrophy. Left ventricular  diastolic parameters are consistent with Grade II diastolic dysfunction  (pseudonormalization).   2. Right ventricular systolic function is normal. The right ventricular  size is normal. There is normal pulmonary artery systolic pressure. The  estimated right ventricular systolic pressure is 61.9 mmHg.   3. Left atrial size was moderately dilated.   4. Right atrial size was mildly dilated.   5. The mitral valve is grossly normal. No evidence of mitral valve  regurgitation. No evidence of mitral stenosis.   6. The aortic valve is tricuspid. Aortic valve regurgitation is not  visualized. No aortic stenosis is present.   7. The inferior vena cava is normal in size with greater than 50%  respiratory variability, suggesting right atrial pressure of 3 mmHg.   Assessment & Plan    1.  Acute on chronic diastolic heart failure, LVEF 55 to 60% with moderate LVH, moderate diastolic dysfunction, and normal RV contraction as well as estimated RVSP.  Outside records from his primary cardiologist indicate negative PYP scan for TTR amyloidosis.  Taken off Entresto and spironolactone in light of AKI.  He is tolerating Bumex with stable renal function and good urine output last 24 hours, net output of 2700 cc.  2.  AKI on CKD stage IIIb.  Baseline creatinine 1.7-2.1, currently stable at 2.54.  Nephrology is following.  3.  Uncontrolled type 2  diabetes mellitus.  Hemoglobin A1c 10.3%.  Presently on insulin, had been on Jardiance as an outpatient per records but not good candidate for SGLT2 inhibitor at this time due to degree of renal insufficiency.  4.  Essential  hypertension, blood pressure control has not been optimal.  He is on multimodal therapy.  5.  Mixed hyperlipidemia, on Crestor.  Continue Coreg, Norvasc, clonidine, hydralazine, Imdur, Aldactone and Bumex.  Still with oxygen requirement and working with Pulmonary and Nephrology.  He is not a candidate for ARB, ANRI, or SGLT2 inhibitor at this time.  Tolerating diuresis in terms of renal function and good urine output.  Creatinine is closer to prior baseline.  Signed, Rozann Lesches, MD  04/23/2021, 9:25 AM

## 2021-04-23 NOTE — Progress Notes (Addendum)
Admit: 04/09/2021 LOS: 49  89M AoCKD3, CAP, HFpEF with acute exacerbaiton  Subjective:   UOP 2720 -  improved  SCR and BUN steady   -11 L since admit BPs still up but better on amlodipine 10/bumex 3 BID/coreg 25 BID/hydralazine 100 q 8/aldactone 25 daily Remains with high O2 req-  had CXR yest due to more SOB-  airspace opacities still-  edema vs infection Sugar is up   09/29 0701 - 09/30 0700 In: -  Out: 2720 [Urine:2720]  Filed Weights   04/20/21 0500 04/21/21 0358 04/23/21 0500  Weight: 107.3 kg 106.7 kg 108.7 kg    Scheduled Meds:  amLODipine  10 mg Oral Daily   aspirin EC  81 mg Oral Daily   budesonide (PULMICORT) nebulizer solution  0.5 mg Nebulization BID   bumetanide  3 mg Oral BID   buPROPion  100 mg Oral BID   carvedilol  25 mg Oral BID WC   Chlorhexidine Gluconate Cloth  6 each Topical Daily   cloNIDine  0.3 mg Oral TID   ferrous sulfate  325 mg Oral Q breakfast   heparin  5,000 Units Subcutaneous Q8H   hydrALAZINE  100 mg Oral Q8H   insulin aspart  0-5 Units Subcutaneous QHS   insulin aspart  0-9 Units Subcutaneous TID WC   insulin aspart  3 Units Subcutaneous TID WC   insulin glargine-yfgn  40 Units Subcutaneous QHS   ipratropium-albuterol  3 mL Nebulization Q6H   isosorbide mononitrate  30 mg Oral BID   mouth rinse  15 mL Mouth Rinse BID   methylPREDNISolone (SOLU-MEDROL) injection  40 mg Intravenous Q8H   pantoprazole  40 mg Oral Daily   rosuvastatin  20 mg Oral Daily   spironolactone  25 mg Oral Daily   Continuous Infusions:   PRN Meds:.acetaminophen **OR** acetaminophen, ipratropium-albuterol, labetalol, ondansetron **OR** ondansetron (ZOFRAN) IV, oxyCODONE, senna-docusate, traZODone  Current Labs: reviewed    Physical Exam:  Blood pressure (!) 161/78, pulse 61, temperature 97.9 F (36.6 C), temperature source Axillary, resp. rate 15, height 5\' 9"  (1.753 m), weight 108.7 kg, SpO2 100 %. NAD, sitting in chair, mild inc wob/inc RR Trace to 1+ LEE   RRR nl s1s2 Diminished in bases, clear otherwise NO rashes/lesions AAO x3 Nonfocal EOMI,  NCAT  A AoCKD3, slowly resolving, nonoliguric, neg imaging at admission; has nephrologist in [place].  BL crt 1.7-2-3 Hypoxic RF / CAP / BOOP, ABX and steroids per TRH/PCCM.  Improved ?   remains on at least 6L [place] HFpEF, vol status cont to slowly improve  HTN, not at goal-  steroids are not helping-  increased bumex again and added back aldactone on 9/29 Anemia, Hb stable 8s DM2, hyperglycemia, uncontrolled, per TRH HLD Hypokalemia, resolved  P D/w PCCM and TRH,    Bumex  was 50% of his home dose prior to admission-  taken back up to full dose on 9/28-  inc to 3 BID on 9/29 Cont to hold RAASi, SGLT2i although  added back aldactone -  this will help BP/volume and K and allow to come off of clonidine Daily weights, Daily Renal Panel, Strict I/Os, Avoid nephrotoxins (NSAIDs, judicious IV Contrast)  Will give IV iron- gave first dose on Wed and treat with aranesp as well -  200 given Wed-  will give second dose of iron on Sat No changes in extensive BP regimen for now   Will not be physically seen over the weekend but labs and UOP will be  reviewed-  may be seen if renal function heading in wrong direction.  Call with questions    Louis Meckel  04/23/2021, 8:15 AM  Recent Labs  Lab 04/21/21 0443 04/22/21 0449 04/23/21 0433  NA 137 137 134*  K 3.9 4.1 4.7  CL 107 105 102  CO2 20* 21* 22  GLUCOSE 162* 222* 345*  BUN 104* 108* 108*  CREATININE 2.55* 2.57* 2.54*  CALCIUM 7.5* 7.5* 7.7*  PHOS  --  4.8* 4.7*   Recent Labs  Lab 04/18/21 0403 04/19/21 0405 04/21/21 0443  WBC 9.0 9.4 10.8*  HGB 8.4* 8.8* 8.7*  HCT 26.0* 27.5* 27.3*  MCV 94.5 93.5 95.8  PLT 251 267 276

## 2021-04-23 NOTE — TOC Progression Note (Signed)
Transition of Care [facility]) - Progression Note    Patient Details  Name: Keith Snow MRN: [id number] Date of Birth: July 19, 1961  [facility]) CM/SW Contact  Shade Flood, LCSW Phone Number: 04/23/2021, 11:24 AM  Clinical Narrative:     TOC following. Per MD, pt can dc once his O2 need is 6L or less and he is off bipap. Anticipating pt will need O2 from [employer] at dc. Updated Caryl Pina with [employer] and also weekend TOC to follow.  Expected Discharge Plan: Home/Self Care Barriers to Discharge: Continued Medical Work up  Expected Discharge Plan and Services Expected Discharge Plan: Home/Self Care       Living arrangements for the past 2 months: Single Family Home                 DME Arranged: Oxygen DME Agency: [employer] Date DME Agency Contacted: 04/19/21 Time DME Agency Contacted: [id number] Representative spoke with at DME Agency: [place] ([place]) Interventions    Readmission Risk Interventions No flowsheet data found.

## 2021-04-23 NOTE — Progress Notes (Signed)
PROGRESS NOTE  Dayna Geurts FXT:[id number] DOB: [birth year] DOA: 04/09/2021 PCP: System, Provider Not In   Brief History:  60 year old with history of DM2, HTN, MI, CHF comes to the ED with change in mental status.  Upon admission he was noted to have focal consolidation in the right lower lung, elevated BNP.  He was also hyperglycemic.  CT of the chest showed combination of severe bilateral bronchopneumonia and possible effusion.  It has been difficult to diurese him due to worsening renal function.  Nephrology and Pulm consulted for their input. Started on Broad Spectrum Abx and Steroids. Cr continues to increase slowly.    Assessment/Plan: Acute hypoxic respiratory failure requiring BiPAP and HiFlow - Multifactorial.  Combination of PNA, COPD and CHF exacerbation. -9/19 CT chest Severe b/l BronchoPNA with small b/l parapneumonic effusions.  -pt had been weaning down to 6L HFNC but on 9/27-->increase SOB and back on BiPAP -9/27 CXR personally reviewed-unchanged diffuse bilateral pulm infiltrates -Still requiring high flow nasal cannula supplementation with intermittent use of BiPAP.  Continue medications and treatment as specified below -Appreciate assistance by pulmonology service and cardiology service. -Slowly improving.   Community-acquired pneumonia. Aspiration PNA?  Concerns for BOOP - Pro-Cal 0.54>>0.34  -Cleared by speech and swallow for regular diet. - Currently on IV Zosyn 7/20>> - Bronchodilators scheduled and as needed.  I-S/flutter -Speech and swallow eval-regular diet -MRSA neg -9/26 discussed with Pulm, Dr. Alfonzo Beers to po steroids -patient has finished 7 days zosyn; remained afebrile. -Since 04/20/2021 following pulmonology service patient is steroids has been restarted through his pains at a higher dose. Planning for slow tapering once hypoxia further improved (O2 supplementation weaned down to 4L) -Continue to follow response and further recommendations by  pulmonology service. -Slowly improving.   Diabetes mellitus type 2, uncontrolled with hyperglycemia - 9/17 A1c-10.3 - Continue current dose of long-acting insulin and continue sliding scale -Continue to follow CBGs -Modify carbohydrate diet has been recommended.   Acute on chronic congestive heart failure with preserved EF, grade 2 DD, class III -04/10/21 Echo 55-60%, grade 2 DD, moderate LVH - Lasix IV by nephrology>>transition to 1/2 home dose bumex -9/26 discussed with renal--Dr. Joelyn Oms -previously on Entresto, spironolactone which were held due to AKI. Per nephrology will resume spironolactone. -Continue to follow I's and O's -Continue low-sodium diet. -Patient expressed good urine output.   Acute kidney injury on CKD stage IIIa - Admission creatinine 1.8, 2.63 > 2.71 > 3.17 >3.49 >3.84>>3.04 -Currently producing about 1.5 L of urine over last 24 hours.   -BUN rising as well but this could also be secondary to steroids.  Potassium and bicarb are relatively stable.   -Renal ultrasound is negative for acute pathology.   -appreciate renal follow up and recommendations. -he does not look to be florridly fluid overloaded -previously on Bumex 2 mg bid when he lived in [place] -Volume status has remained stable -Continue the use of current dose of Bumex and spironolactone as recommended by nephrology..   Acute metabolic encephalopathy -Resolved.  -Mentation back to baseline. -Patient is oriented x3 and following commands appropriately.   Essential hypertension - Continue the use of Coreg,  hydralazine, clonidine, imdur and amlodipine. -nifedipine stopped due to concerns for shunt -Continue to follow vital signs and further adjust antihypertensive treatment as needed -Following nephrology service recommendation and spironolactone has been added.  Hyperlipidemia - Continue the use of Crestor        Status is: Inpatient  Remains inpatient appropriate because:Inpatient level of  care appropriate due to severity of illness   Dispo: The patient is from: Home              Anticipated d/c is to: Home              Patient currently is not medically stable to d/c.              Difficult to place patient No          Family Communication:   No family at bedside.   Consultants:  pulm   Code Status:  FULL    DVT Prophylaxis:  [place] Heparin     Procedures: As Listed in Progress Note Above   Antibiotics: Zosyn 9/20>>9/27     Subjective: Patient reports breathing continues to improve; no chest pain, no nausea, no vomiting.  Good urine output reported.  Still requiring 6-7 L high flow nasal cannula supplementation.   Objective: Vitals:   04/23/21 1300 04/23/21 1400 04/23/21 1405 04/23/21 1500  BP: (!) 172/83 (!) 162/80  (!) 156/67  Pulse:      Resp: 14 16  15   Temp:      TempSrc:      SpO2:   98%   Weight:      Height:        Intake/Output Summary (Last 24 hours) at 04/23/2021 1702 Last data filed at 04/23/2021 1200 Gross per 24 hour  Intake 960 ml  Output 2370 ml  Net -1410 ml   Weight change:   Exam: General exam: Alert, awake, oriented x 3; reporting good urine output, no chest pain, no nausea, no vomiting.  Well-tolerated use of BiPAP nightly.  6-7 L nasal cannula supplementation in place. Respiratory system: Decreased breath sounds at the bases; no using accessory muscles.  Positive rhonchi.  No expiratory wheezing. Cardiovascular system:RRR. No murmurs, rubs, gallops.  No JVD on exam currently. Gastrointestinal system: Abdomen is nondistended, soft and nontender. No organomegaly or masses felt. Normal bowel sounds heard. Central nervous system: Alert and oriented. No focal neurological deficits. Extremities: No cyanosis or clubbing; trace edema appreciated bilaterally. Skin: No petechiae. Psychiatry: Judgement and insight appear normal. Mood & affect appropriate.   Data Reviewed: I have personally reviewed following labs and imaging  studies  Basic Metabolic Panel: Recent Labs  Lab 04/17/21 0649 04/18/21 0403 04/19/21 0405 04/20/21 0500 04/21/21 0443 04/22/21 0449 04/23/21 0433  NA 135   < > 135 137 137 137 134*  K 3.7   < > 3.7 3.3* 3.9 4.1 4.7  CL 103   < > 104 106 107 105 102  CO2 20*   < > 22 21* 20* 21* 22  GLUCOSE 237*   < > 206* 64* 162* 222* 345*  BUN 126*   < > 112* 104* 104* 108* 108*  CREATININE 3.29*   < > 3.09* 2.84* 2.55* 2.57* 2.54*  CALCIUM 7.4*   < > 7.3* 7.4* 7.5* 7.5* 7.7*  MG 2.9*  --   --   --   --   --   --   PHOS  --   --   --   --   --  4.8* 4.7*   < > = values in this interval not displayed.   Liver Function Tests: Recent Labs  Lab 04/18/21 0403 04/19/21 0405 04/21/21 0443 04/22/21 0449 04/23/21 0433  AST 11* 11* 20  --   --   ALT 17 16 24   --   --  ALKPHOS 75 65 73  --   --   BILITOT 0.4 0.7 0.6  --   --   PROT 6.0* 6.2* 6.3*  --   --   ALBUMIN 2.2* 2.3* 2.4* 2.4* 2.4*   CBC: Recent Labs  Lab 04/17/21 0649 04/18/21 0403 04/19/21 0405 04/21/21 0443  WBC 8.6 9.0 9.4 10.8*  HGB 8.8* 8.4* 8.8* 8.7*  HCT 27.5* 26.0* 27.5* 27.3*  MCV 94.2 94.5 93.5 95.8  PLT 254 251 267 276    CBG: Recent Labs  Lab 04/22/21 1642 04/22/21 2143 04/23/21 0755 04/23/21 1138 04/23/21 1600  GLUCAP 343* 344* 323* 258* 267*   Urine analysis:    Component Value Date/Time   COLORURINE YELLOW 04/14/2021 1803   APPEARANCEUR CLEAR 04/14/2021 1803   LABSPEC >1.030 (H) 04/14/2021 1803   PHURINE 5.0 04/14/2021 1803   GLUCOSEU NEGATIVE 04/14/2021 1803   HGBUR NEGATIVE 04/14/2021 1803   BILIRUBINUR NEGATIVE 04/14/2021 1803   KETONESUR NEGATIVE 04/14/2021 1803   PROTEINUR TRACE (A) 04/14/2021 1803   NITRITE NEGATIVE 04/14/2021 1803   LEUKOCYTESUR NEGATIVE 04/14/2021 1803   Recent Results (from the past 240 hour(s))  Expectorated Sputum Assessment w Gram Stain, Rflx to Resp Cult     Status: None   Collection Time: 04/16/21  7:00 PM   Specimen: Sputum  Result Value Ref Range Status    Specimen Description SPUTUM  Final   Special Requests NONE  Final   Sputum evaluation   Final    THIS SPECIMEN IS ACCEPTABLE FOR SPUTUM CULTURE PERFORMED AT [facility] Performed at [facility], [address], [place], [place] [address]    Report Status 04/16/2021 FINAL  Final  Culture, Respiratory w Gram Stain     Status: None   Collection Time: 04/16/21  7:00 PM   Specimen: SPU  Result Value Ref Range Status   Specimen Description   Final    SPUTUM Performed at [facility], [address], [place], [place] [address]    Special Requests   Final    NONE Reflexed from [id number] Performed at [facility], [address] [place], [place] [address]    Gram Stain   Final    NO WBC SEEN RARE SQUAMOUS EPITHELIAL CELLS PRESENT RARE GRAM POSITIVE COCCI RARE YEAST    Culture   Final    FEW Normal respiratory flora-no Staph aureus or Pseudomonas seen Performed at [facility], [place] [address] [place], [place] [address]    Report Status 04/19/2021 FINAL  Final     Scheduled Meds:  amLODipine  10 mg Oral Daily   aspirin EC  81 mg Oral Daily   budesonide (PULMICORT) nebulizer solution  0.5 mg Nebulization BID   bumetanide  3 mg Oral BID   buPROPion  100 mg Oral BID   carvedilol  25 mg Oral BID WC   Chlorhexidine Gluconate Cloth  6 each Topical Daily   cloNIDine  0.3 mg Oral TID   ferrous sulfate  325 mg Oral Q breakfast   heparin  5,000 Units Subcutaneous Q8H   hydrALAZINE  100 mg Oral Q8H   insulin aspart  0-5 Units Subcutaneous QHS   insulin aspart  0-9 Units Subcutaneous TID WC   insulin aspart  3 Units Subcutaneous TID WC   insulin glargine-yfgn  40 Units Subcutaneous QHS   ipratropium-albuterol  3 mL Nebulization Q6H   isosorbide mononitrate  30 mg Oral BID   mouth rinse  15 mL Mouth Rinse BID   methylPREDNISolone (  SOLU-MEDROL) injection  40 mg Intravenous Q12H   pantoprazole  40 mg Oral Daily   rosuvastatin  20 mg Oral Daily   spironolactone  25 mg Oral Daily    Continuous Infusions:  [START ON 04/24/2021] ferumoxytol      Procedures/Studies: CT CHEST WO CONTRAST  Result Date: 04/12/2021 CLINICAL DATA:  60 year old male with history of acute hypoxic respiratory failure requiring BiPAP. EXAM: CT CHEST WITHOUT CONTRAST TECHNIQUE: Multidetector CT imaging of the chest was performed following the standard protocol without IV contrast. COMPARISON:  No priors. FINDINGS: Cardiovascular: Heart size is normal. There is no significant pericardial fluid, thickening or pericardial calcification. There is aortic atherosclerosis, as well as atherosclerosis of the great vessels of the mediastinum and the coronary arteries, including calcified atherosclerotic plaque in the left anterior descending and left circumflex coronary arteries. Mediastinum/Nodes: Multiple prominent borderline enlarged mediastinal and bilateral hilar lymph nodes are noted, nonspecific and likely reactive. Esophagus is unremarkable in appearance. No axillary lymphadenopathy. Lungs/Pleura: Small bilateral pleural effusions (right greater than left) lying dependently. Patchy multifocal airspace consolidation most evident in a peribronchovascular distribution with surrounding ground-glass attenuation and septal thickening. Upper Abdomen: Unremarkable. Musculoskeletal: There are no aggressive appearing lytic or blastic lesions noted in the visualized portions of the skeleton. Multiple old healed rib fractures. IMPRESSION: 1. Severe multilobar bilateral bronchopneumonia with small bilateral parapneumonic pleural effusions lying dependently, as above. 2. Aortic atherosclerosis, in addition to 2 vessel coronary artery disease. Please note that although the presence of coronary artery calcium documents the presence of coronary artery disease, the severity of this disease and any potential stenosis cannot be assessed on this non-gated CT examination. Assessment for potential risk factor modification, dietary therapy  or pharmacologic therapy may be warranted, if clinically indicated. Aortic Atherosclerosis (ICD10-I70.0). Electronically Signed   By: Vinnie Langton M.D.   On: 04/12/2021 18:58   US RENAL  Result Date: 04/14/2021 CLINICAL DATA:  Acute renal injury EXAM: RENAL / URINARY TRACT ULTRASOUND COMPLETE COMPARISON:  None. FINDINGS: Right Kidney: Renal measurements: 12.3 x 4.5 x 5.7 cm. = volume: 165 mL. Echogenicity within normal limits. No mass or hydronephrosis visualized. Left Kidney: Renal measurements: 10.9 x 6.0 x 4.9 cm. = volume: 66 mL. Echogenicity within normal limits. No mass or hydronephrosis visualized. Bladder: Bladder is well distended. Mild wall thickening is noted anteriorly measuring up to 9 mm. This could be related to underlying UTI. Clinical correlation is recommended. Other: None. IMPRESSION: Normal-appearing kidneys bilaterally. Mild bladder wall thickening which may be inflammatory in nature. Clinical correlation is recommended. Electronically Signed   By: Inez Catalina M.D.   On: 04/14/2021 15:54   DG Chest Port 1 View  Result Date: 04/21/2021 CLINICAL DATA:  Shortness of breath EXAM: PORTABLE CHEST 1 VIEW COMPARISON:  04/21/2021 FINDINGS: Bilateral interstitial and patchy alveolar airspace opacities. Small left pleural effusion. No pneumothorax. Stable cardiomegaly. No acute osseous abnormality. IMPRESSION: 1. Cardiomegaly with bilateral interstitial and alveolar airspace opacities and trace left pleural effusion. Differential considerations include pulmonary edema versus multilobar pneumonia. Electronically Signed   By: Kathreen Devoid M.D.   On: 04/21/2021 19:31   DG CHEST PORT 1 VIEW  Result Date: 04/21/2021 CLINICAL DATA:  Hypoxia EXAM: PORTABLE CHEST 1 VIEW COMPARISON:  Chest x-ray dated April 20, 2021 FINDINGS: Unchanged cardiomegaly. Bilateral heterogeneous opacities with slightly more focal appearance the right mid lung, likely due to superimposed atelectasis. Small bilateral  pleural effusions. No evidence of pneumothorax. IMPRESSION: Similar bilateral heterogeneous pulmonary opacities and small bilateral pleural effusions.  Electronically Signed   By: Yetta Glassman M.D.   On: 04/21/2021 08:11   DG Chest Port 1 View  Result Date: 04/20/2021 CLINICAL DATA:  Acute respiratory failure EXAM: PORTABLE CHEST 1 VIEW COMPARISON:  04/14/2021 FINDINGS: Bilateral airspace disease again noted, unchanged. Heart is mildly enlarged. Possible small layering left effusion. Scattered aortic calcifications. No acute bony abnormality. IMPRESSION: Diffuse bilateral airspace disease, unchanged. Suspect layering left effusion. Electronically Signed   By: Rolm Baptise M.D.   On: 04/20/2021 08:02   DG Chest Port 1 View  Result Date: 04/14/2021 CLINICAL DATA:  Shortness of breath, history of stroke, diabetes mellitus, hypertension, MI, cancer EXAM: PORTABLE CHEST 1 VIEW COMPARISON:  Portable exam 1340 hours compared to 04/10/2021 FINDINGS: Enlargement of cardiac silhouette with pulmonary vascular congestion. Stable mediastinal contours. BILATERAL pulmonary infiltrates question pulmonary edema, slightly greater at RIGHT base. Small BILATERAL pleural effusions. No pneumothorax or acute osseous findings. IMPRESSION: Enlargement of cardiac silhouette with pulmonary vascular congestion and mild pulmonary edema. Small BILATERAL pleural effusions. Electronically Signed   By: Lavonia Dana M.D.   On: 04/14/2021 16:01   DG CHEST PORT 1 VIEW  Result Date: 04/10/2021 CLINICAL DATA:  Weakness and fatigue. EXAM: PORTABLE CHEST 1 VIEW COMPARISON:  04/09/2021 FINDINGS: 0657 hours. The cardio pericardial silhouette is enlarged. Focal airspace disease again noted right base with some probable atelectasis or infiltrate in the retrocardiac left base. There is pulmonary vascular congestion without overt pulmonary edema. Superimposed component of interstitial pulmonary edema suspected. IMPRESSION: Asymmetric focal  airspace disease right base with some probable atelectasis or infiltrate in the retrocardiac left base. Interstitial edema not excluded. Electronically Signed   By: Misty Stanley M.D.   On: 04/10/2021 07:33   DG Chest Port 1 View  Result Date: 04/09/2021 CLINICAL DATA:  Fever and weakness EXAM: PORTABLE CHEST 1 VIEW COMPARISON:  None. FINDINGS: The heart and mediastinal contours are within normal limits. Aortic calcification. Right lower lung zone focal consolidation. Question retrocardiac opacity. No pulmonary edema. Possible trace left pleural effusion. No right pleural effusion. No pneumothorax. No acute osseous abnormality. IMPRESSION: Right lower lung zone focal consolidation. Question retrocardiac opacity with possible trace left pleural effusion. Findings consistent with infection/inflammation. Followup PA and lateral chest X-ray is recommended in 3-4 weeks following therapy to ensure resolution and exclude underlying malignancy. Electronically Signed   By: Iven Finn M.D.   On: 04/09/2021 23:59   ECHOCARDIOGRAM COMPLETE  Result Date: 04/10/2021    ECHOCARDIOGRAM REPORT   Patient Name:   FULTON Ellinger Date of Exam: 04/10/2021 Medical Rec #:  [id number]    Height:       69.0 in Accession #:    [id number]   Weight:       217.0 lb Date of Birth:  08/22/1960    BSA:          2.139 m Patient Age:    55 years     BP:           142/83 mmHg Patient Gender: M            HR:           85 bpm. Exam Location:  [facility] Procedure: 2D Echo, Cardiac Doppler and Color Doppler Indications:    SBE I33.9  History:        Patient has no prior history of Echocardiogram examinations.                 Previous Myocardial Infarction, Stroke; Risk  Factors:Hypertension and Diabetes. Hx of cancer. 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  Sonographer:    Alvino Chapel RCS Referring Phys: [place]  1. Left ventricular ejection fraction, by estimation, is 55 to  60%. The left ventricle has normal function. The left ventricle has no regional wall motion abnormalities. There is moderate concentric left ventricular hypertrophy. Left ventricular diastolic parameters are consistent with Grade II diastolic dysfunction (pseudonormalization).  2. Right ventricular systolic function is normal. The right ventricular size is normal. There is normal pulmonary artery systolic pressure. The estimated right ventricular systolic pressure is 12.4 mmHg.  3. Left atrial size was moderately dilated.  4. Right atrial size was mildly dilated.  5. The mitral valve is grossly normal. No evidence of mitral valve regurgitation. No evidence of mitral stenosis.  6. The aortic valve is tricuspid. Aortic valve regurgitation is not visualized. No aortic stenosis is present.  7. The inferior vena cava is normal in size with greater than 50% respiratory variability, suggesting right atrial pressure of 3 mmHg. FINDINGS  Left Ventricle: Left ventricular ejection fraction, by estimation, is 55 to 60%. The left ventricle has normal function. The left ventricle has no regional wall motion abnormalities. The left ventricular internal cavity size was normal in size. There is  moderate concentric left ventricular hypertrophy. Left ventricular diastolic parameters are consistent with Grade II diastolic dysfunction (pseudonormalization). Right Ventricle: The right ventricular size is normal. No increase in right ventricular wall thickness. Right ventricular systolic function is normal. There is normal pulmonary artery systolic pressure. The tricuspid regurgitant velocity is 2.10 m/s, and  with an assumed right atrial pressure of 3 mmHg, the estimated right ventricular systolic pressure is 58.0 mmHg. Left Atrium: Left atrial size was moderately dilated. Right Atrium: Right atrial size was mildly dilated. Pericardium: There is no evidence of pericardial effusion. Mitral Valve: The mitral valve is grossly normal. No  evidence of mitral valve regurgitation. No evidence of mitral valve stenosis. Tricuspid Valve: The tricuspid valve is grossly normal. Tricuspid valve regurgitation is trivial. No evidence of tricuspid stenosis. Aortic Valve: The aortic valve is tricuspid. Aortic valve regurgitation is not visualized. No aortic stenosis is present. Pulmonic Valve: The pulmonic valve was grossly normal. Pulmonic valve regurgitation is not visualized. No evidence of pulmonic stenosis. Aorta: The aortic root is normal in size and structure. Venous: The inferior vena cava is normal in size with greater than 50% respiratory variability, suggesting right atrial pressure of 3 mmHg. IAS/Shunts: The atrial septum is grossly normal.  LEFT VENTRICLE PLAX 2D LVIDd:         4.70 cm  Diastology LVIDs:         3.10 cm  LV e' medial:    6.74 cm/s LV PW:         1.50 cm  LV E/e' medial:  16.5 LV IVS:        1.50 cm  LV e' lateral:   9.14 cm/s LVOT diam:     2.00 cm  LV E/e' lateral: 12.1 LV SV:         65 LV SV Index:   31 LVOT Area:     3.14 cm  RIGHT VENTRICLE RV S prime:     13.80 cm/s TAPSE (M-mode): 2.6 cm LEFT ATRIUM             Index  RIGHT ATRIUM           Index LA diam:        4.20 cm 1.96 cm/m  RA Area:     24.30 cm LA Vol (A2C):   79.4 ml 37.12 ml/m RA Volume:   81.40 ml  38.06 ml/m LA Vol (A4C):   93.6 ml 43.76 ml/m LA Biplane Vol: 86.2 ml 40.30 ml/m  AORTIC VALVE LVOT Vmax:   102.00 cm/s LVOT Vmean:  62.500 cm/s LVOT VTI:    0.208 m  AORTA Ao Root diam: 3.60 cm MITRAL VALVE                TRICUSPID VALVE MV Area (PHT): 4.15 cm     TR Peak grad:   17.6 mmHg MV Decel Time: 183 msec     TR Vmax:        210.00 cm/s MV E velocity: 111.00 cm/s MV A velocity: 79.70 cm/s   SHUNTS MV E/A ratio:  1.39         Systemic VTI:  0.21 m                             Systemic Diam: 2.00 cm Eleonore Chiquito MD Electronically signed by Eleonore Chiquito MD Signature Date/Time: 04/10/2021/1:25:55 PM    Final    [place] EKG SITE RITE  Result Date:  04/17/2021 If Site Rite image not attached, placement could not be confirmed due to current cardiac rhythm.   Barton Dubois, MD  [facility]  If 7PM-7AM, please contact night-coverage [email or web address]  Password [email or web address] 04/23/2021, 5:02 PM   LOS: 13 days

## 2021-04-23 NOTE — Care Management Important Message (Signed)
Important Message  Patient Details  Name: Keith Snow MRN: [id number] Date of Birth: 01-04-1961   Medicare Important Message Given:  Yes     [name] 04/23/2021, 12:30 PM
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
## 2021-04-23 NOTE — Plan of Care (Signed)
  Problem: Education: Goal: Knowledge of General Education information will improve Description: Including pain rating scale, medication(s)/side effects and non-pharmacologic comfort measures Outcome: Not Progressing   Problem: Health Behavior/Discharge Planning: Goal: Ability to manage health-related needs will improve Outcome: Not Progressing   Problem: Clinical Measurements: Goal: Ability to maintain clinical measurements within normal limits will improve Outcome: Not Progressing Goal: Will remain free from infection Outcome: Not Progressing Goal: Diagnostic test results will improve Outcome: Not Progressing Goal: Respiratory complications will improve Outcome: Not Progressing Goal: Cardiovascular complication will be avoided Outcome: Not Progressing   Problem: Nutrition: Goal: Adequate nutrition will be maintained Outcome: Not Progressing   Problem: Activity: Goal: Risk for activity intolerance will decrease Outcome: Not Progressing   Problem: Coping: Goal: Level of anxiety will decrease Outcome: Not Progressing   Problem: Elimination: Goal: Will not experience complications related to bowel motility Outcome: Not Progressing Goal: Will not experience complications related to urinary retention Outcome: Not Progressing   Problem: Pain Managment: Goal: General experience of comfort will improve Outcome: Not Progressing   Problem: Safety: Goal: Ability to remain free from injury will improve Outcome: Not Progressing   Problem: Skin Integrity: Goal: Risk for impaired skin integrity will decrease Outcome: Not Progressing

## 2021-04-24 DIAGNOSIS — J9601 Acute respiratory failure with hypoxia: Secondary | ICD-10-CM | POA: Diagnosis not present

## 2021-04-24 DIAGNOSIS — N179 Acute kidney failure, unspecified: Secondary | ICD-10-CM | POA: Diagnosis not present

## 2021-04-24 DIAGNOSIS — I5033 Acute on chronic diastolic (congestive) heart failure: Secondary | ICD-10-CM | POA: Diagnosis not present

## 2021-04-24 LAB — RENAL FUNCTION PANEL
Albumin: 2.6 g/dL — ABNORMAL LOW (ref 3.5–5.0)
Anion gap: 12 (ref 5–15)
BUN: 111 mg/dL — ABNORMAL HIGH (ref 6–20)
CO2: 20 mmol/L — ABNORMAL LOW (ref 22–32)
Calcium: 8.1 mg/dL — ABNORMAL LOW (ref 8.9–10.3)
Chloride: 104 mmol/L (ref 98–111)
Creatinine, Ser: 2.32 mg/dL — ABNORMAL HIGH (ref 0.61–1.24)
GFR, Estimated: 32 mL/min — ABNORMAL LOW (ref >60)
Glucose, Bld: 234 mg/dL — ABNORMAL HIGH (ref 70–99)
Phosphorus: 4.3 mg/dL (ref 2.5–4.6)
Potassium: 4.2 mmol/L (ref 3.5–5.1)
Sodium: 136 mmol/L (ref 135–145)

## 2021-04-24 LAB — GLUCOSE, CAPILLARY
Glucose-Capillary: 248 mg/dL — ABNORMAL HIGH (ref 70–99)
Glucose-Capillary: 244 mg/dL — ABNORMAL HIGH (ref 70–99)
Glucose-Capillary: 273 mg/dL — ABNORMAL HIGH (ref 70–99)
Glucose-Capillary: 251 mg/dL — ABNORMAL HIGH (ref 70–99)

## 2021-04-24 MED ORDER — INSULIN ASPART 100 UNIT/ML IJ SOLN
5.0000 [IU] | Freq: Three times a day (TID) | INTRAMUSCULAR | Status: DC
  Administered 2021-04-24 – 2021-04-27 (×10): 5 [IU] via SUBCUTANEOUS

## 2021-04-24 MED ORDER — INSULIN GLARGINE-YFGN 100 UNIT/ML ~~LOC~~ SOLN
45.0000 [IU] | Freq: Every day | SUBCUTANEOUS | Status: DC
  Administered 2021-04-24 – 2021-04-26 (×3): 45 [IU] via SUBCUTANEOUS
  Filled 2021-04-24 (×4): qty 0.45

## 2021-04-24 NOTE — Plan of Care (Signed)

## 2021-04-24 NOTE — Progress Notes (Signed)
PROGRESS NOTE  Keith Snow DXI:[id number] DOB: 1960-12-29 DOA: 04/09/2021 PCP: System, Provider Not In   Brief History:  60 year old with history of DM2, HTN, MI, CHF comes to the ED with change in mental status.  Upon admission he was noted to have focal consolidation in the right lower lung, elevated BNP.  He was also hyperglycemic.  CT of the chest showed combination of severe bilateral bronchopneumonia and possible effusion.  It has been difficult to diurese him due to worsening renal function.  Nephrology and Pulm consulted for their input. Started on Broad Spectrum Abx and Steroids. Cr continues to increase slowly.    Assessment/Plan: Acute hypoxic respiratory failure requiring BiPAP and HiFlow - Multifactorial.  Combination of PNA, COPD and CHF exacerbation. -9/19 CT chest Severe b/l BronchoPNA with small b/l parapneumonic effusions.  -pt had been weaning down to 4-5 L HFNC but on 9/27-->increase SOB and back on BiPAP -9/27 CXR personally reviewed-unchanged diffuse bilateral pulm infiltrates -Still requiring high flow nasal cannula supplementation with intermittent use of BiPAP.  Continue medications and treatment as specified below -Appreciate assistance by pulmonology service, nephrology and cardiology service. -Patient continue slowly improving.   Community-acquired pneumonia. Aspiration PNA?  Concerns for BOOP - Pro-Cal 0.54>>0.34  -Cleared by speech and swallow for regular diet. - Currently on IV Zosyn 7/20>> - Bronchodilators scheduled and as needed.  I-S/flutter -Speech and swallow eval-regular diet -MRSA neg -9/26 discussed with Pulm, Dr. Alfonzo Beers to po steroids -patient has finished 7 days zosyn; remained afebrile. -Since 04/20/2021 following pulmonology service patient is steroids has been restarted through his pains at a higher dose. Planning for start slow tapering once hypoxia further improved (O2 supplementation weaned down to 4L) -Continue to follow  response and further recommendations by pulmonology service. -Patient has continue to demonstrate improvement slowly.     Diabetes mellitus type 2, uncontrolled with hyperglycemia - 9/17 A1c-10.3 - Continue current dose of long-acting insulin and continue sliding scale -Continue to follow CBGs -Modify carbohydrate diet has been recommended.   Acute on chronic congestive heart failure with preserved EF, grade 2 DD, class III -04/10/21 Echo 55-60%, grade 2 DD, moderate LVH - Lasix IV by nephrology>>transition to 1/2 home dose bumex -9/26 discussed with renal--Dr. Joelyn Oms -previously on Entresto, spironolactone which were held due to AKI. Per nephrology will resume spironolactone. -Continue to follow I's and O's -Continue low-sodium diet. -Patient expressed good urine output.   Acute kidney injury on CKD stage IIIa - Admission creatinine 1.8, 2.63 > 2.71 > 3.17 >3.49 >3.84>>3.04>>>2.32 -Currently producing about 1.5 L of urine over last 24 hours.   -BUN rising as well but this could also be secondary to steroids.  Potassium and bicarb are relatively stable.   -Renal ultrasound is negative for acute pathology.   -appreciate renal follow up and recommendations. -he does not look to be florridly fluid overloaded -previously on Bumex 2 mg bid when he lived in [place] -Volume status has remained stable -Continue the use of current dose of Bumex and spironolactone as recommended by nephrology..   Acute metabolic encephalopathy -Resolved.  -Mentation back to baseline. -Patient is oriented x3 and following commands appropriately.   Essential hypertension - Continue the use of Coreg,  hydralazine, clonidine, imdur and amlodipine. -nifedipine stopped due to concerns for shunt -Continue to follow vital signs and further adjust antihypertensive treatment as needed -Following nephrology service recommendation and spironolactone has been added.  Hyperlipidemia - Continue the use of Crestor  Status is: Inpatient   Remains inpatient appropriate because:Inpatient level of care appropriate due to severity of illness   Dispo: The patient is from: Home              Anticipated d/c is to: Home              Patient currently is not medically stable to d/c.  Making progress and hopefully able to be transferred outside the unit to telemetry bed on 10-22.              Difficult to place patient No          Family Communication:   Wife updated at bedside.   Consultants:  pulm   Code Status:  FULL    DVT Prophylaxis:  Ravalli Heparin     Procedures: As Listed in Progress Note Above   Antibiotics: Zosyn 9/20>>9/27     Subjective: Patient reports breathing has continue improving; no chest pain, no nausea, no vomiting.  Good urine output.  Still using BiPAP nightly (as he is supposed to be doing at nighttime); today demonstrating good saturation at rest with 4-5 L supplementation.  Objective: Vitals:   04/24/21 0812 04/24/21 0817 04/24/21 0914 04/24/21 0943  BP: (!) 182/92   (!) 171/99  Pulse:      Resp:      Temp:      TempSrc:      SpO2:  100% 100%   Weight:      Height:        Intake/Output Summary (Last 24 hours) at 04/24/2021 1030 Last data filed at 04/24/2021 0248 Gross per 24 hour  Intake 960 ml  Output 1400 ml  Net -440 ml   Weight change: 0.5 kg  Exam: General exam: Alert, awake, oriented x 3; no chest pain, no nausea, no vomiting.  No acute distress.  Reports breathing is improving. Respiratory system: 4-5 L high flow nasal cannula supplementation in place.  No using accessory muscles.  Positive expiratory wheezing and fine crackles at the bases appreciated. Cardiovascular system:RRR. No murmurs, rubs, gallops.  No JVD. Gastrointestinal system: Abdomen is nondistended, soft and nontender. No organomegaly or masses felt. Normal bowel sounds heard. Central nervous system: Alert and oriented. No focal neurological deficits. Extremities: No cyanosis or  clubbing; trace edema appreciated bilaterally. Skin: No petechiae. Psychiatry: Judgement and insight appear normal. Mood & affect appropriate.   Data Reviewed: I have personally reviewed following labs and imaging studies  Basic Metabolic Panel: Recent Labs  Lab 04/20/21 0500 04/21/21 0443 04/22/21 0449 04/23/21 0433 04/24/21 0453  NA 137 137 137 134* 136  K 3.3* 3.9 4.1 4.7 4.2  CL 106 107 105 102 104  CO2 21* 20* 21* 22 20*  GLUCOSE 64* 162* 222* 345* 234*  BUN 104* 104* 108* 108* 111*  CREATININE 2.84* 2.55* 2.57* 2.54* 2.32*  CALCIUM 7.4* 7.5* 7.5* 7.7* 8.1*  PHOS  --   --  4.8* 4.7* 4.3   Liver Function Tests: Recent Labs  Lab 04/18/21 0403 04/19/21 0405 04/21/21 0443 04/22/21 0449 04/23/21 0433 04/24/21 0453  AST 11* 11* 20  --   --   --   ALT 17 16 24   --   --   --   ALKPHOS 75 65 73  --   --   --   BILITOT 0.4 0.7 0.6  --   --   --   PROT 6.0* 6.2* 6.3*  --   --   --  ALBUMIN 2.2* 2.3* 2.4* 2.4* 2.4* 2.6*   CBC: Recent Labs  Lab 04/18/21 0403 04/19/21 0405 04/21/21 0443  WBC 9.0 9.4 10.8*  HGB 8.4* 8.8* 8.7*  HCT 26.0* 27.5* 27.3*  MCV 94.5 93.5 95.8  PLT 251 267 276    CBG: Recent Labs  Lab 04/23/21 0755 04/23/21 1138 04/23/21 1600 04/23/21 2132 04/24/21 0757  GLUCAP 323* 258* 267* 262* 248*   Urine analysis:    Component Value Date/Time   COLORURINE YELLOW 04/14/2021 1803   APPEARANCEUR CLEAR 04/14/2021 1803   LABSPEC >1.030 (H) 04/14/2021 1803   PHURINE 5.0 04/14/2021 1803   GLUCOSEU NEGATIVE 04/14/2021 1803   HGBUR NEGATIVE 04/14/2021 1803   BILIRUBINUR NEGATIVE 04/14/2021 1803   KETONESUR NEGATIVE 04/14/2021 1803   PROTEINUR TRACE (A) 04/14/2021 1803   NITRITE NEGATIVE 04/14/2021 1803   LEUKOCYTESUR NEGATIVE 04/14/2021 1803   Recent Results (from the past 240 hour(s))  Expectorated Sputum Assessment w Gram Stain, Rflx to Resp Cult     Status: None   Collection Time: 04/16/21  7:00 PM   Specimen: Sputum  Result Value Ref  Range Status   Specimen Description SPUTUM  Final   Special Requests NONE  Final   Sputum evaluation   Final    THIS SPECIMEN IS ACCEPTABLE FOR SPUTUM CULTURE PERFORMED AT [facility] Performed at [facility], [address] [place], [place] [address]    Report Status 04/16/2021 FINAL  Final  Culture, Respiratory w Gram Stain     Status: None   Collection Time: 04/16/21  7:00 PM   Specimen: SPU  Result Value Ref Range Status   Specimen Description   Final    SPUTUM Performed at [facility], [address], [place], [place] [address]    Special Requests   Final    NONE Reflexed from [id number] Performed at [facility], [address], [place], [place] [address]    Gram Stain   Final    NO WBC SEEN RARE SQUAMOUS EPITHELIAL CELLS PRESENT RARE GRAM POSITIVE COCCI RARE YEAST    Culture   Final    FEW Normal respiratory flora-no Staph aureus or Pseudomonas seen Performed at [facility], [place] [address] [place], [place] [address]    Report Status 04/19/2021 FINAL  Final     Scheduled Meds:  amLODipine  10 mg Oral Daily   aspirin EC  81 mg Oral Daily   budesonide (PULMICORT) nebulizer solution  0.5 mg Nebulization BID   bumetanide  3 mg Oral BID   buPROPion  100 mg Oral BID   carvedilol  25 mg Oral BID WC   Chlorhexidine Gluconate Cloth  6 each Topical Daily   cloNIDine  0.3 mg Oral TID   ferrous sulfate  325 mg Oral Q breakfast   heparin  5,000 Units Subcutaneous Q8H   hydrALAZINE  100 mg Oral Q8H   insulin aspart  0-5 Units Subcutaneous QHS   insulin aspart  0-9 Units Subcutaneous TID WC   insulin aspart  3 Units Subcutaneous TID WC   insulin glargine-yfgn  40 Units Subcutaneous QHS   ipratropium-albuterol  3 mL Nebulization Q6H   isosorbide mononitrate  30 mg Oral BID   mouth rinse  15 mL Mouth Rinse BID   methylPREDNISolone (SOLU-MEDROL) injection  40 mg Intravenous Q12H   pantoprazole  40 mg Oral Daily   rosuvastatin  20 mg Oral Daily   spironolactone  25 mg Oral  Daily   Continuous Infusions:  ferumoxytol  Procedures/Studies: CT CHEST WO CONTRAST  Result Date: 04/12/2021 CLINICAL DATA:  60 year old male with history of acute hypoxic respiratory failure requiring BiPAP. EXAM: CT CHEST WITHOUT CONTRAST TECHNIQUE: Multidetector CT imaging of the chest was performed following the standard protocol without IV contrast. COMPARISON:  No priors. FINDINGS: Cardiovascular: Heart size is normal. There is no significant pericardial fluid, thickening or pericardial calcification. There is aortic atherosclerosis, as well as atherosclerosis of the great vessels of the mediastinum and the coronary arteries, including calcified atherosclerotic plaque in the left anterior descending and left circumflex coronary arteries. Mediastinum/Nodes: Multiple prominent borderline enlarged mediastinal and bilateral hilar lymph nodes are noted, nonspecific and likely reactive. Esophagus is unremarkable in appearance. No axillary lymphadenopathy. Lungs/Pleura: Small bilateral pleural effusions (right greater than left) lying dependently. Patchy multifocal airspace consolidation most evident in a peribronchovascular distribution with surrounding ground-glass attenuation and septal thickening. Upper Abdomen: Unremarkable. Musculoskeletal: There are no aggressive appearing lytic or blastic lesions noted in the visualized portions of the skeleton. Multiple old healed rib fractures. IMPRESSION: 1. Severe multilobar bilateral bronchopneumonia with small bilateral parapneumonic pleural effusions lying dependently, as above. 2. Aortic atherosclerosis, in addition to 2 vessel coronary artery disease. Please note that although the presence of coronary artery calcium documents the presence of coronary artery disease, the severity of this disease and any potential stenosis cannot be assessed on this non-gated CT examination. Assessment for potential risk factor modification, dietary therapy or  pharmacologic therapy may be warranted, if clinically indicated. Aortic Atherosclerosis (ICD10-I70.0). Electronically Signed   By: Vinnie Langton M.D.   On: 04/12/2021 18:58   US RENAL  Result Date: 04/14/2021 CLINICAL DATA:  Acute renal injury EXAM: RENAL / URINARY TRACT ULTRASOUND COMPLETE COMPARISON:  None. FINDINGS: Right Kidney: Renal measurements: 12.3 x 4.5 x 5.7 cm. = volume: 165 mL. Echogenicity within normal limits. No mass or hydronephrosis visualized. Left Kidney: Renal measurements: 10.9 x 6.0 x 4.9 cm. = volume: 66 mL. Echogenicity within normal limits. No mass or hydronephrosis visualized. Bladder: Bladder is well distended. Mild wall thickening is noted anteriorly measuring up to 9 mm. This could be related to underlying UTI. Clinical correlation is recommended. Other: None. IMPRESSION: Normal-appearing kidneys bilaterally. Mild bladder wall thickening which may be inflammatory in nature. Clinical correlation is recommended. Electronically Signed   By: Inez Catalina M.D.   On: 04/14/2021 15:54   DG Chest Port 1 View  Result Date: 04/21/2021 CLINICAL DATA:  Shortness of breath EXAM: PORTABLE CHEST 1 VIEW COMPARISON:  04/21/2021 FINDINGS: Bilateral interstitial and patchy alveolar airspace opacities. Small left pleural effusion. No pneumothorax. Stable cardiomegaly. No acute osseous abnormality. IMPRESSION: 1. Cardiomegaly with bilateral interstitial and alveolar airspace opacities and trace left pleural effusion. Differential considerations include pulmonary edema versus multilobar pneumonia. Electronically Signed   By: Kathreen Devoid M.D.   On: 04/21/2021 19:31   DG CHEST PORT 1 VIEW  Result Date: 04/21/2021 CLINICAL DATA:  Hypoxia EXAM: PORTABLE CHEST 1 VIEW COMPARISON:  Chest x-ray dated April 20, 2021 FINDINGS: Unchanged cardiomegaly. Bilateral heterogeneous opacities with slightly more focal appearance the right mid lung, likely due to superimposed atelectasis. Small bilateral  pleural effusions. No evidence of pneumothorax. IMPRESSION: Similar bilateral heterogeneous pulmonary opacities and small bilateral pleural effusions. Electronically Signed   By: Yetta Glassman M.D.   On: 04/21/2021 08:11   DG Chest Port 1 View  Result Date: 04/20/2021 CLINICAL DATA:  Acute respiratory failure EXAM: PORTABLE CHEST 1 VIEW COMPARISON:  04/14/2021 FINDINGS: Bilateral airspace disease again noted, unchanged.  Heart is mildly enlarged. Possible small layering left effusion. Scattered aortic calcifications. No acute bony abnormality. IMPRESSION: Diffuse bilateral airspace disease, unchanged. Suspect layering left effusion. Electronically Signed   By: Rolm Baptise M.D.   On: 04/20/2021 08:02   DG Chest Port 1 View  Result Date: 04/14/2021 CLINICAL DATA:  Shortness of breath, history of stroke, diabetes mellitus, hypertension, MI, cancer EXAM: PORTABLE CHEST 1 VIEW COMPARISON:  Portable exam 1340 hours compared to 04/10/2021 FINDINGS: Enlargement of cardiac silhouette with pulmonary vascular congestion. Stable mediastinal contours. BILATERAL pulmonary infiltrates question pulmonary edema, slightly greater at RIGHT base. Small BILATERAL pleural effusions. No pneumothorax or acute osseous findings. IMPRESSION: Enlargement of cardiac silhouette with pulmonary vascular congestion and mild pulmonary edema. Small BILATERAL pleural effusions. Electronically Signed   By: Lavonia Dana M.D.   On: 04/14/2021 16:01   DG CHEST PORT 1 VIEW  Result Date: 04/10/2021 CLINICAL DATA:  Weakness and fatigue. EXAM: PORTABLE CHEST 1 VIEW COMPARISON:  04/09/2021 FINDINGS: 0657 hours. The cardio pericardial silhouette is enlarged. Focal airspace disease again noted right base with some probable atelectasis or infiltrate in the retrocardiac left base. There is pulmonary vascular congestion without overt pulmonary edema. Superimposed component of interstitial pulmonary edema suspected. IMPRESSION: Asymmetric focal  airspace disease right base with some probable atelectasis or infiltrate in the retrocardiac left base. Interstitial edema not excluded. Electronically Signed   By: Misty Stanley M.D.   On: 04/10/2021 07:33   DG Chest Port 1 View  Result Date: 04/09/2021 CLINICAL DATA:  Fever and weakness EXAM: PORTABLE CHEST 1 VIEW COMPARISON:  None. FINDINGS: The heart and mediastinal contours are within normal limits. Aortic calcification. Right lower lung zone focal consolidation. Question retrocardiac opacity. No pulmonary edema. Possible trace left pleural effusion. No right pleural effusion. No pneumothorax. No acute osseous abnormality. IMPRESSION: Right lower lung zone focal consolidation. Question retrocardiac opacity with possible trace left pleural effusion. Findings consistent with infection/inflammation. Followup PA and lateral chest X-ray is recommended in 3-4 weeks following therapy to ensure resolution and exclude underlying malignancy. Electronically Signed   By: Iven Finn M.D.   On: 04/09/2021 23:59   ECHOCARDIOGRAM COMPLETE  Result Date: 04/10/2021    ECHOCARDIOGRAM REPORT   Patient Name:   Keith Snow Date of Exam: 04/10/2021 Medical Rec #:  [id number]    Height:       69.0 in Accession #:    [id number]   Weight:       217.0 lb Date of Birth:  1961-06-14    BSA:          2.139 m Patient Age:    64 years     BP:           142/83 mmHg Patient Gender: M            HR:           85 bpm. Exam Location:  [facility] Procedure: 2D Echo, Cardiac Doppler and Color Doppler Indications:    SBE I33.9  History:        Patient has no prior history of Echocardiogram examinations.                 Previous Myocardial Infarction, Stroke; Risk                 Factors:Hypertension and Diabetes. Hx of cancer. 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  Sonographer:  Alvino Chapel RCS Referring Phys: [id number] SEYED A SHAHMEHDI IMPRESSIONS  1. Left ventricular ejection fraction, by estimation, is 55 to  60%. The left ventricle has normal function. The left ventricle has no regional wall motion abnormalities. There is moderate concentric left ventricular hypertrophy. Left ventricular diastolic parameters are consistent with Grade II diastolic dysfunction (pseudonormalization).  2. Right ventricular systolic function is normal. The right ventricular size is normal. There is normal pulmonary artery systolic pressure. The estimated right ventricular systolic pressure is 38.7 mmHg.  3. Left atrial size was moderately dilated.  4. Right atrial size was mildly dilated.  5. The mitral valve is grossly normal. No evidence of mitral valve regurgitation. No evidence of mitral stenosis.  6. The aortic valve is tricuspid. Aortic valve regurgitation is not visualized. No aortic stenosis is present.  7. The inferior vena cava is normal in size with greater than 50% respiratory variability, suggesting right atrial pressure of 3 mmHg. FINDINGS  Left Ventricle: Left ventricular ejection fraction, by estimation, is 55 to 60%. The left ventricle has normal function. The left ventricle has no regional wall motion abnormalities. The left ventricular internal cavity size was normal in size. There is  moderate concentric left ventricular hypertrophy. Left ventricular diastolic parameters are consistent with Grade II diastolic dysfunction (pseudonormalization). Right Ventricle: The right ventricular size is normal. No increase in right ventricular wall thickness. Right ventricular systolic function is normal. There is normal pulmonary artery systolic pressure. The tricuspid regurgitant velocity is 2.10 m/s, and  with an assumed right atrial pressure of 3 mmHg, the estimated right ventricular systolic pressure is 56.4 mmHg. Left Atrium: Left atrial size was moderately dilated. Right Atrium: Right atrial size was mildly dilated. Pericardium: There is no evidence of pericardial effusion. Mitral Valve: The mitral valve is grossly normal. No  evidence of mitral valve regurgitation. No evidence of mitral valve stenosis. Tricuspid Valve: The tricuspid valve is grossly normal. Tricuspid valve regurgitation is trivial. No evidence of tricuspid stenosis. Aortic Valve: The aortic valve is tricuspid. Aortic valve regurgitation is not visualized. No aortic stenosis is present. Pulmonic Valve: The pulmonic valve was grossly normal. Pulmonic valve regurgitation is not visualized. No evidence of pulmonic stenosis. Aorta: The aortic root is normal in size and structure. Venous: The inferior vena cava is normal in size with greater than 50% respiratory variability, suggesting right atrial pressure of 3 mmHg. IAS/Shunts: The atrial septum is grossly normal.  LEFT VENTRICLE PLAX 2D LVIDd:         4.70 cm  Diastology LVIDs:         3.10 cm  LV e' medial:    6.74 cm/s LV PW:         1.50 cm  LV E/e' medial:  16.5 LV IVS:        1.50 cm  LV e' lateral:   9.14 cm/s LVOT diam:     2.00 cm  LV E/e' lateral: 12.1 LV SV:         65 LV SV Index:   31 LVOT Area:     3.14 cm  RIGHT VENTRICLE RV S prime:     13.80 cm/s TAPSE (M-mode): 2.6 cm LEFT ATRIUM             Index       RIGHT ATRIUM           Index LA diam:        4.20 cm 1.96 cm/m  RA Area:     24.30  cm LA Vol (A2C):   79.4 ml 37.12 ml/m RA Volume:   81.40 ml  38.06 ml/m LA Vol (A4C):   93.6 ml 43.76 ml/m LA Biplane Vol: 86.2 ml 40.30 ml/m  AORTIC VALVE LVOT Vmax:   102.00 cm/s LVOT Vmean:  62.500 cm/s LVOT VTI:    0.208 m  AORTA Ao Root diam: 3.60 cm MITRAL VALVE                TRICUSPID VALVE MV Area (PHT): 4.15 cm     TR Peak grad:   17.6 mmHg MV Decel Time: 183 msec     TR Vmax:        210.00 cm/s MV E velocity: 111.00 cm/s MV A velocity: 79.70 cm/s   SHUNTS MV E/A ratio:  1.39         Systemic VTI:  0.21 m                             Systemic Diam: 2.00 cm Eleonore Chiquito MD Electronically signed by Eleonore Chiquito MD Signature Date/Time: 04/10/2021/1:25:55 PM    Final    [place] EKG SITE RITE  Result Date:  04/17/2021 If Site Rite image not attached, placement could not be confirmed due to current cardiac rhythm.   Barton Dubois, MD  [facility]  If 7PM-7AM, please contact night-coverage [email or web address]  Password [email or web address] 04/24/2021, 10:30 AM   LOS: 14 days

## 2021-04-24 NOTE — Progress Notes (Signed)
Patient ID: Keith Snow, male   DOB: [birth year], 60 y.o.   MRN: [id number] [facility] Progress Note   Assessment/ Plan:   1. Acute kidney Injury on chronic kidney disease stage III: Baseline creatinine appears to range 1.7-2.3 and acute kidney injury likely associated with exacerbation of congestive heart failure.  Renal function appears to be gradually improving with ongoing diuresis and off from SGLT2 inhibitor/RAAS blockade.  He is on bumetanide and his spironolactone was restarted.  If renal function continues to show improvement within the next 24 hours, would be stable from a renal standpoint to discharge him home for ongoing outpatient follow-up with his nephrologist in [place].  Although he has azotemia, he does not have any signs or symptoms of uremia. 2.  Chronic hypoxic respiratory failure/COPD: He has currently been weaned back down to oxygen via nasal cannula at 4 L a minute which he apparently has been on at home as well of the past 2 years. 3.  Exacerbation of congestive heart failure: Ongoing diuresis with bumetanide and spironolactone with gradual improvement of volume status and slow renal recovery.  Continue to hold RAS blocker/SGLT2 inhibitor until seen by his PCP/nephrologist as an outpatient. 4.  Anemia of chronic disease: Status post intravenous iron and ESA. 5.  Type 2 diabetes mellitus: With poorly controlled diabetes as an outpatient and ongoing management of hyperglycemia here.  Subjective:   Reports to be feeling better with decreased leg swelling and inquires about going home.   Objective:   BP 109/77   Pulse 65   Temp (!) 97.5 F (36.4 C) (Oral)   Resp 16   Ht 5\' 9"  (1.753 m)   Wt 109.2 kg   SpO2 95%   BMI 35.55 kg/m   Intake/Output Summary (Last 24 hours) at 04/24/2021 1329 Last data filed at 04/24/2021 0248 Gross per 24 hour  Intake 480 ml  Output 1400 ml  Net -920 ml   Weight change: 0.5 kg  Physical Exam: Gen: Comfortably sitting up  in bed, sleeping and awakens easily to conversation. CVS: Pulse regular rhythm, normal rate, S1 and S2 normal Resp: Fine rales bilaterally over both posterior lung fields, no wheeze/rhonchi Abd: Soft, obese, nontender Ext: 2+ bilateral pitting lower extremity edema  Imaging: No results found.  Labs: BMET Recent Labs  Lab 04/18/21 0403 04/19/21 0405 04/20/21 0500 04/21/21 0443 04/22/21 0449 04/23/21 0433 04/24/21 0453  NA 137 135 137 137 137 134* 136  K 3.6 3.7 3.3* 3.9 4.1 4.7 4.2  CL 104 104 106 107 105 102 104  CO2 24 22 21* 20* 21* 22 20*  GLUCOSE 124* 206* 64* 162* 222* 345* 234*  BUN 111* 112* 104* 104* 108* 108* 111*  CREATININE 3.04* 3.09* 2.84* 2.55* 2.57* 2.54* 2.32*  CALCIUM 7.2* 7.3* 7.4* 7.5* 7.5* 7.7* 8.1*  PHOS  --   --   --   --  4.8* 4.7* 4.3   CBC Recent Labs  Lab 04/18/21 0403 04/19/21 0405 04/21/21 0443  WBC 9.0 9.4 10.8*  HGB 8.4* 8.8* 8.7*  HCT 26.0* 27.5* 27.3*  MCV 94.5 93.5 95.8  PLT 251 267 276    Medications:     amLODipine  10 mg Oral Daily   aspirin EC  81 mg Oral Daily   budesonide (PULMICORT) nebulizer solution  0.5 mg Nebulization BID   bumetanide  3 mg Oral BID   buPROPion  100 mg Oral BID   carvedilol  25 mg Oral BID WC   Chlorhexidine  Gluconate Cloth  6 each Topical Daily   cloNIDine  0.3 mg Oral TID   ferrous sulfate  325 mg Oral Q breakfast   heparin  5,000 Units Subcutaneous Q8H   hydrALAZINE  100 mg Oral Q8H   insulin aspart  0-5 Units Subcutaneous QHS   insulin aspart  0-9 Units Subcutaneous TID WC   insulin aspart  5 Units Subcutaneous TID WC   insulin glargine-yfgn  45 Units Subcutaneous QHS   ipratropium-albuterol  3 mL Nebulization Q6H   isosorbide mononitrate  30 mg Oral BID   mouth rinse  15 mL Mouth Rinse BID   methylPREDNISolone (SOLU-MEDROL) injection  40 mg Intravenous Q12H   pantoprazole  40 mg Oral Daily   rosuvastatin  20 mg Oral Daily   spironolactone  25 mg Oral Daily   Elmarie Shiley, MD 04/24/2021,  1:29 PM

## 2021-04-25 DIAGNOSIS — I5033 Acute on chronic diastolic (congestive) heart failure: Secondary | ICD-10-CM | POA: Diagnosis not present

## 2021-04-25 DIAGNOSIS — J9601 Acute respiratory failure with hypoxia: Secondary | ICD-10-CM | POA: Diagnosis not present

## 2021-04-25 DIAGNOSIS — N179 Acute kidney failure, unspecified: Secondary | ICD-10-CM | POA: Diagnosis not present

## 2021-04-25 LAB — RENAL FUNCTION PANEL
Albumin: 2.5 g/dL — ABNORMAL LOW (ref 3.5–5.0)
Anion gap: 10 (ref 5–15)
BUN: 104 mg/dL — ABNORMAL HIGH (ref 6–20)
CO2: 22 mmol/L (ref 22–32)
Calcium: 8.1 mg/dL — ABNORMAL LOW (ref 8.9–10.3)
Chloride: 103 mmol/L (ref 98–111)
Creatinine, Ser: 2.35 mg/dL — ABNORMAL HIGH (ref 0.61–1.24)
GFR, Estimated: 31 mL/min — ABNORMAL LOW (ref >60)
Glucose, Bld: 309 mg/dL — ABNORMAL HIGH (ref 70–99)
Phosphorus: 3.9 mg/dL (ref 2.5–4.6)
Potassium: 4.2 mmol/L (ref 3.5–5.1)
Sodium: 135 mmol/L (ref 135–145)

## 2021-04-25 LAB — GLUCOSE, CAPILLARY
Glucose-Capillary: 258 mg/dL — ABNORMAL HIGH (ref 70–99)
Glucose-Capillary: 302 mg/dL — ABNORMAL HIGH (ref 70–99)
Glucose-Capillary: 263 mg/dL — ABNORMAL HIGH (ref 70–99)
Glucose-Capillary: 236 mg/dL — ABNORMAL HIGH (ref 70–99)

## 2021-04-25 NOTE — Plan of Care (Signed)

## 2021-04-25 NOTE — Progress Notes (Signed)
PROGRESS NOTE  Keith Snow YPP:[id number] DOB: January 23, 1961 DOA: 04/09/2021 PCP: System, Provider Not In   Brief History:  60 year old with history of DM2, HTN, MI, CHF comes to the ED with change in mental status.  Upon admission he was noted to have focal consolidation in the right lower lung, elevated BNP.  He was also hyperglycemic.  CT of the chest showed combination of severe bilateral bronchopneumonia and possible effusion.  It has been difficult to diurese him due to worsening renal function.  Nephrology and Pulm consulted for their input. Started on [employer] Spectrum Abx and Steroids. Cr continues to increase slowly.    Assessment/Plan: Acute hypoxic respiratory failure requiring BiPAP and HiFlow - Multifactorial.  Combination of PNA, COPD and CHF exacerbation. -9/19 CT chest Severe b/l BronchoPNA with small b/l parapneumonic effusions.  -9/27 CXR personally reviewed-unchanged diffuse bilateral pulm infiltrates -Still requiring high flow nasal cannula supplementation with intermittent use of BiPAP.  Continue medications and treatment as specified below -Appreciate assistance by pulmonology service, nephrology and cardiology service. -Patient continue slowly improving. -Continue current treatment.   Community-acquired pneumonia. Aspiration PNA?  Concerns for BOOP - Pro-Cal 0.54>>0.34  -Cleared by speech and swallow for regular diet. - Currently on IV Zosyn 7/20>> - Bronchodilators scheduled and as needed.  I-S/flutter -Speech and swallow eval-regular diet -MRSA neg -9/26 discussed with Pulm, Dr. Alfonzo Beers to po steroids -patient has finished 7 days zosyn; remained afebrile. -Since 04/20/2021 following pulmonology service patient is steroids has been restarted through his pains at a higher dose. Planning for start slow tapering once hypoxia further improved (O2 supplementation weaned down to 4-5L) -Continue to follow response and further recommendations by pulmonology  service. -Patient has continue to demonstrate improvement slowly.     Diabetes mellitus type 2, uncontrolled with hyperglycemia - 9/17 A1c-10.3 - Continue current dose of long-acting insulin and continue sliding scale -Continue to follow CBGs -Modify carbohydrate diet has been recommended.   Acute on chronic congestive heart failure with preserved EF, grade 2 DD, class III -04/10/21 Echo 55-60%, grade 2 DD, moderate LVH - Lasix IV by nephrology>>transition to 1/2 home dose bumex -9/26 discussed with renal--Dr. Joelyn Oms -previously on Entresto, spironolactone which were held due to AKI. Per nephrology will resume spironolactone. -Continue to follow I's and O's -Continue low-sodium diet. -Patient expressed good urine output.   Acute kidney injury on CKD stage IIIa - Admission creatinine 1.8, 2.63 > 2.71 > 3.17 >3.49 >3.84>>3.04>>>2.32 -Currently producing about 1.5 L of urine over last 24 hours.   -BUN rising as well but this could also be secondary to steroids.  Potassium and bicarb are relatively stable.   -Renal ultrasound is negative for acute pathology.   -appreciate renal follow up and recommendations. -he does not look to be florridly fluid overloaded -previously on Bumex 2 mg bid when he lived in [place] -Volume status has remained stable -Continue the use of current dose of Bumex and spironolactone as recommended by nephrology..   Acute metabolic encephalopathy -Resolved.  -Mentation back to baseline. -Patient is oriented x3 and following commands appropriately.   Essential hypertension - Continue the use of Coreg,  hydralazine, clonidine, imdur and amlodipine. -nifedipine stopped due to concerns for shunt -Continue to follow vital signs and further adjust antihypertensive treatment as needed -Following nephrology service recommendation and spironolactone has been added.  Hyperlipidemia - Continue the use of Crestor        Status is: Inpatient   Remains inpatient  appropriate because:Inpatient level of care appropriate due to severity of illness   Dispo: The patient is from: Home              Anticipated d/c is to: Home              Patient currently is not medically stable to d/c.  Making progress and hopefully able to be transferred outside the unit to telemetry bed on 10-22.              Difficult to place patient No          Family Communication:   Wife updated at bedside.   Consultants:  pulm   Code Status:  FULL    DVT Prophylaxis:  [place] Heparin     Procedures: As Listed in Progress Note Above   Antibiotics: Zosyn 9/20>>9/27     Subjective: Patient reports good urine output; no chest pain, no nausea, no vomiting.  Reports breathing continues to improve.  Use BiPAP at nighttime and expressed no complications.  Objective: Vitals:   04/25/21 0745 04/25/21 0800 04/25/21 0844 04/25/21 0908  BP:  (!) 168/87    Pulse:      Resp:  (!) 27 17   Temp: 98.2 F (36.8 C)     TempSrc: Axillary     SpO2:   94% 98%  Weight:      Height:        Intake/Output Summary (Last 24 hours) at 04/25/2021 1010 Last data filed at 04/25/2021 0745 Gross per 24 hour  Intake 117 ml  Output 3350 ml  Net -3233 ml   Weight change: 0.7 kg  Exam: General exam: Alert, awake, oriented x 3, reports restful night, no chest pain, no nausea or vomiting.  Breathing continues to improve.  Good urine output. Respiratory system: Decreased breath sounds at the bases; positive rhonchi bilaterally, mild expiratory wheezing appreciated on exam. Cardiovascular system:RRR. No murmurs, rubs, gallops.  No JVD. Gastrointestinal system: Abdomen is obese, nondistended, soft and nontender. No organomegaly or masses felt. Normal bowel sounds heard. Central nervous system: Alert and oriented. No focal neurological deficits. Extremities: No cyanosis or clubbing.  Trace edema appreciated bilaterally. Skin: No petechiae. Psychiatry: Judgement and insight appear normal. Mood &  affect appropriate.   Data Reviewed: I have personally reviewed following labs and imaging studies  Basic Metabolic Panel: Recent Labs  Lab 04/21/21 0443 04/22/21 0449 04/23/21 0433 04/24/21 0453 04/25/21 0424  NA 137 137 134* 136 135  K 3.9 4.1 4.7 4.2 4.2  CL 107 105 102 104 103  CO2 20* 21* 22 20* 22  GLUCOSE 162* 222* 345* 234* 309*  BUN 104* 108* 108* 111* 104*  CREATININE 2.55* 2.57* 2.54* 2.32* 2.35*  CALCIUM 7.5* 7.5* 7.7* 8.1* 8.1*  PHOS  --  4.8* 4.7* 4.3 3.9   Liver Function Tests: Recent Labs  Lab 04/19/21 0405 04/21/21 0443 04/22/21 0449 04/23/21 0433 04/24/21 0453 04/25/21 0424  AST 11* 20  --   --   --   --   ALT 16 24  --   --   --   --   ALKPHOS 65 73  --   --   --   --   BILITOT 0.7 0.6  --   --   --   --   PROT 6.2* 6.3*  --   --   --   --   ALBUMIN 2.3* 2.4* 2.4* 2.4* 2.6* 2.5*   CBC: Recent Labs  Lab  04/19/21 0405 04/21/21 0443  WBC 9.4 10.8*  HGB 8.8* 8.7*  HCT 27.5* 27.3*  MCV 93.5 95.8  PLT 267 276    CBG: Recent Labs  Lab 04/24/21 0757 04/24/21 1133 04/24/21 1641 04/24/21 2113 04/25/21 0743  GLUCAP 248* 244* 273* 251* 258*   Urine analysis:    Component Value Date/Time   COLORURINE YELLOW 04/14/2021 1803   APPEARANCEUR CLEAR 04/14/2021 1803   LABSPEC >1.030 (H) 04/14/2021 1803   PHURINE 5.0 04/14/2021 1803   GLUCOSEU NEGATIVE 04/14/2021 1803   HGBUR NEGATIVE 04/14/2021 1803   BILIRUBINUR NEGATIVE 04/14/2021 1803   KETONESUR NEGATIVE 04/14/2021 1803   PROTEINUR TRACE (A) 04/14/2021 1803   NITRITE NEGATIVE 04/14/2021 1803   LEUKOCYTESUR NEGATIVE 04/14/2021 1803   Recent Results (from the past 240 hour(s))  Expectorated Sputum Assessment w Gram Stain, Rflx to Resp Cult     Status: None   Collection Time: 04/16/21  7:00 PM   Specimen: Sputum  Result Value Ref Range Status   Specimen Description SPUTUM  Final   Special Requests NONE  Final   Sputum evaluation   Final    THIS SPECIMEN IS ACCEPTABLE FOR SPUTUM  CULTURE PERFORMED AT [facility] Performed at [facility], [address], [place], [place] [address]    Report Status 04/16/2021 FINAL  Final  Culture, Respiratory w Gram Stain     Status: None   Collection Time: 04/16/21  7:00 PM   Specimen: SPU  Result Value Ref Range Status   Specimen Description   Final    SPUTUM Performed at [facility], [address], [place], [place] [address]    Special Requests   Final    NONE Reflexed from [id number] Performed at [facility], [address], [place], [place] [address]    Gram Stain   Final    NO WBC SEEN RARE SQUAMOUS EPITHELIAL CELLS PRESENT RARE GRAM POSITIVE COCCI RARE YEAST    Culture   Final    FEW Normal respiratory flora-no Staph aureus or Pseudomonas seen Performed at [facility], [place] [address] [place], [place] [address]    Report Status 04/19/2021 FINAL  Final     Scheduled Meds:  amLODipine  10 mg Oral Daily   aspirin EC  81 mg Oral Daily   budesonide (PULMICORT) nebulizer solution  0.5 mg Nebulization BID   bumetanide  3 mg Oral BID   buPROPion  100 mg Oral BID   carvedilol  25 mg Oral BID WC   Chlorhexidine Gluconate Cloth  6 each Topical Daily   cloNIDine  0.3 mg Oral TID   ferrous sulfate  325 mg Oral Q breakfast   heparin  5,000 Units Subcutaneous Q8H   hydrALAZINE  100 mg Oral Q8H   insulin aspart  0-5 Units Subcutaneous QHS   insulin aspart  0-9 Units Subcutaneous TID WC   insulin aspart  5 Units Subcutaneous TID WC   insulin glargine-yfgn  45 Units Subcutaneous QHS   ipratropium-albuterol  3 mL Nebulization Q6H   isosorbide mononitrate  30 mg Oral BID   mouth rinse  15 mL Mouth Rinse BID   methylPREDNISolone (SOLU-MEDROL) injection  40 mg Intravenous Q12H   pantoprazole  40 mg Oral Daily   rosuvastatin  20 mg Oral Daily   spironolactone  25 mg Oral Daily   Procedures/Studies: CT CHEST WO CONTRAST  Result Date: 04/12/2021 CLINICAL DATA:  60 year old male with history of acute hypoxic respiratory  failure requiring BiPAP. EXAM: CT CHEST  WITHOUT CONTRAST TECHNIQUE: Multidetector CT imaging of the chest was performed following the standard protocol without IV contrast. COMPARISON:  No priors. FINDINGS: Cardiovascular: Heart size is normal. There is no significant pericardial fluid, thickening or pericardial calcification. There is aortic atherosclerosis, as well as atherosclerosis of the great vessels of the mediastinum and the coronary arteries, including calcified atherosclerotic plaque in the left anterior descending and left circumflex coronary arteries. Mediastinum/Nodes: Multiple prominent borderline enlarged mediastinal and bilateral hilar lymph nodes are noted, nonspecific and likely reactive. Esophagus is unremarkable in appearance. No axillary lymphadenopathy. Lungs/Pleura: Small bilateral pleural effusions (right greater than left) lying dependently. Patchy multifocal airspace consolidation most evident in a peribronchovascular distribution with surrounding ground-glass attenuation and septal thickening. Upper Abdomen: Unremarkable. Musculoskeletal: There are no aggressive appearing lytic or blastic lesions noted in the visualized portions of the skeleton. Multiple old healed rib fractures. IMPRESSION: 1. Severe multilobar bilateral bronchopneumonia with small bilateral parapneumonic pleural effusions lying dependently, as above. 2. Aortic atherosclerosis, in addition to 2 vessel coronary artery disease. Please note that although the presence of coronary artery calcium documents the presence of coronary artery disease, the severity of this disease and any potential stenosis cannot be assessed on this non-gated CT examination. Assessment for potential risk factor modification, dietary therapy or pharmacologic therapy may be warranted, if clinically indicated. Aortic Atherosclerosis (ICD10-I70.0). Electronically Signed   By: Vinnie Langton M.D.   On: 04/12/2021 18:58   US RENAL  Result Date:  04/14/2021 CLINICAL DATA:  Acute renal injury EXAM: RENAL / URINARY TRACT ULTRASOUND COMPLETE COMPARISON:  None. FINDINGS: Right Kidney: Renal measurements: 12.3 x 4.5 x 5.7 cm. = volume: 165 mL. Echogenicity within normal limits. No mass or hydronephrosis visualized. Left Kidney: Renal measurements: 10.9 x 6.0 x 4.9 cm. = volume: 66 mL. Echogenicity within normal limits. No mass or hydronephrosis visualized. Bladder: Bladder is well distended. Mild wall thickening is noted anteriorly measuring up to 9 mm. This could be related to underlying UTI. Clinical correlation is recommended. Other: None. IMPRESSION: Normal-appearing kidneys bilaterally. Mild bladder wall thickening which may be inflammatory in nature. Clinical correlation is recommended. Electronically Signed   By: Inez Catalina M.D.   On: 04/14/2021 15:54   DG Chest Port 1 View  Result Date: 04/21/2021 CLINICAL DATA:  Shortness of breath EXAM: PORTABLE CHEST 1 VIEW COMPARISON:  04/21/2021 FINDINGS: Bilateral interstitial and patchy alveolar airspace opacities. Small left pleural effusion. No pneumothorax. Stable cardiomegaly. No acute osseous abnormality. IMPRESSION: 1. Cardiomegaly with bilateral interstitial and alveolar airspace opacities and trace left pleural effusion. Differential considerations include pulmonary edema versus multilobar pneumonia. Electronically Signed   By: Kathreen Devoid M.D.   On: 04/21/2021 19:31   DG CHEST PORT 1 VIEW  Result Date: 04/21/2021 CLINICAL DATA:  Hypoxia EXAM: PORTABLE CHEST 1 VIEW COMPARISON:  Chest x-ray dated April 20, 2021 FINDINGS: Unchanged cardiomegaly. Bilateral heterogeneous opacities with slightly more focal appearance the right mid lung, likely due to superimposed atelectasis. Small bilateral pleural effusions. No evidence of pneumothorax. IMPRESSION: Similar bilateral heterogeneous pulmonary opacities and small bilateral pleural effusions. Electronically Signed   By: Yetta Glassman M.D.   On:  04/21/2021 08:11   DG Chest Port 1 View  Result Date: 04/20/2021 CLINICAL DATA:  Acute respiratory failure EXAM: PORTABLE CHEST 1 VIEW COMPARISON:  04/14/2021 FINDINGS: Bilateral airspace disease again noted, unchanged. Heart is mildly enlarged. Possible small layering left effusion. Scattered aortic calcifications. No acute bony abnormality. IMPRESSION: Diffuse bilateral airspace disease, unchanged. Suspect layering left effusion.  Electronically Signed   By: Rolm Baptise M.D.   On: 04/20/2021 08:02   DG Chest Port 1 View  Result Date: 04/14/2021 CLINICAL DATA:  Shortness of breath, history of stroke, diabetes mellitus, hypertension, MI, cancer EXAM: PORTABLE CHEST 1 VIEW COMPARISON:  Portable exam 1340 hours compared to 04/10/2021 FINDINGS: Enlargement of cardiac silhouette with pulmonary vascular congestion. Stable mediastinal contours. BILATERAL pulmonary infiltrates question pulmonary edema, slightly greater at RIGHT base. Small BILATERAL pleural effusions. No pneumothorax or acute osseous findings. IMPRESSION: Enlargement of cardiac silhouette with pulmonary vascular congestion and mild pulmonary edema. Small BILATERAL pleural effusions. Electronically Signed   By: Lavonia Dana M.D.   On: 04/14/2021 16:01   DG CHEST PORT 1 VIEW  Result Date: 04/10/2021 CLINICAL DATA:  Weakness and fatigue. EXAM: PORTABLE CHEST 1 VIEW COMPARISON:  04/09/2021 FINDINGS: 0657 hours. The cardio pericardial silhouette is enlarged. Focal airspace disease again noted right base with some probable atelectasis or infiltrate in the retrocardiac left base. There is pulmonary vascular congestion without overt pulmonary edema. Superimposed component of interstitial pulmonary edema suspected. IMPRESSION: Asymmetric focal airspace disease right base with some probable atelectasis or infiltrate in the retrocardiac left base. Interstitial edema not excluded. Electronically Signed   By: Misty Stanley M.D.   On: 04/10/2021 07:33   DG  Chest Port 1 View  Result Date: 04/09/2021 CLINICAL DATA:  Fever and weakness EXAM: PORTABLE CHEST 1 VIEW COMPARISON:  None. FINDINGS: The heart and mediastinal contours are within normal limits. Aortic calcification. Right lower lung zone focal consolidation. Question retrocardiac opacity. No pulmonary edema. Possible trace left pleural effusion. No right pleural effusion. No pneumothorax. No acute osseous abnormality. IMPRESSION: Right lower lung zone focal consolidation. Question retrocardiac opacity with possible trace left pleural effusion. Findings consistent with infection/inflammation. Followup PA and lateral chest X-ray is recommended in 3-4 weeks following therapy to ensure resolution and exclude underlying malignancy. Electronically Signed   By: Iven Finn M.D.   On: 04/09/2021 23:59   ECHOCARDIOGRAM COMPLETE  Result Date: 04/10/2021    ECHOCARDIOGRAM REPORT   Patient Name:   LEELAND Berrian Date of Exam: 04/10/2021 Medical Rec #:  [id number]    Height:       69.0 in Accession #:    [id number]   Weight:       217.0 lb Date of Birth:  04-24-1961    BSA:          2.139 m Patient Age:    56 years     BP:           142/83 mmHg Patient Gender: M            HR:           85 bpm. Exam Location:  [facility] Procedure: 2D Echo, Cardiac Doppler and Color Doppler Indications:    SBE I33.9  History:        Patient has no prior history of Echocardiogram examinations.                 Previous Myocardial Infarction, Stroke; Risk                 Factors:Hypertension and Diabetes. Hx of cancer. 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  Sonographer:    Alvino Chapel RCS Referring Phys: [place]  1. Left ventricular ejection fraction, by estimation, is 55 to 60%. The  left ventricle has normal function. The left ventricle has no regional wall motion abnormalities. There is moderate concentric left ventricular hypertrophy. Left ventricular diastolic parameters are  consistent with Grade II diastolic dysfunction (pseudonormalization).  2. Right ventricular systolic function is normal. The right ventricular size is normal. There is normal pulmonary artery systolic pressure. The estimated right ventricular systolic pressure is 40.9 mmHg.  3. Left atrial size was moderately dilated.  4. Right atrial size was mildly dilated.  5. The mitral valve is grossly normal. No evidence of mitral valve regurgitation. No evidence of mitral stenosis.  6. The aortic valve is tricuspid. Aortic valve regurgitation is not visualized. No aortic stenosis is present.  7. The inferior vena cava is normal in size with greater than 50% respiratory variability, suggesting right atrial pressure of 3 mmHg. FINDINGS  Left Ventricle: Left ventricular ejection fraction, by estimation, is 55 to 60%. The left ventricle has normal function. The left ventricle has no regional wall motion abnormalities. The left ventricular internal cavity size was normal in size. There is  moderate concentric left ventricular hypertrophy. Left ventricular diastolic parameters are consistent with Grade II diastolic dysfunction (pseudonormalization). Right Ventricle: The right ventricular size is normal. No increase in right ventricular wall thickness. Right ventricular systolic function is normal. There is normal pulmonary artery systolic pressure. The tricuspid regurgitant velocity is 2.10 m/s, and  with an assumed right atrial pressure of 3 mmHg, the estimated right ventricular systolic pressure is 81.1 mmHg. Left Atrium: Left atrial size was moderately dilated. Right Atrium: Right atrial size was mildly dilated. Pericardium: There is no evidence of pericardial effusion. Mitral Valve: The mitral valve is grossly normal. No evidence of mitral valve regurgitation. No evidence of mitral valve stenosis. Tricuspid Valve: The tricuspid valve is grossly normal. Tricuspid valve regurgitation is trivial. No evidence of tricuspid stenosis.  Aortic Valve: The aortic valve is tricuspid. Aortic valve regurgitation is not visualized. No aortic stenosis is present. Pulmonic Valve: The pulmonic valve was grossly normal. Pulmonic valve regurgitation is not visualized. No evidence of pulmonic stenosis. Aorta: The aortic root is normal in size and structure. Venous: The inferior vena cava is normal in size with greater than 50% respiratory variability, suggesting right atrial pressure of 3 mmHg. IAS/Shunts: The atrial septum is grossly normal.  LEFT VENTRICLE PLAX 2D LVIDd:         4.70 cm  Diastology LVIDs:         3.10 cm  LV e' medial:    6.74 cm/s LV PW:         1.50 cm  LV E/e' medial:  16.5 LV IVS:        1.50 cm  LV e' lateral:   9.14 cm/s LVOT diam:     2.00 cm  LV E/e' lateral: 12.1 LV SV:         65 LV SV Index:   31 LVOT Area:     3.14 cm  RIGHT VENTRICLE RV S prime:     13.80 cm/s TAPSE (M-mode): 2.6 cm LEFT ATRIUM             Index       RIGHT ATRIUM           Index LA diam:        4.20 cm 1.96 cm/m  RA Area:     24.30 cm LA Vol (A2C):   79.4 ml 37.12 ml/m RA Volume:   81.40 ml  38.06 ml/m LA Vol (A4C):  93.6 ml 43.76 ml/m LA Biplane Vol: 86.2 ml 40.30 ml/m  AORTIC VALVE LVOT Vmax:   102.00 cm/s LVOT Vmean:  62.500 cm/s LVOT VTI:    0.208 m  AORTA Ao Root diam: 3.60 cm MITRAL VALVE                TRICUSPID VALVE MV Area (PHT): 4.15 cm     TR Peak grad:   17.6 mmHg MV Decel Time: 183 msec     TR Vmax:        210.00 cm/s MV E velocity: 111.00 cm/s MV A velocity: 79.70 cm/s   SHUNTS MV E/A ratio:  1.39         Systemic VTI:  0.21 m                             Systemic Diam: 2.00 cm Eleonore Chiquito MD Electronically signed by Eleonore Chiquito MD Signature Date/Time: 04/10/2021/1:25:55 PM    Final    [place] EKG SITE RITE  Result Date: 04/17/2021 If Site Rite image not attached, placement could not be confirmed due to current cardiac rhythm.   Barton Dubois, MD  [facility]  If 7PM-7AM, please contact  night-coverage [email or web address]  Password [email or web address] 04/25/2021, 10:10 AM   LOS: 15 days

## 2021-04-25 NOTE — Progress Notes (Signed)
Pt ambulated approx. 20 feet in room with assistance of nursing staff. Pt was on 4L HFNC when ambulating and the lowest the O2 sat dropped to was 78%. Pt made it from the recliner to the doorway, and then the patient had to stop back at the bed and rest. Pt stated he may try ambulating again later to the recliner. PT consult placed, MD made aware. Will continue to monitor.

## 2021-04-26 DIAGNOSIS — J9621 Acute and chronic respiratory failure with hypoxia: Secondary | ICD-10-CM | POA: Diagnosis not present

## 2021-04-26 DIAGNOSIS — R918 Other nonspecific abnormal finding of lung field: Secondary | ICD-10-CM | POA: Diagnosis not present

## 2021-04-26 DIAGNOSIS — I5033 Acute on chronic diastolic (congestive) heart failure: Secondary | ICD-10-CM | POA: Diagnosis not present

## 2021-04-26 DIAGNOSIS — J9622 Acute and chronic respiratory failure with hypercapnia: Secondary | ICD-10-CM | POA: Diagnosis not present

## 2021-04-26 DIAGNOSIS — J9601 Acute respiratory failure with hypoxia: Secondary | ICD-10-CM | POA: Diagnosis not present

## 2021-04-26 DIAGNOSIS — N179 Acute kidney failure, unspecified: Secondary | ICD-10-CM | POA: Diagnosis not present

## 2021-04-26 DIAGNOSIS — N1832 Chronic kidney disease, stage 3b: Secondary | ICD-10-CM | POA: Diagnosis not present

## 2021-04-26 LAB — RENAL FUNCTION PANEL
Albumin: 2.4 g/dL — ABNORMAL LOW (ref 3.5–5.0)
Anion gap: 9 (ref 5–15)
BUN: 105 mg/dL — ABNORMAL HIGH (ref 6–20)
CO2: 23 mmol/L (ref 22–32)
Calcium: 8.2 mg/dL — ABNORMAL LOW (ref 8.9–10.3)
Chloride: 103 mmol/L (ref 98–111)
Creatinine, Ser: 2.43 mg/dL — ABNORMAL HIGH (ref 0.61–1.24)
GFR, Estimated: 30 mL/min — ABNORMAL LOW (ref >60)
Glucose, Bld: 272 mg/dL — ABNORMAL HIGH (ref 70–99)
Phosphorus: 3.4 mg/dL (ref 2.5–4.6)
Potassium: 4.3 mmol/L (ref 3.5–5.1)
Sodium: 135 mmol/L (ref 135–145)

## 2021-04-26 LAB — GLUCOSE, CAPILLARY
Glucose-Capillary: 232 mg/dL — ABNORMAL HIGH (ref 70–99)
Glucose-Capillary: 282 mg/dL — ABNORMAL HIGH (ref 70–99)
Glucose-Capillary: 349 mg/dL — ABNORMAL HIGH (ref 70–99)
Glucose-Capillary: 343 mg/dL — ABNORMAL HIGH (ref 70–99)

## 2021-04-26 MED ORDER — SPIRONOLACTONE 25 MG PO TABS
25.0000 mg | ORAL_TABLET | Freq: Two times a day (BID) | ORAL | Status: DC
  Administered 2021-04-26 – 2021-05-01 (×10): 25 mg via ORAL
  Filled 2021-04-26 (×11): qty 1

## 2021-04-26 NOTE — Progress Notes (Signed)
PROGRESS NOTE  Keith Snow DXI:[id number] DOB: Nov 03, 1960 DOA: 04/09/2021 PCP: System, Provider Not In   Brief History:  60 year old with history of DM2, HTN, MI, CHF comes to the ED with change in mental status.  Upon admission he was noted to have focal consolidation in the right lower lung, elevated BNP.  He was also hyperglycemic.  CT of the chest showed combination of severe bilateral bronchopneumonia and possible effusion.  It has been difficult to diurese him due to worsening renal function.  Nephrology and Pulm consulted for their input. Started on Broad Spectrum Abx and Steroids. Cr continues to increase slowly.    Assessment/Plan: Acute hypoxic respiratory failure requiring BiPAP and HiFlow - Multifactorial.  Combination of PNA, COPD and CHF exacerbation. -9/19 CT chest Severe b/l BronchoPNA with small b/l parapneumonic effusions.  -9/27 CXR personally reviewed-unchanged diffuse bilateral pulm infiltrates -Still requiring high flow nasal cannula supplementation with intermittent use of BiPAP.  Continue medications and treatment as specified below -Appreciate assistance by pulmonology service, nephrology and cardiology service. -Patient continue to improve slowly. -Physical therapy recommending home health services. -Continue current treatment. -Continue weaning off oxygen supplementation as tolerated.   Community-acquired pneumonia. Aspiration PNA?  Concerns for BOOP - Pro-Cal 0.54>>0.34  -Cleared by speech and swallow for regular diet. - Currently on IV Zosyn 7/20>> - Bronchodilators scheduled and as needed.  I-S/flutter -Speech and swallow eval-regular diet -MRSA neg -9/26 discussed with Pulm, Dr. Alfonzo Beers to po steroids -patient has finished 7 days zosyn; remained afebrile. -Since 04/20/2021 following pulmonology service patient is steroids has been restarted through his pains at a higher dose. Planning for start slow tapering once hypoxia further improved  (O2 supplementation weaned down to 5L) -Continue to follow response and further recommendations by pulmonology service. -Patient has continue to demonstrate improvement slowly.     Diabetes mellitus type 2, uncontrolled with hyperglycemia - 9/17 A1c-10.3 - Continue current dose of long-acting insulin and continue sliding scale -Continue to follow CBGs -Modify carbohydrate diet has been recommended.   Acute on chronic congestive heart failure with preserved EF, grade 2 DD, class III -04/10/21 Echo 55-60%, grade 2 DD, moderate LVH - Lasix IV by nephrology>>transition to 1/2 home dose bumex -9/26 discussed with renal--Dr. Joelyn Oms -previously on Entresto, spironolactone which were held due to AKI. Per nephrology will resume spironolactone. -Continue to follow I's and O's -Continue low-sodium diet. -Patient expressed good urine output.   Acute kidney injury on CKD stage IIIa - Admission creatinine 1.8, 2.63 > 2.71 > 3.17 >3.49 >3.84>>3.04>>>2.32 -Currently producing about 1.5 L of urine over last 24 hours.   -BUN rising as well but this could also be secondary to steroids.  Potassium and bicarb are relatively stable.   -Renal ultrasound is negative for acute pathology.   -appreciate renal follow up and recommendations. -he does not look to be florridly fluid overloaded -previously on Bumex 2 mg bid when he lived in [place] -Volume status has remained stable -Continue the use of current dose of Bumex and spironolactone as recommended by nephrology..   Acute metabolic encephalopathy -Resolved.  -Mentation back to baseline. -Patient is oriented x3 and following commands appropriately.   Essential hypertension - Continue the use of Coreg,  hydralazine, clonidine, imdur and amlodipine. -nifedipine stopped due to concerns for shunt -Continue to follow vital signs and further adjust antihypertensive treatment as needed -Following nephrology service recommendation and spironolactone has been  added.  Hyperlipidemia - Continue the use of  Crestor        Status is: Inpatient   Remains inpatient appropriate because:Inpatient level of care appropriate due to severity of illness   Dispo: The patient is from: Home              Anticipated d/c is to: Home              Patient currently is not medically stable to d/c.  Making progress and hopefully able to be transferred outside the unit to telemetry bed on 10-22.              Difficult to place patient No          Family Communication:   Wife updated at bedside.   Consultants:  pulm   Code Status:  FULL    DVT Prophylaxis:  [place] Heparin     Procedures: As Listed in Progress Note Above   Antibiotics: Zosyn 9/20>>9/27     Subjective: Good urine output reported; no chest pain, no nausea, no vomiting, continues slowly demonstrating improvement in his breathing.  Patient continues to use BiPAP at bedtime.  Objective: Vitals:   04/26/21 1004 04/26/21 1100 04/26/21 1146 04/26/21 1309  BP: (!) 177/88   (!) 141/60  Pulse:   86   Resp:   14 19  Temp:   (!) 97.3 F (36.3 C)   TempSrc:   Oral   SpO2:  100% (!) 81%   Weight:      Height:        Intake/Output Summary (Last 24 hours) at 04/26/2021 1656 Last data filed at 04/26/2021 1412 Gross per 24 hour  Intake 240 ml  Output 2050 ml  Net -1810 ml   Weight change:   Exam: General exam: Alert, awake, oriented x 3; no chest pain, no nausea, no vomiting.  Patient reports still short winded with activity and demonstrating desaturation with exertion.  5 L nasal cannula supplementation in place. Respiratory system: Positive rhonchi bilaterally; mild expiratory wheezing, no using accessory muscle.  Decreased breath sounds at the bases. Cardiovascular system:RRR. No murmurs, rubs, gallops.  No JVD on exam. Gastrointestinal system: Abdomen is obese, nondistended, soft and nontender. No organomegaly or masses felt. Normal bowel sounds heard. Central nervous system: Alert  and oriented. No focal neurological deficits. Extremities: No cyanosis or clubbing; trace edema bilaterally. Skin: No rashes, no petechiae. Psychiatry: Judgement and insight appear normal. Mood & affect appropriate.   Data Reviewed: I have personally reviewed following labs and imaging studies  Basic Metabolic Panel: Recent Labs  Lab 04/22/21 0449 04/23/21 0433 04/24/21 0453 04/25/21 0424 04/26/21 0420  NA 137 134* 136 135 135  K 4.1 4.7 4.2 4.2 4.3  CL 105 102 104 103 103  CO2 21* 22 20* 22 23  GLUCOSE 222* 345* 234* 309* 272*  BUN 108* 108* 111* 104* 105*  CREATININE 2.57* 2.54* 2.32* 2.35* 2.43*  CALCIUM 7.5* 7.7* 8.1* 8.1* 8.2*  PHOS 4.8* 4.7* 4.3 3.9 3.4   Liver Function Tests: Recent Labs  Lab 04/21/21 0443 04/22/21 0449 04/23/21 0433 04/24/21 0453 04/25/21 0424 04/26/21 0420  AST 20  --   --   --   --   --   ALT 24  --   --   --   --   --   ALKPHOS 73  --   --   --   --   --   BILITOT 0.6  --   --   --   --   --  PROT 6.3*  --   --   --   --   --   ALBUMIN 2.4* 2.4* 2.4* 2.6* 2.5* 2.4*   CBC: Recent Labs  Lab 04/21/21 0443  WBC 10.8*  HGB 8.7*  HCT 27.3*  MCV 95.8  PLT 276    CBG: Recent Labs  Lab 04/25/21 1107 04/25/21 1600 04/25/21 2024 04/26/21 0737 04/26/21 1144  GLUCAP 302* 263* 236* 232* 282*   Urine analysis:    Component Value Date/Time   COLORURINE YELLOW 04/14/2021 1803   APPEARANCEUR CLEAR 04/14/2021 1803   LABSPEC >1.030 (H) 04/14/2021 1803   PHURINE 5.0 04/14/2021 1803   GLUCOSEU NEGATIVE 04/14/2021 1803   HGBUR NEGATIVE 04/14/2021 1803   BILIRUBINUR NEGATIVE 04/14/2021 1803   KETONESUR NEGATIVE 04/14/2021 1803   PROTEINUR TRACE (A) 04/14/2021 1803   NITRITE NEGATIVE 04/14/2021 1803   LEUKOCYTESUR NEGATIVE 04/14/2021 1803   Recent Results (from the past 240 hour(s))  Expectorated Sputum Assessment w Gram Stain, Rflx to Resp Cult     Status: None   Collection Time: 04/16/21  7:00 PM   Specimen: Sputum  Result Value  Ref Range Status   Specimen Description SPUTUM  Final   Special Requests NONE  Final   Sputum evaluation   Final    THIS SPECIMEN IS ACCEPTABLE FOR SPUTUM CULTURE PERFORMED AT [facility] Performed at [facility], [address], [place], [place] [address]    Report Status 04/16/2021 FINAL  Final  Culture, Respiratory w Gram Stain     Status: None   Collection Time: 04/16/21  7:00 PM   Specimen: SPU  Result Value Ref Range Status   Specimen Description   Final    SPUTUM Performed at [facility], [address] [place], [place] [address]    Special Requests   Final    NONE Reflexed from [id number] Performed at [facility], [address], [place], [place] [address]    Gram Stain   Final    NO WBC SEEN RARE SQUAMOUS EPITHELIAL CELLS PRESENT RARE GRAM POSITIVE COCCI RARE YEAST    Culture   Final    FEW Normal respiratory flora-no Staph aureus or Pseudomonas seen Performed at [facility], [place] [address], [place], [place] [address]    Report Status 04/19/2021 FINAL  Final     Scheduled Meds:  amLODipine  10 mg Oral Daily   aspirin EC  81 mg Oral Daily   budesonide (PULMICORT) nebulizer solution  0.5 mg Nebulization BID   bumetanide  3 mg Oral BID   buPROPion  100 mg Oral BID   carvedilol  25 mg Oral BID WC   Chlorhexidine Gluconate Cloth  6 each Topical Daily   cloNIDine  0.3 mg Oral TID   ferrous sulfate  325 mg Oral Q breakfast   heparin  5,000 Units Subcutaneous Q8H   hydrALAZINE  100 mg Oral Q8H   insulin aspart  0-5 Units Subcutaneous QHS   insulin aspart  0-9 Units Subcutaneous TID WC   insulin aspart  5 Units Subcutaneous TID WC   insulin glargine-yfgn  45 Units Subcutaneous QHS   ipratropium-albuterol  3 mL Nebulization Q6H   isosorbide mononitrate  30 mg Oral BID   mouth rinse  15 mL Mouth Rinse BID   methylPREDNISolone (SOLU-MEDROL) injection  40 mg Intravenous Q12H   pantoprazole  40 mg Oral Daily   rosuvastatin  20 mg Oral Daily   spironolactone  25 mg Oral  BID   Procedures/Studies:  CT CHEST WO CONTRAST  Result Date: 04/12/2021 CLINICAL DATA:  60 year old male with history of acute hypoxic respiratory failure requiring BiPAP. EXAM: CT CHEST WITHOUT CONTRAST TECHNIQUE: Multidetector CT imaging of the chest was performed following the standard protocol without IV contrast. COMPARISON:  No priors. FINDINGS: Cardiovascular: Heart size is normal. There is no significant pericardial fluid, thickening or pericardial calcification. There is aortic atherosclerosis, as well as atherosclerosis of the great vessels of the mediastinum and the coronary arteries, including calcified atherosclerotic plaque in the left anterior descending and left circumflex coronary arteries. Mediastinum/Nodes: Multiple prominent borderline enlarged mediastinal and bilateral hilar lymph nodes are noted, nonspecific and likely reactive. Esophagus is unremarkable in appearance. No axillary lymphadenopathy. Lungs/Pleura: Small bilateral pleural effusions (right greater than left) lying dependently. Patchy multifocal airspace consolidation most evident in a peribronchovascular distribution with surrounding ground-glass attenuation and septal thickening. Upper Abdomen: Unremarkable. Musculoskeletal: There are no aggressive appearing lytic or blastic lesions noted in the visualized portions of the skeleton. Multiple old healed rib fractures. IMPRESSION: 1. Severe multilobar bilateral bronchopneumonia with small bilateral parapneumonic pleural effusions lying dependently, as above. 2. Aortic atherosclerosis, in addition to 2 vessel coronary artery disease. Please note that although the presence of coronary artery calcium documents the presence of coronary artery disease, the severity of this disease and any potential stenosis cannot be assessed on this non-gated CT examination. Assessment for potential risk factor modification, dietary therapy or pharmacologic therapy may be warranted, if clinically  indicated. Aortic Atherosclerosis (ICD10-I70.0). Electronically Signed   By: Vinnie Langton M.D.   On: 04/12/2021 18:58   US RENAL  Result Date: 04/14/2021 CLINICAL DATA:  Acute renal injury EXAM: RENAL / URINARY TRACT ULTRASOUND COMPLETE COMPARISON:  None. FINDINGS: Right Kidney: Renal measurements: 12.3 x 4.5 x 5.7 cm. = volume: 165 mL. Echogenicity within normal limits. No mass or hydronephrosis visualized. Left Kidney: Renal measurements: 10.9 x 6.0 x 4.9 cm. = volume: 66 mL. Echogenicity within normal limits. No mass or hydronephrosis visualized. Bladder: Bladder is well distended. Mild wall thickening is noted anteriorly measuring up to 9 mm. This could be related to underlying UTI. Clinical correlation is recommended. Other: None. IMPRESSION: Normal-appearing kidneys bilaterally. Mild bladder wall thickening which may be inflammatory in nature. Clinical correlation is recommended. Electronically Signed   By: Inez Catalina M.D.   On: 04/14/2021 15:54   DG Chest Port 1 View  Result Date: 04/21/2021 CLINICAL DATA:  Shortness of breath EXAM: PORTABLE CHEST 1 VIEW COMPARISON:  04/21/2021 FINDINGS: Bilateral interstitial and patchy alveolar airspace opacities. Small left pleural effusion. No pneumothorax. Stable cardiomegaly. No acute osseous abnormality. IMPRESSION: 1. Cardiomegaly with bilateral interstitial and alveolar airspace opacities and trace left pleural effusion. Differential considerations include pulmonary edema versus multilobar pneumonia. Electronically Signed   By: Kathreen Devoid M.D.   On: 04/21/2021 19:31   DG CHEST PORT 1 VIEW  Result Date: 04/21/2021 CLINICAL DATA:  Hypoxia EXAM: PORTABLE CHEST 1 VIEW COMPARISON:  Chest x-ray dated April 20, 2021 FINDINGS: Unchanged cardiomegaly. Bilateral heterogeneous opacities with slightly more focal appearance the right mid lung, likely due to superimposed atelectasis. Small bilateral pleural effusions. No evidence of pneumothorax.  IMPRESSION: Similar bilateral heterogeneous pulmonary opacities and small bilateral pleural effusions. Electronically Signed   By: Yetta Glassman M.D.   On: 04/21/2021 08:11   DG Chest Port 1 View  Result Date: 04/20/2021 CLINICAL DATA:  Acute respiratory failure EXAM: PORTABLE CHEST 1 VIEW COMPARISON:  04/14/2021 FINDINGS: Bilateral airspace disease again noted, unchanged. Heart  is mildly enlarged. Possible small layering left effusion. Scattered aortic calcifications. No acute bony abnormality. IMPRESSION: Diffuse bilateral airspace disease, unchanged. Suspect layering left effusion. Electronically Signed   By: Rolm Baptise M.D.   On: 04/20/2021 08:02   DG Chest Port 1 View  Result Date: 04/14/2021 CLINICAL DATA:  Shortness of breath, history of stroke, diabetes mellitus, hypertension, MI, cancer EXAM: PORTABLE CHEST 1 VIEW COMPARISON:  Portable exam 1340 hours compared to 04/10/2021 FINDINGS: Enlargement of cardiac silhouette with pulmonary vascular congestion. Stable mediastinal contours. BILATERAL pulmonary infiltrates question pulmonary edema, slightly greater at RIGHT base. Small BILATERAL pleural effusions. No pneumothorax or acute osseous findings. IMPRESSION: Enlargement of cardiac silhouette with pulmonary vascular congestion and mild pulmonary edema. Small BILATERAL pleural effusions. Electronically Signed   By: Lavonia Dana M.D.   On: 04/14/2021 16:01   DG CHEST PORT 1 VIEW  Result Date: 04/10/2021 CLINICAL DATA:  Weakness and fatigue. EXAM: PORTABLE CHEST 1 VIEW COMPARISON:  04/09/2021 FINDINGS: 0657 hours. The cardio pericardial silhouette is enlarged. Focal airspace disease again noted right base with some probable atelectasis or infiltrate in the retrocardiac left base. There is pulmonary vascular congestion without overt pulmonary edema. Superimposed component of interstitial pulmonary edema suspected. IMPRESSION: Asymmetric focal airspace disease right base with some probable  atelectasis or infiltrate in the retrocardiac left base. Interstitial edema not excluded. Electronically Signed   By: Misty Stanley M.D.   On: 04/10/2021 07:33   DG Chest Port 1 View  Result Date: 04/09/2021 CLINICAL DATA:  Fever and weakness EXAM: PORTABLE CHEST 1 VIEW COMPARISON:  None. FINDINGS: The heart and mediastinal contours are within normal limits. Aortic calcification. Right lower lung zone focal consolidation. Question retrocardiac opacity. No pulmonary edema. Possible trace left pleural effusion. No right pleural effusion. No pneumothorax. No acute osseous abnormality. IMPRESSION: Right lower lung zone focal consolidation. Question retrocardiac opacity with possible trace left pleural effusion. Findings consistent with infection/inflammation. Followup PA and lateral chest X-ray is recommended in 3-4 weeks following therapy to ensure resolution and exclude underlying malignancy. Electronically Signed   By: Iven Finn M.D.   On: 04/09/2021 23:59   ECHOCARDIOGRAM COMPLETE  Result Date: 04/10/2021    ECHOCARDIOGRAM REPORT   Patient Name:   NITIN Paige Date of Exam: 04/10/2021 Medical Rec #:  [id number]    Height:       69.0 in Accession #:    [id number]   Weight:       217.0 lb Date of Birth:  [birth year]    BSA:          2.139 m Patient Age:    71 years     BP:           142/83 mmHg Patient Gender: M            HR:           85 bpm. Exam Location:  [facility] Procedure: 2D Echo, Cardiac Doppler and Color Doppler Indications:    SBE I33.9  History:        Patient has no prior history of Echocardiogram examinations.                 Previous Myocardial Infarction, Stroke; Risk                 Factors:Hypertension and Diabetes. Hx of cancer. 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  Sonographer:  Alvino Chapel RCS Referring Phys: [id number] SEYED A SHAHMEHDI IMPRESSIONS  1. Left ventricular ejection fraction, by estimation, is 55 to 60%. The left ventricle has normal function. The  left ventricle has no regional wall motion abnormalities. There is moderate concentric left ventricular hypertrophy. Left ventricular diastolic parameters are consistent with Grade II diastolic dysfunction (pseudonormalization).  2. Right ventricular systolic function is normal. The right ventricular size is normal. There is normal pulmonary artery systolic pressure. The estimated right ventricular systolic pressure is 50.5 mmHg.  3. Left atrial size was moderately dilated.  4. Right atrial size was mildly dilated.  5. The mitral valve is grossly normal. No evidence of mitral valve regurgitation. No evidence of mitral stenosis.  6. The aortic valve is tricuspid. Aortic valve regurgitation is not visualized. No aortic stenosis is present.  7. The inferior vena cava is normal in size with greater than 50% respiratory variability, suggesting right atrial pressure of 3 mmHg. FINDINGS  Left Ventricle: Left ventricular ejection fraction, by estimation, is 55 to 60%. The left ventricle has normal function. The left ventricle has no regional wall motion abnormalities. The left ventricular internal cavity size was normal in size. There is  moderate concentric left ventricular hypertrophy. Left ventricular diastolic parameters are consistent with Grade II diastolic dysfunction (pseudonormalization). Right Ventricle: The right ventricular size is normal. No increase in right ventricular wall thickness. Right ventricular systolic function is normal. There is normal pulmonary artery systolic pressure. The tricuspid regurgitant velocity is 2.10 m/s, and  with an assumed right atrial pressure of 3 mmHg, the estimated right ventricular systolic pressure is 39.7 mmHg. Left Atrium: Left atrial size was moderately dilated. Right Atrium: Right atrial size was mildly dilated. Pericardium: There is no evidence of pericardial effusion. Mitral Valve: The mitral valve is grossly normal. No evidence of mitral valve regurgitation. No evidence  of mitral valve stenosis. Tricuspid Valve: The tricuspid valve is grossly normal. Tricuspid valve regurgitation is trivial. No evidence of tricuspid stenosis. Aortic Valve: The aortic valve is tricuspid. Aortic valve regurgitation is not visualized. No aortic stenosis is present. Pulmonic Valve: The pulmonic valve was grossly normal. Pulmonic valve regurgitation is not visualized. No evidence of pulmonic stenosis. Aorta: The aortic root is normal in size and structure. Venous: The inferior vena cava is normal in size with greater than 50% respiratory variability, suggesting right atrial pressure of 3 mmHg. IAS/Shunts: The atrial septum is grossly normal.  LEFT VENTRICLE PLAX 2D LVIDd:         4.70 cm  Diastology LVIDs:         3.10 cm  LV e' medial:    6.74 cm/s LV PW:         1.50 cm  LV E/e' medial:  16.5 LV IVS:        1.50 cm  LV e' lateral:   9.14 cm/s LVOT diam:     2.00 cm  LV E/e' lateral: 12.1 LV SV:         65 LV SV Index:   31 LVOT Area:     3.14 cm  RIGHT VENTRICLE RV S prime:     13.80 cm/s TAPSE (M-mode): 2.6 cm LEFT ATRIUM             Index       RIGHT ATRIUM           Index LA diam:        4.20 cm 1.96 cm/m  RA Area:     24.30  cm LA Vol (A2C):   79.4 ml 37.12 ml/m RA Volume:   81.40 ml  38.06 ml/m LA Vol (A4C):   93.6 ml 43.76 ml/m LA Biplane Vol: 86.2 ml 40.30 ml/m  AORTIC VALVE LVOT Vmax:   102.00 cm/s LVOT Vmean:  62.500 cm/s LVOT VTI:    0.208 m  AORTA Ao Root diam: 3.60 cm MITRAL VALVE                TRICUSPID VALVE MV Area (PHT): 4.15 cm     TR Peak grad:   17.6 mmHg MV Decel Time: 183 msec     TR Vmax:        210.00 cm/s MV E velocity: 111.00 cm/s MV A velocity: 79.70 cm/s   SHUNTS MV E/A ratio:  1.39         Systemic VTI:  0.21 m                             Systemic Diam: 2.00 cm Eleonore Chiquito MD Electronically signed by Eleonore Chiquito MD Signature Date/Time: 04/10/2021/1:25:55 PM    Final    [place] EKG SITE RITE  Result Date: 04/17/2021 If Site Rite image not attached, placement could  not be confirmed due to current cardiac rhythm.   Barton Dubois, MD  [facility]  If 7PM-7AM, please contact night-coverage [email or web address]  Password [email or web address] 04/26/2021, 4:56 PM   LOS: 16 days
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
## 2021-04-26 NOTE — Progress Notes (Signed)
Patient ID: Keith Snow, male   DOB: 1961-01-28, 60 y.o.   MRN: [id number] [facility] Progress Note   Assessment/ Plan:   1. Acute kidney Injury on chronic kidney disease stage III: Baseline creatinine appears to range 1.7-2.3 and acute kidney injury likely associated with exacerbation of congestive heart failure.  Renal function appears to be gradually improving with ongoing diuresis and off from SGLT2 inhibitor/RAAS blockade.  He is on bumetanide and his spironolactone was restarted.  If renal function continues to show improvement within the next 24 hours, would be stable from a renal standpoint to discharge him home for ongoing outpatient follow-up with his nephrologist in [place].  Although he has azotemia, he does not have any signs or symptoms of uremia. 2.  Chronic hypoxic respiratory failure/COPD: He has currently been weaned back down to oxygen via nasal cannula at 4 L a minute which he apparently has been on at home as well of the past 2 years. 3.  Exacerbation of congestive heart failure: Ongoing diuresis with bumetanide and spironolactone with gradual improvement of volume status and slow renal recovery.  Continue to hold RAS blocker/SGLT2 inhibitor until seen by his PCP/nephrologist as an outpatient.  I will , however, increase his aldactone to 25 BID -  overall 16 liters negative but still some to go 4.  Anemia of chronic disease: Status post intravenous iron and ESA. 5.  Type 2 diabetes mellitus: With poorly controlled diabetes as an outpatient and ongoing management of hyperglycemia here.  Due to stability of renal function I will sign off-  once cleared from other services I would think can follow up with his local MD's  Subjective:   Reports to be feeling better with decreased leg swelling and inquires about going home.   Objective:   BP (!) 150/63   Pulse 66   Temp (!) 97.1 F (36.2 C) (Oral)   Resp 20   Ht 5\' 9"  (1.753 m)   Wt 109.9 kg   SpO2 100%   BMI  35.78 kg/m   Intake/Output Summary (Last 24 hours) at 04/26/2021 0949 Last data filed at 04/26/2021 0100 Gross per 24 hour  Intake 480 ml  Output 2150 ml  Net -1670 ml   Weight change:   Physical Exam: Gen: Comfortably sitting up in bed, sleeping and awakens easily to conversation. CVS: Pulse regular rhythm, normal rate, S1 and S2 normal Resp: Fine rales bilaterally over both posterior lung fields, no wheeze/rhonchi Abd: Soft, obese, nontender Ext: 2+ bilateral pitting lower extremity edema  Imaging: No results found.  Labs: BMET Recent Labs  Lab 04/20/21 0500 04/21/21 0443 04/22/21 0449 04/23/21 0433 04/24/21 0453 04/25/21 0424 04/26/21 0420  NA 137 137 137 134* 136 135 135  K 3.3* 3.9 4.1 4.7 4.2 4.2 4.3  CL 106 107 105 102 104 103 103  CO2 21* 20* 21* 22 20* 22 23  GLUCOSE 64* 162* 222* 345* 234* 309* 272*  BUN 104* 104* 108* 108* 111* 104* 105*  CREATININE 2.84* 2.55* 2.57* 2.54* 2.32* 2.35* 2.43*  CALCIUM 7.4* 7.5* 7.5* 7.7* 8.1* 8.1* 8.2*  PHOS  --   --  4.8* 4.7* 4.3 3.9 3.4   CBC Recent Labs  Lab 04/21/21 0443  WBC 10.8*  HGB 8.7*  HCT 27.3*  MCV 95.8  PLT 276    Medications:     amLODipine  10 mg Oral Daily   aspirin EC  81 mg Oral Daily   budesonide (PULMICORT) nebulizer solution  0.5  mg Nebulization BID   bumetanide  3 mg Oral BID   buPROPion  100 mg Oral BID   carvedilol  25 mg Oral BID WC   Chlorhexidine Gluconate Cloth  6 each Topical Daily   cloNIDine  0.3 mg Oral TID   ferrous sulfate  325 mg Oral Q breakfast   heparin  5,000 Units Subcutaneous Q8H   hydrALAZINE  100 mg Oral Q8H   insulin aspart  0-5 Units Subcutaneous QHS   insulin aspart  0-9 Units Subcutaneous TID WC   insulin aspart  5 Units Subcutaneous TID WC   insulin glargine-yfgn  45 Units Subcutaneous QHS   ipratropium-albuterol  3 mL Nebulization Q6H   isosorbide mononitrate  30 mg Oral BID   mouth rinse  15 mL Mouth Rinse BID   methylPREDNISolone (SOLU-MEDROL)  injection  40 mg Intravenous Q12H   pantoprazole  40 mg Oral Daily   rosuvastatin  20 mg Oral Daily   spironolactone  25 mg Oral Daily    Michol Emory A Coden Franchi  04/26/2021, 9:49 AM

## 2021-04-26 NOTE — Progress Notes (Signed)
Progress Note  Patient Name: Keith Snow Date of Encounter: 04/26/2021  Primary Cardiologist: Gwen Pounds, MD  Subjective   Sitting in chair, still on supplemental oxygen via nasal cannula.  Has ambulated with oxygen desaturation however.  I met his wife today who is here from [place].  Awaits transfer to telemetry.  Inpatient Medications    Scheduled Meds:  amLODipine  10 mg Oral Daily   aspirin EC  81 mg Oral Daily   budesonide (PULMICORT) nebulizer solution  0.5 mg Nebulization BID   bumetanide  3 mg Oral BID   buPROPion  100 mg Oral BID   carvedilol  25 mg Oral BID WC   Chlorhexidine Gluconate Cloth  6 each Topical Daily   cloNIDine  0.3 mg Oral TID   ferrous sulfate  325 mg Oral Q breakfast   heparin  5,000 Units Subcutaneous Q8H   hydrALAZINE  100 mg Oral Q8H   insulin aspart  0-5 Units Subcutaneous QHS   insulin aspart  0-9 Units Subcutaneous TID WC   insulin aspart  5 Units Subcutaneous TID WC   insulin glargine-yfgn  45 Units Subcutaneous QHS   ipratropium-albuterol  3 mL Nebulization Q6H   isosorbide mononitrate  30 mg Oral BID   mouth rinse  15 mL Mouth Rinse BID   methylPREDNISolone (SOLU-MEDROL) injection  40 mg Intravenous Q12H   pantoprazole  40 mg Oral Daily   rosuvastatin  20 mg Oral Daily   spironolactone  25 mg Oral Daily     PRN Meds: acetaminophen **OR** acetaminophen, ipratropium-albuterol, labetalol, ondansetron **OR** ondansetron (ZOFRAN) IV, oxyCODONE, senna-docusate, traZODone   Vital Signs    Vitals:   04/26/21 0600 04/26/21 0733 04/26/21 0738 04/26/21 0755  BP:    (!) 150/63  Pulse:      Resp: 13  20   Temp:   (!) 97.1 F (36.2 C)   TempSrc:   Oral   SpO2:  100%    Weight:      Height:        Intake/Output Summary (Last 24 hours) at 04/26/2021 0918 Last data filed at 04/26/2021 0100 Gross per 24 hour  Intake 480 ml  Output 2150 ml  Net -1670 ml   Filed Weights   04/23/21 0500 04/24/21 0400 04/25/21 0531  Weight: 108.7  kg 109.2 kg 109.9 kg    Telemetry    Sinus rhythm.  Personally reviewed.  ECG    No new ECG reviewed today.  Physical Exam   GEN: No acute distress.   Neck: No JVD. Cardiac: RRR, without gallop.  Physiologically split S2. Respiratory: Nonlabored.  Decreased breath sounds at the left base.  No wheezing or crackles. GI: Soft, nontender, bowel sounds present. MS: No pitting edema; No deformity.  Labs    Chemistry Recent Labs  Lab 04/21/21 0443 04/22/21 0449 04/24/21 0453 04/25/21 0424 04/26/21 0420  NA 137   < > 136 135 135  K 3.9   < > 4.2 4.2 4.3  CL 107   < > 104 103 103  CO2 20*   < > 20* 22 23  GLUCOSE 162*   < > 234* 309* 272*  BUN 104*   < > 111* 104* 105*  CREATININE 2.55*   < > 2.32* 2.35* 2.43*  CALCIUM 7.5*   < > 8.1* 8.1* 8.2*  PROT 6.3*  --   --   --   --   ALBUMIN 2.4*   < > 2.6* 2.5* 2.4*  AST  20  --   --   --   --   ALT 24  --   --   --   --   ALKPHOS 73  --   --   --   --   BILITOT 0.6  --   --   --   --   GFRNONAA 28*   < > 32* 31* 30*  ANIONGAP 10   < > _0 < > = values in this interval not displayed.     Hematology Recent Labs  Lab 04/21/21 0443  WBC 10.8*  RBC 2.85*  HGB 8.7*  HCT 27.3*  MCV 95.8  MCH 30.5  MCHC 31.9  RDW 14.2  PLT 276    Cardiac Enzymes Recent Labs  Lab 04/10/21 0009 04/10/21 0215  TROPONINIHS 91* 87*    Radiology    No results found.  Cardiac Studies   Echocardiogram 04/10/2021:  1. Left ventricular ejection fraction, by estimation, is 55 to 60%. The  left ventricle has normal function. The left ventricle has no regional  wall motion abnormalities. There is moderate concentric left ventricular  hypertrophy. Left ventricular  diastolic parameters are consistent with Grade II diastolic dysfunction  (pseudonormalization).   2. Right ventricular systolic function is normal. The right ventricular  size is normal. There is normal pulmonary artery systolic pressure. The  estimated right  ventricular systolic pressure is 91.6 mmHg.   3. Left atrial size was moderately dilated.   4. Right atrial size was mildly dilated.   5. The mitral valve is grossly normal. No evidence of mitral valve  regurgitation. No evidence of mitral stenosis.   6. The aortic valve is tricuspid. Aortic valve regurgitation is not  visualized. No aortic stenosis is present.   7. The inferior vena cava is normal in size with greater than 50%  respiratory variability, suggesting right atrial pressure of 3 mmHg.   Assessment & Plan    1.  Acute on chronic diastolic heart failure, LVEF 55 to 60% with moderate LVH, moderate diastolic dysfunction, and normal RV contraction as well as estimated RVSP.  Outside records from his primary cardiologist indicate negative PYP scan for TTR amyloidosis.  He continues to diurese on Bumex with relatively stable renal function.  Net output of approximately 2000 cc last 24 hours.  Question validity of climbing weights.  2.  AKI on CKD stage IIIb.  Baseline creatinine 1.7-2.1, currently stable at 2.43.  Nephrology is following.  3.  Uncontrolled type 2 diabetes mellitus.  Hemoglobin A1c 10.3%.  Presently on insulin, had been on Jardiance as an outpatient per records but not good candidate for SGLT2 inhibitor at this time due to degree of renal insufficiency.  4.  Essential hypertension, on multimodal therapy.  5.  Mixed hyperlipidemia, on Crestor.  Continue Coreg, Norvasc, clonidine, hydralazine, Imdur, Aldactone and Bumex.  He is not a candidate for ARB, ANRI, or SGLT2 inhibitor at this time.  Continuing to work on weaning oxygen requirement per Pulmonary team.  Looks to be multifactorial hypoxic respiratory failure with presenting pneumonia/infiltrates, also a fluid overload.  Continue to diurese as tolerated.  I wonder if a follow-up chest CT would be useful, defer to primary team.  Signed, Rozann Lesches, MD  04/26/2021, 9:18 AM

## 2021-04-26 NOTE — Plan of Care (Signed)
  Problem: Acute Rehab PT Goals(only PT should resolve) Goal: Pt Will Go Supine/Side To Sit Outcome: Progressing Flowsheets (Taken 04/26/2021 1340) Pt will go Supine/Side to Sit:  with modified independence  with supervision Goal: Patient Will Transfer Sit To/From Stand Outcome: Progressing Flowsheets (Taken 04/26/2021 1340) Patient will transfer sit to/from stand:  with modified independence  with supervision Goal: Pt Will Transfer Bed To Chair/Chair To Bed Outcome: Progressing Flowsheets (Taken 04/26/2021 1340) Pt will Transfer Bed to Chair/Chair to Bed:  with modified independence  with supervision Goal: Pt Will Ambulate Outcome: Progressing Flowsheets (Taken 04/26/2021 1340) Pt will Ambulate:  25 feet  with supervision  with rolling walker   1:41 PM, 04/26/21 Lonell Grandchild, MPT Physical Therapist with [facility] [phone] office [phone] mobile phone

## 2021-04-26 NOTE — Progress Notes (Signed)
Inpatient Diabetes Program Recommendations  AACE/ADA: New Consensus Statement on Inpatient Glycemic Control (2015)  Target Ranges:  Prepandial:   less than 140 mg/dL      Peak postprandial:   less than 180 mg/dL (1-2 hours)      Critically ill patients:  140 - 180 mg/dL   Lab Results  Component Value Date   GLUCAP 232 (H) 04/26/2021   HGBA1C 10.3 (H) 04/10/2021    Review of Glycemic Control  Results for MICHAEAL, DAVIS (MRN [id number]) as of 04/26/2021 08:52  Ref. Range 04/25/2021 07:43 04/25/2021 11:07 04/25/2021 16:00 04/25/2021 20:24 04/26/2021 07:37  Glucose-Capillary Latest Ref Range: 70 - 99 mg/dL 258 (H) 302 (H) 263 (H) 236 (H) 232 (H)   Diabetes history: Type 2 DM Outpatient Diabetes medications: Jardiance 10 mg QD, Novolog TID, Basaglar 30 units QD Current orders for Inpatient glycemic control:  Semglee 45 units QD Novolog 5 units TID Novolog 0-9 units & HS  Solumedrol 40 mg Q12  Inpatient Diabetes Program Recommendations:    If steroid dose remains the same consider:   -  Increase Semglee to 50 units -  Increase Novolog meal coverage to 7 units  Thanks, Bronson Curb, MSN, RNC-OB Diabetes Coordinator [phone] (8a-5p)
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
## 2021-04-27 ENCOUNTER — Inpatient Hospital Stay (HOSPITAL_COMMUNITY): Payer: Medicare Other

## 2021-04-27 DIAGNOSIS — J9622 Acute and chronic respiratory failure with hypercapnia: Secondary | ICD-10-CM | POA: Diagnosis not present

## 2021-04-27 DIAGNOSIS — R918 Other nonspecific abnormal finding of lung field: Secondary | ICD-10-CM | POA: Diagnosis not present

## 2021-04-27 DIAGNOSIS — I5033 Acute on chronic diastolic (congestive) heart failure: Secondary | ICD-10-CM | POA: Diagnosis not present

## 2021-04-27 DIAGNOSIS — J9621 Acute and chronic respiratory failure with hypoxia: Secondary | ICD-10-CM | POA: Diagnosis not present

## 2021-04-27 LAB — RENAL FUNCTION PANEL
Albumin: 2.5 g/dL — ABNORMAL LOW (ref 3.5–5.0)
Anion gap: 11 (ref 5–15)
BUN: 105 mg/dL — ABNORMAL HIGH (ref 6–20)
CO2: 22 mmol/L (ref 22–32)
Calcium: 8.4 mg/dL — ABNORMAL LOW (ref 8.9–10.3)
Chloride: 102 mmol/L (ref 98–111)
Creatinine, Ser: 2.44 mg/dL — ABNORMAL HIGH (ref 0.61–1.24)
GFR, Estimated: 30 mL/min — ABNORMAL LOW (ref >60)
Glucose, Bld: 271 mg/dL — ABNORMAL HIGH (ref 70–99)
Phosphorus: 3.5 mg/dL (ref 2.5–4.6)
Potassium: 4.2 mmol/L (ref 3.5–5.1)
Sodium: 135 mmol/L (ref 135–145)

## 2021-04-27 LAB — GLUCOSE, CAPILLARY
Glucose-Capillary: 226 mg/dL — ABNORMAL HIGH (ref 70–99)
Glucose-Capillary: 320 mg/dL — ABNORMAL HIGH (ref 70–99)
Glucose-Capillary: 290 mg/dL — ABNORMAL HIGH (ref 70–99)
Glucose-Capillary: 256 mg/dL — ABNORMAL HIGH (ref 70–99)

## 2021-04-27 MED ORDER — PREDNISONE 20 MG PO TABS
60.0000 mg | ORAL_TABLET | Freq: Every day | ORAL | Status: DC
  Administered 2021-04-28 – 2021-05-01 (×4): 60 mg via ORAL
  Filled 2021-04-27 (×4): qty 3

## 2021-04-27 MED ORDER — INSULIN GLARGINE-YFGN 100 UNIT/ML ~~LOC~~ SOLN
50.0000 [IU] | Freq: Every day | SUBCUTANEOUS | Status: DC
  Administered 2021-04-27 – 2021-04-28 (×2): 50 [IU] via SUBCUTANEOUS
  Filled 2021-04-27 (×3): qty 0.5

## 2021-04-27 MED ORDER — INSULIN ASPART 100 UNIT/ML IJ SOLN
7.0000 [IU] | Freq: Three times a day (TID) | INTRAMUSCULAR | Status: DC
  Administered 2021-04-27 – 2021-04-28 (×3): 7 [IU] via SUBCUTANEOUS

## 2021-04-27 NOTE — Progress Notes (Signed)
Patient Information  Patient Name  Keith Snow, Keith Snow ([id number]) Legal Sex  Male DOB  08-Apr-1961  Room Bed  A201 A201-01    Patient Demographics  Address  [address]  [facility] [place] [address] Phone  [phone] (Home) E-mail Address  [email or web address]    Basic Information  Date Of Birth  20-Apr-1961 Gender Identity  Male Race  Black or African American Ethnic Group  Not Hispanic or Latino Preferred Language  English    Patient Contacts  Name Relation Home Work Mobile  Keith Snow,Keith Snow Spouse [phone]      Documents on File   Status Date Received Description  Documents for the Patient  [place] OF PRIVACY - Scanned Not Received    [place] E-Signature HIPAA Notice of Privacy Signed 48/18/56   Driver's License Received 31/49/70 11/2  Insurance Card Received 05/26/17 11/2  Advance Directives/Living Will/HCPOA/POA Not Received    Other Photo ID Not Received    [place] E-Signature HIPAA Notice of Privacy Signed 04/10/21   Patient Photo   Photo of Patient  Documents for the Encounter  Waiver of MSE E-sig Signed 04/09/21   AOB  (Assignment of Insurance Benefits) Not Received    E-signature AOB Signed 04/10/21   MEDICARE RIGHTS Not Received    E-signature Medicare Rights Received 04/13/21   Annual Exam - E-Signature PCP     Disclosure No Surprises Act Received 04/10/21   ED Patient Billing Extract   ED PB Billing Extract  Cardiac Monitoring Strip Shift Summary Received 04/15/21   Cardiac Monitoring Strip Received 04/26/21   Echocardiogram Complete Received 04/10/21     Admission Information   Current Information  Attending Provider Admitting Provider Admission Type Admission Status  Keith Dubois, MD Rolla Plate, DO Emergency Confirmed Admission        Admission Date/Time Discharge Date Hospital Service Auth/Cert Status  26/37/85  09:47 PM  Internal Medicine [place] Unit Room/Bed   [place] A201/A201-01             Admission  Complaint  Hyperglycemia    Hospital Account  Name Acct ID Class Status Primary Coverage  [place], [place] [id number] Inpatient Open MEDICARE - MEDICARE PART A AND B        Guarantor Account (for [place] [id number])  Name Relation to Pt Service Area Active? Acct Type  Maryelizabeth Rowan Self [facility]SA Yes Personal/Family  Address Phone    [address] [place] [address] [phone])          Coverage Information (for Hospital Account [id number])   1. [place] PART A AND B  F/O Payor/Plan Precert #  MEDICARE/MEDICARE PART A AND B   Subscriber Subscriber #  Tayron, [place] [id number]  Address Phone  [address], [place] [address]    2. BLUE CROSS BLUE SHIELD/BCBS/FEDERAL EMP PPO  F/O Payor/Plan Precert #  BLUE CROSS BLUE SHIELD/BCBS/FEDERAL EMP PPO   Subscriber Subscriber #  Martavion, Couper [id number]  Address Phone  [address]  [place], [place] [address] [phone]          Care Everywhere ID:  [id number]

## 2021-04-27 NOTE — Progress Notes (Signed)
Inpatient Diabetes Program Recommendations  AACE/ADA: New Consensus Statement on Inpatient Glycemic Control (2015)  Target Ranges:  Prepandial:   less than 140 mg/dL      Peak postprandial:   less than 180 mg/dL (1-2 hours)      Critically ill patients:  140 - 180 mg/dL   Lab Results  Component Value Date   GLUCAP 226 (H) 04/27/2021   HGBA1C 10.3 (H) 04/10/2021    Review of Glycemic Control Results for SHEPARD, KELTZ (MRN [id number]) as of 04/27/2021 09:44  Ref. Range 04/26/2021 07:37 04/26/2021 11:44 04/26/2021 16:59 04/26/2021 20:20 04/27/2021 07:35  Glucose-Capillary Latest Ref Range: 70 - 99 mg/dL 232 (H) 282 (H) 349 (H) 343 (H) 226 (H)  Diabetes history: Type 2 DM Outpatient Diabetes medications: Jardiance 10 mg QD, Novolog TID, Basaglar 30 units QD Current orders for Inpatient glycemic control:  Semglee 45 units QD Novolog 5 units TID Novolog 0-9 units & HS Solumedrol 40 mg Q12 Inpatient Diabetes Program Recommendations:    Please increase Semglee to 55 units daily.  Also consider increasing Novolog meal coverage to 7 units tid with meals?   Thanks,  Adah Perl, RN, BC-ADM Inpatient Diabetes Coordinator Pager [phone]  (8a-5p)

## 2021-04-27 NOTE — Progress Notes (Signed)
PROGRESS NOTE  Keith Snow BJY:[id number] DOB: [birth year] DOA: 04/09/2021 PCP: System, Provider Not In   Brief History:  60 year old with history of DM2, HTN, MI, CHF comes to the ED with change in mental status.  Upon admission he was noted to have focal consolidation in the right lower lung, elevated BNP.  He was also hyperglycemic.  CT of the chest showed combination of severe bilateral bronchopneumonia and possible effusion.  It has been difficult to diurese him due to worsening renal function.  Nephrology and Pulm consulted for their input. Started on [employer] Spectrum Abx and Steroids. Cr continues to increase slowly.    Assessment/Plan: Acute hypoxic respiratory failure requiring BiPAP and HiFlow - Multifactorial.  Combination of PNA, COPD and CHF exacerbation. -9/19 CT chest Severe b/l BronchoPNA with small b/l parapneumonic effusions.  -9/27 CXR personally reviewed-unchanged diffuse bilateral pulm infiltrates -Still requiring high flow nasal cannula supplementation with intermittent use of BiPAP.  Continue medications and treatment as specified below -Appreciate assistance by pulmonology service, nephrology and cardiology service. -Patient continue to improve slowly. -Physical therapy recommending home health services. -Continue current treatment. -Continue weaning off oxygen supplementation as tolerated.   Community-acquired pneumonia. Aspiration PNA?  Concerns for BOOP - Pro-Cal 0.54>>0.34  -Cleared by speech and swallow for regular diet. - Currently on IV Zosyn 7/20>> - Bronchodilators scheduled and as needed.  I-S/flutter -Speech and swallow eval-regular diet -MRSA neg -9/26 discussed with Pulm, Dr. Alfonzo Beers to po steroids, and assess response. -patient has finished 7 days zosyn; remained afebrile. -And is for patient to pursued long/slow steroids. -Continue to follow response and further recommendations by pulmonology service. -Patient has continue to  demonstrate improvement slowly.     Diabetes mellitus type 2, uncontrolled with hyperglycemia - 9/17 A1c-10.3 - Continue current dose of long-acting insulin and continue sliding scale -Hypoglycemic agent has been adjusted secondary to elevated blood sugar in the setting of steroid usage. -Continue to follow CBGs -Modify carbohydrate diet has been recommended.   Acute on chronic congestive heart failure with preserved EF, grade 2 DD, class III -04/10/21 Echo 55-60%, grade 2 DD, moderate LVH - Lasix IV by nephrology>>transition to 1/2 home dose bumex -9/26 discussed with renal--Dr. Joelyn Oms -previously on Entresto, spironolactone which were held due to AKI. Per nephrology will resume spironolactone. -Continue to follow I's and O's -Continue low-sodium diet. -Patient expressed good urine output.   Acute kidney injury on CKD stage IIIa - Admission creatinine 1.8, 2.63 > 2.71 > 3.17 >3.49 >3.84>>3.04>>>2.32 -Currently producing about 1.5 L of urine over last 24 hours.   -BUN rising as well but this could also be secondary to steroids.  Potassium and bicarb are relatively stable.   -Renal ultrasound is negative for acute pathology.   -appreciate renal follow up and recommendations. -he does not look to be florridly fluid overloaded -previously on Bumex 2 mg bid when he lived in [place] -Volume status has remained stable -Continue the use of current dose of Bumex and spironolactone as recommended by nephrology..   Acute metabolic encephalopathy -Resolved.  -Mentation back to baseline. -Patient is oriented x3 and following commands appropriately.   Essential hypertension - Continue the use of Coreg,  hydralazine, clonidine, imdur and amlodipine. -nifedipine stopped due to concerns for shunt -Continue to follow vital signs and further adjust antihypertensive treatment as needed -Following nephrology service recommendation and spironolactone has been added.  Hyperlipidemia - Continue the use  of Crestor   Status is: Inpatient  Remains inpatient appropriate because:Inpatient level of care appropriate due to severity of illness   Dispo: The patient is from: Home              Anticipated d/c is to: Home              Patient currently is not medically stable to d/c.  Making progress and hopefully will fully stabilized for home discharge in the next 2-3 days.              Difficult to place patient No          Family Communication:   Wife updated at bedside.   Consultants:  pulm   Code Status:  FULL    DVT Prophylaxis:  [place] Heparin     Procedures: As Listed in Progress Note Above   Antibiotics: Zosyn 9/20>>9/27     Subjective: Continue reporting good urine output; no chest pain, no nausea, no vomiting.  Desaturating on room air; short winded with activity.  Continue slowly improving.  Objective: Vitals:   04/27/21 0600 04/27/21 0631 04/27/21 0745 04/27/21 1345  BP: (!) 168/87   (!) 159/69  Pulse:    65  Resp:    15  Temp:    98 F (36.7 C)  TempSrc:    Oral  SpO2:  94% (!) 88% 94%  Weight:      Height:        Intake/Output Summary (Last 24 hours) at 04/27/2021 1412 Last data filed at 04/27/2021 1300 Gross per 24 hour  Intake 720 ml  Output 5050 ml  Net -4330 ml   Weight change:   Exam: General exam: Alert, awake, oriented x 3; no chest pain, no nausea, no vomiting.  Still short winded with activity and desaturating on room air; patient is demonstrating the need of 4-5 L nasal cannula supplementation to keep O2 sat above 90%. Respiratory system: No frank crackles, decreased breath sounds at the bases, no using accessory muscles.  Positive rhonchi. Cardiovascular system:RRR. No murmurs, rubs, gallops.  No JVD. Gastrointestinal system: Abdomen is nondistended, soft and nontender. No organomegaly or masses felt. Normal bowel sounds heard. Central nervous system: Alert and oriented. No focal neurological deficits. Extremities: No cyanosis or clubbing;  trace edema appreciated. Skin: No petechiae. Psychiatry: Judgement and insight appear normal. Mood & affect appropriate.     Data Reviewed: I have personally reviewed following labs and imaging studies  Basic Metabolic Panel: Recent Labs  Lab 04/23/21 0433 04/24/21 0453 04/25/21 0424 04/26/21 0420 04/27/21 0439  NA 134* 136 135 135 135  K 4.7 4.2 4.2 4.3 4.2  CL 102 104 103 103 102  CO2 22 20* 22 23 22   GLUCOSE 345* 234* 309* 272* 271*  BUN 108* 111* 104* 105* 105*  CREATININE 2.54* 2.32* 2.35* 2.43* 2.44*  CALCIUM 7.7* 8.1* 8.1* 8.2* 8.4*  PHOS 4.7* 4.3 3.9 3.4 3.5   Liver Function Tests: Recent Labs  Lab 04/21/21 0443 04/22/21 0449 04/23/21 0433 04/24/21 0453 04/25/21 0424 04/26/21 0420 04/27/21 0439  AST 20  --   --   --   --   --   --   ALT 24  --   --   --   --   --   --   ALKPHOS 73  --   --   --   --   --   --   BILITOT 0.6  --   --   --   --   --   --  PROT 6.3*  --   --   --   --   --   --   ALBUMIN 2.4*   < > 2.4* 2.6* 2.5* 2.4* 2.5*   < > = values in this interval not displayed.   CBC: Recent Labs  Lab 04/21/21 0443  WBC 10.8*  HGB 8.7*  HCT 27.3*  MCV 95.8  PLT 276    CBG: Recent Labs  Lab 04/26/21 1144 04/26/21 1659 04/26/21 2020 04/27/21 0735 04/27/21 1139  GLUCAP 282* 349* 343* 226* 320*   Urine analysis:    Component Value Date/Time   COLORURINE YELLOW 04/14/2021 1803   APPEARANCEUR CLEAR 04/14/2021 1803   LABSPEC >1.030 (H) 04/14/2021 1803   PHURINE 5.0 04/14/2021 1803   GLUCOSEU NEGATIVE 04/14/2021 1803   HGBUR NEGATIVE 04/14/2021 1803   BILIRUBINUR NEGATIVE 04/14/2021 1803   KETONESUR NEGATIVE 04/14/2021 1803   PROTEINUR TRACE (A) 04/14/2021 1803   NITRITE NEGATIVE 04/14/2021 1803   LEUKOCYTESUR NEGATIVE 04/14/2021 1803   No results found for this or any previous visit (from the past 240 hour(s)).   Scheduled Meds:  amLODipine  10 mg Oral Daily   aspirin EC  81 mg Oral Daily   budesonide (PULMICORT) nebulizer  solution  0.5 mg Nebulization BID   bumetanide  3 mg Oral BID   buPROPion  100 mg Oral BID   carvedilol  25 mg Oral BID WC   Chlorhexidine Gluconate Cloth  6 each Topical Daily   cloNIDine  0.3 mg Oral TID   ferrous sulfate  325 mg Oral Q breakfast   heparin  5,000 Units Subcutaneous Q8H   hydrALAZINE  100 mg Oral Q8H   insulin aspart  0-5 Units Subcutaneous QHS   insulin aspart  0-9 Units Subcutaneous TID WC   insulin aspart  7 Units Subcutaneous TID WC   insulin glargine-yfgn  50 Units Subcutaneous QHS   ipratropium-albuterol  3 mL Nebulization Q6H   isosorbide mononitrate  30 mg Oral BID   mouth rinse  15 mL Mouth Rinse BID   pantoprazole  40 mg Oral Daily   [START ON 04/28/2021] predniSONE  60 mg Oral Q breakfast   rosuvastatin  20 mg Oral Daily   spironolactone  25 mg Oral BID   Procedures/Studies: CT CHEST WO CONTRAST  Result Date: 04/12/2021 CLINICAL DATA:  60 year old male with history of acute hypoxic respiratory failure requiring BiPAP. EXAM: CT CHEST WITHOUT CONTRAST TECHNIQUE: Multidetector CT imaging of the chest was performed following the standard protocol without IV contrast. COMPARISON:  No priors. FINDINGS: Cardiovascular: Heart size is normal. There is no significant pericardial fluid, thickening or pericardial calcification. There is aortic atherosclerosis, as well as atherosclerosis of the great vessels of the mediastinum and the coronary arteries, including calcified atherosclerotic plaque in the left anterior descending and left circumflex coronary arteries. Mediastinum/Nodes: Multiple prominent borderline enlarged mediastinal and bilateral hilar lymph nodes are noted, nonspecific and likely reactive. Esophagus is unremarkable in appearance. No axillary lymphadenopathy. Lungs/Pleura: Small bilateral pleural effusions (right greater than left) lying dependently. Patchy multifocal airspace consolidation most evident in a peribronchovascular distribution with surrounding  ground-glass attenuation and septal thickening. Upper Abdomen: Unremarkable. Musculoskeletal: There are no aggressive appearing lytic or blastic lesions noted in the visualized portions of the skeleton. Multiple old healed rib fractures. IMPRESSION: 1. Severe multilobar bilateral bronchopneumonia with small bilateral parapneumonic pleural effusions lying dependently, as above. 2. Aortic atherosclerosis, in addition to 2 vessel coronary artery disease. Please note that although the presence  of coronary artery calcium documents the presence of coronary artery disease, the severity of this disease and any potential stenosis cannot be assessed on this non-gated CT examination. Assessment for potential risk factor modification, dietary therapy or pharmacologic therapy may be warranted, if clinically indicated. Aortic Atherosclerosis (ICD10-I70.0). Electronically Signed   By: Vinnie Langton M.D.   On: 04/12/2021 18:58   US RENAL  Result Date: 04/14/2021 CLINICAL DATA:  Acute renal injury EXAM: RENAL / URINARY TRACT ULTRASOUND COMPLETE COMPARISON:  None. FINDINGS: Right Kidney: Renal measurements: 12.3 x 4.5 x 5.7 cm. = volume: 165 mL. Echogenicity within normal limits. No mass or hydronephrosis visualized. Left Kidney: Renal measurements: 10.9 x 6.0 x 4.9 cm. = volume: 66 mL. Echogenicity within normal limits. No mass or hydronephrosis visualized. Bladder: Bladder is well distended. Mild wall thickening is noted anteriorly measuring up to 9 mm. This could be related to underlying UTI. Clinical correlation is recommended. Other: None. IMPRESSION: Normal-appearing kidneys bilaterally. Mild bladder wall thickening which may be inflammatory in nature. Clinical correlation is recommended. Electronically Signed   By: Inez Catalina M.D.   On: 04/14/2021 15:54   DG Chest Port 1 View  Result Date: 04/27/2021 CLINICAL DATA:  Follow-up pulmonary infiltrates EXAM: PORTABLE CHEST 1 VIEW COMPARISON:  04/21/2021 FINDINGS:  Cardiac shadow is enlarged but stable. Patchy airspace opacity is identified bilaterally with small left pleural effusion stable in appearance from the prior exam. No bony abnormality is noted. IMPRESSION: No change from previous exam. Electronically Signed   By: Inez Catalina M.D.   On: 04/27/2021 00:33   DG Chest Port 1 View  Result Date: 04/21/2021 CLINICAL DATA:  Shortness of breath EXAM: PORTABLE CHEST 1 VIEW COMPARISON:  04/21/2021 FINDINGS: Bilateral interstitial and patchy alveolar airspace opacities. Small left pleural effusion. No pneumothorax. Stable cardiomegaly. No acute osseous abnormality. IMPRESSION: 1. Cardiomegaly with bilateral interstitial and alveolar airspace opacities and trace left pleural effusion. Differential considerations include pulmonary edema versus multilobar pneumonia. Electronically Signed   By: Kathreen Devoid M.D.   On: 04/21/2021 19:31   DG CHEST PORT 1 VIEW  Result Date: 04/21/2021 CLINICAL DATA:  Hypoxia EXAM: PORTABLE CHEST 1 VIEW COMPARISON:  Chest x-ray dated April 20, 2021 FINDINGS: Unchanged cardiomegaly. Bilateral heterogeneous opacities with slightly more focal appearance the right mid lung, likely due to superimposed atelectasis. Small bilateral pleural effusions. No evidence of pneumothorax. IMPRESSION: Similar bilateral heterogeneous pulmonary opacities and small bilateral pleural effusions. Electronically Signed   By: Yetta Glassman M.D.   On: 04/21/2021 08:11   DG Chest Port 1 View  Result Date: 04/20/2021 CLINICAL DATA:  Acute respiratory failure EXAM: PORTABLE CHEST 1 VIEW COMPARISON:  04/14/2021 FINDINGS: Bilateral airspace disease again noted, unchanged. Heart is mildly enlarged. Possible small layering left effusion. Scattered aortic calcifications. No acute bony abnormality. IMPRESSION: Diffuse bilateral airspace disease, unchanged. Suspect layering left effusion. Electronically Signed   By: Rolm Baptise M.D.   On: 04/20/2021 08:02   DG Chest  Port 1 View  Result Date: 04/14/2021 CLINICAL DATA:  Shortness of breath, history of stroke, diabetes mellitus, hypertension, MI, cancer EXAM: PORTABLE CHEST 1 VIEW COMPARISON:  Portable exam 1340 hours compared to 04/10/2021 FINDINGS: Enlargement of cardiac silhouette with pulmonary vascular congestion. Stable mediastinal contours. BILATERAL pulmonary infiltrates question pulmonary edema, slightly greater at RIGHT base. Small BILATERAL pleural effusions. No pneumothorax or acute osseous findings. IMPRESSION: Enlargement of cardiac silhouette with pulmonary vascular congestion and mild pulmonary edema. Small BILATERAL pleural effusions. Electronically Signed   By:  Lavonia Dana M.D.   On: 04/14/2021 16:01   DG CHEST PORT 1 VIEW  Result Date: 04/10/2021 CLINICAL DATA:  Weakness and fatigue. EXAM: PORTABLE CHEST 1 VIEW COMPARISON:  04/09/2021 FINDINGS: 0657 hours. The cardio pericardial silhouette is enlarged. Focal airspace disease again noted right base with some probable atelectasis or infiltrate in the retrocardiac left base. There is pulmonary vascular congestion without overt pulmonary edema. Superimposed component of interstitial pulmonary edema suspected. IMPRESSION: Asymmetric focal airspace disease right base with some probable atelectasis or infiltrate in the retrocardiac left base. Interstitial edema not excluded. Electronically Signed   By: Misty Stanley M.D.   On: 04/10/2021 07:33   DG Chest Port 1 View  Result Date: 04/09/2021 CLINICAL DATA:  Fever and weakness EXAM: PORTABLE CHEST 1 VIEW COMPARISON:  None. FINDINGS: The heart and mediastinal contours are within normal limits. Aortic calcification. Right lower lung zone focal consolidation. Question retrocardiac opacity. No pulmonary edema. Possible trace left pleural effusion. No right pleural effusion. No pneumothorax. No acute osseous abnormality. IMPRESSION: Right lower lung zone focal consolidation. Question retrocardiac opacity with  possible trace left pleural effusion. Findings consistent with infection/inflammation. Followup PA and lateral chest X-ray is recommended in 3-4 weeks following therapy to ensure resolution and exclude underlying malignancy. Electronically Signed   By: Iven Finn M.D.   On: 04/09/2021 23:59   ECHOCARDIOGRAM COMPLETE  Result Date: 04/10/2021    ECHOCARDIOGRAM REPORT   Patient Name:   CHISTOPHER Mui Date of Exam: 04/10/2021 Medical Rec #:  [id number]    Height:       69.0 in Accession #:    [id number]   Weight:       217.0 lb Date of Birth:  July 21, 1961    BSA:          2.139 m Patient Age:    83 years     BP:           142/83 mmHg Patient Gender: M            HR:           85 bpm. Exam Location:  [facility] Procedure: 2D Echo, Cardiac Doppler and Color Doppler Indications:    SBE I33.9  History:        Patient has no prior history of Echocardiogram examinations.                 Previous Myocardial Infarction, Stroke; Risk                 Factors:Hypertension and Diabetes. Hx of cancer. 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  Sonographer:    Alvino Chapel RCS Referring Phys: [place]  1. Left ventricular ejection fraction, by estimation, is 55 to 60%. The left ventricle has normal function. The left ventricle has no regional wall motion abnormalities. There is moderate concentric left ventricular hypertrophy. Left ventricular diastolic parameters are consistent with Grade II diastolic dysfunction (pseudonormalization).  2. Right ventricular systolic function is normal. The right ventricular size is normal. There is normal pulmonary artery systolic pressure. The estimated right ventricular systolic pressure is 44.0 mmHg.  3. Left atrial size was moderately dilated.  4. Right atrial size was mildly dilated.  5. The mitral valve is grossly normal. No evidence of mitral valve regurgitation. No evidence of mitral stenosis.  6. The aortic valve is tricuspid. Aortic  valve  regurgitation is not visualized. No aortic stenosis is present.  7. The inferior vena cava is normal in size with greater than 50% respiratory variability, suggesting right atrial pressure of 3 mmHg. FINDINGS  Left Ventricle: Left ventricular ejection fraction, by estimation, is 55 to 60%. The left ventricle has normal function. The left ventricle has no regional wall motion abnormalities. The left ventricular internal cavity size was normal in size. There is  moderate concentric left ventricular hypertrophy. Left ventricular diastolic parameters are consistent with Grade II diastolic dysfunction (pseudonormalization). Right Ventricle: The right ventricular size is normal. No increase in right ventricular wall thickness. Right ventricular systolic function is normal. There is normal pulmonary artery systolic pressure. The tricuspid regurgitant velocity is 2.10 m/s, and  with an assumed right atrial pressure of 3 mmHg, the estimated right ventricular systolic pressure is 70.1 mmHg. Left Atrium: Left atrial size was moderately dilated. Right Atrium: Right atrial size was mildly dilated. Pericardium: There is no evidence of pericardial effusion. Mitral Valve: The mitral valve is grossly normal. No evidence of mitral valve regurgitation. No evidence of mitral valve stenosis. Tricuspid Valve: The tricuspid valve is grossly normal. Tricuspid valve regurgitation is trivial. No evidence of tricuspid stenosis. Aortic Valve: The aortic valve is tricuspid. Aortic valve regurgitation is not visualized. No aortic stenosis is present. Pulmonic Valve: The pulmonic valve was grossly normal. Pulmonic valve regurgitation is not visualized. No evidence of pulmonic stenosis. Aorta: The aortic root is normal in size and structure. Venous: The inferior vena cava is normal in size with greater than 50% respiratory variability, suggesting right atrial pressure of 3 mmHg. IAS/Shunts: The atrial septum is grossly normal.  LEFT  VENTRICLE PLAX 2D LVIDd:         4.70 cm  Diastology LVIDs:         3.10 cm  LV e' medial:    6.74 cm/s LV PW:         1.50 cm  LV E/e' medial:  16.5 LV IVS:        1.50 cm  LV e' lateral:   9.14 cm/s LVOT diam:     2.00 cm  LV E/e' lateral: 12.1 LV SV:         65 LV SV Index:   31 LVOT Area:     3.14 cm  RIGHT VENTRICLE RV S prime:     13.80 cm/s TAPSE (M-mode): 2.6 cm LEFT ATRIUM             Index       RIGHT ATRIUM           Index LA diam:        4.20 cm 1.96 cm/m  RA Area:     24.30 cm LA Vol (A2C):   79.4 ml 37.12 ml/m RA Volume:   81.40 ml  38.06 ml/m LA Vol (A4C):   93.6 ml 43.76 ml/m LA Biplane Vol: 86.2 ml 40.30 ml/m  AORTIC VALVE LVOT Vmax:   102.00 cm/s LVOT Vmean:  62.500 cm/s LVOT VTI:    0.208 m  AORTA Ao Root diam: 3.60 cm MITRAL VALVE                TRICUSPID VALVE MV Area (PHT): 4.15 cm     TR Peak grad:   17.6 mmHg MV Decel Time: 183 msec     TR Vmax:        210.00 cm/s MV E velocity: 111.00 cm/s MV A velocity: 79.70 cm/s   SHUNTS  MV E/A ratio:  1.39         Systemic VTI:  0.21 m                             Systemic Diam: 2.00 cm Eleonore Chiquito MD Electronically signed by Eleonore Chiquito MD Signature Date/Time: 04/10/2021/1:25:55 PM    Final    [place] EKG SITE RITE  Result Date: 04/17/2021 If Site Rite image not attached, placement could not be confirmed due to current cardiac rhythm.   Barton Dubois, MD  [facility]  If 7PM-7AM, please contact night-coverage [email or web address]  Password [email or web address] 04/27/2021, 2:12 PM   LOS: 17 days

## 2021-04-27 NOTE — Progress Notes (Signed)
Progress Note  Patient Name: Keith Snow Date of Encounter: 04/27/2021  Primary Cardiologist: Gwen Pounds, MD  Subjective   No shortness of breath at rest, no cough, no chest pain.  Oxygen requirement still improving.  Inpatient Medications    Scheduled Meds:  amLODipine  10 mg Oral Daily   aspirin EC  81 mg Oral Daily   budesonide (PULMICORT) nebulizer solution  0.5 mg Nebulization BID   bumetanide  3 mg Oral BID   buPROPion  100 mg Oral BID   carvedilol  25 mg Oral BID WC   Chlorhexidine Gluconate Cloth  6 each Topical Daily   cloNIDine  0.3 mg Oral TID   ferrous sulfate  325 mg Oral Q breakfast   heparin  5,000 Units Subcutaneous Q8H   hydrALAZINE  100 mg Oral Q8H   insulin aspart  0-5 Units Subcutaneous QHS   insulin aspart  0-9 Units Subcutaneous TID WC   insulin aspart  5 Units Subcutaneous TID WC   insulin glargine-yfgn  45 Units Subcutaneous QHS   ipratropium-albuterol  3 mL Nebulization Q6H   isosorbide mononitrate  30 mg Oral BID   mouth rinse  15 mL Mouth Rinse BID   methylPREDNISolone (SOLU-MEDROL) injection  40 mg Intravenous Q12H   pantoprazole  40 mg Oral Daily   rosuvastatin  20 mg Oral Daily   spironolactone  25 mg Oral BID     PRN Meds: acetaminophen **OR** acetaminophen, ipratropium-albuterol, labetalol, ondansetron **OR** ondansetron (ZOFRAN) IV, oxyCODONE, senna-docusate, traZODone   Vital Signs    Vitals:   04/27/21 0500 04/27/21 0600 04/27/21 0631 04/27/21 0745  BP:  (!) 168/87    Pulse:      Resp:      Temp:      TempSrc:      SpO2:   94% (!) 88%  Weight: 111.7 kg     Height:        Intake/Output Summary (Last 24 hours) at 04/27/2021 0933 Last data filed at 04/27/2021 0800 Gross per 24 hour  Intake 480 ml  Output 5000 ml  Net -4520 ml   Filed Weights   04/25/21 0531 04/26/21 2022 04/27/21 0500  Weight: 109.9 kg 113.4 kg 111.7 kg    Telemetry    Sinus rhythm.  Personally reviewed.  ECG    No new ECG reviewed  today.  Physical Exam   GEN: No acute distress.   Neck: No JVD. Cardiac: RRR without gallop.  Physiologically split S2. Respiratory: Nonlabored.  Decreased breath sounds at the left base.  No wheezing or crackles. GI: Soft, nontender, bowel sounds present. MS: No pitting edema; No deformity.  Labs    Chemistry Recent Labs  Lab 04/21/21 0443 04/22/21 0449 04/25/21 0424 04/26/21 0420 04/27/21 0439  NA 137   < > 135 135 135  K 3.9   < > 4.2 4.3 4.2  CL 107   < > 103 103 102  CO2 20*   < > 22 23 22   GLUCOSE 162*   < > 309* 272* 271*  BUN 104*   < > 104* 105* 105*  CREATININE 2.55*   < > 2.35* 2.43* 2.44*  CALCIUM 7.5*   < > 8.1* 8.2* 8.4*  PROT 6.3*  --   --   --   --   ALBUMIN 2.4*   < > 2.5* 2.4* 2.5*  AST 20  --   --   --   --   ALT 24  --   --   --   --  ALKPHOS 73  --   --   --   --   BILITOT 0.6  --   --   --   --   GFRNONAA 28*   < > 31* 30* 30*  ANIONGAP 10   < > 10 9 11    < > = values in this interval not displayed.     Hematology Recent Labs  Lab 04/21/21 0443  WBC 10.8*  RBC 2.85*  HGB 8.7*  HCT 27.3*  MCV 95.8  MCH 30.5  MCHC 31.9  RDW 14.2  PLT 276    Cardiac Enzymes Recent Labs  Lab 04/10/21 0009 04/10/21 0215  TROPONINIHS 91* 70*    Radiology    DG Chest Port 1 View  Result Date: 04/27/2021 CLINICAL DATA:  Follow-up pulmonary infiltrates EXAM: PORTABLE CHEST 1 VIEW COMPARISON:  04/21/2021 FINDINGS: Cardiac shadow is enlarged but stable. Patchy airspace opacity is identified bilaterally with small left pleural effusion stable in appearance from the prior exam. No bony abnormality is noted. IMPRESSION: No change from previous exam. Electronically Signed   By: Inez Catalina M.D.   On: 04/27/2021 00:33    Cardiac Studies   Echocardiogram 04/10/2021:  1. Left ventricular ejection fraction, by estimation, is 55 to 60%. The  left ventricle has normal function. The left ventricle has no regional  wall motion abnormalities. There is moderate  concentric left ventricular  hypertrophy. Left ventricular  diastolic parameters are consistent with Grade II diastolic dysfunction  (pseudonormalization).   2. Right ventricular systolic function is normal. The right ventricular  size is normal. There is normal pulmonary artery systolic pressure. The  estimated right ventricular systolic pressure is 41.6 mmHg.   3. Left atrial size was moderately dilated.   4. Right atrial size was mildly dilated.   5. The mitral valve is grossly normal. No evidence of mitral valve  regurgitation. No evidence of mitral stenosis.   6. The aortic valve is tricuspid. Aortic valve regurgitation is not  visualized. No aortic stenosis is present.   7. The inferior vena cava is normal in size with greater than 50%  respiratory variability, suggesting right atrial pressure of 3 mmHg.   Assessment & Plan    1.  Acute on chronic diastolic heart failure, LVEF 55 to 60% with moderate LVH, moderate diastolic dysfunction, and normal RV contraction as well as estimated RVSP.  Outside records from his primary cardiologist indicate negative PYP scan for TTR amyloidosis.  He continues to diurese on Bumex with stable renal function.  Net urine output of approximately 4700 cc last 24 hours.  2.  AKI on CKD stage IIIb.  Baseline creatinine 1.7-2.1, currently stable at 2.44.  Nephrology has signed off.  3.  Uncontrolled type 2 diabetes mellitus.  Hemoglobin A1c 10.3%.  Presently on insulin, had been on Jardiance as an outpatient per records but not good candidate for SGLT2 inhibitor at this time due to degree of renal insufficiency.  4.  Essential hypertension, on multimodal therapy.  5.  Mixed hyperlipidemia, on Crestor.  Continue Coreg, Norvasc, clonidine, hydralazine, Imdur, Aldactone and Bumex.  He is not a candidate for ARB, ANRI, or SGLT2 inhibitor at this time, but if creatinine improves closer to baseline he may be able to resume Entresto with simplification of  current regimen under direction of his primary cardiologist.  Oxygen requirement down to 2 L via nasal cannula, may be able to consider discharge with oxygen supplementation and close follow-up with his providers in [place].  Looks to be multifactorial hypoxic respiratory failure with presenting pneumonia/infiltrates, also a fluid overload.  Continue to diurese as tolerated.  Signed, Rozann Lesches, MD  04/27/2021, 9:33 AM

## 2021-04-27 NOTE — TOC Progression Note (Signed)
Transition of Care [facility]) - Progression Note    Patient Details  Name: Keith Snow MRN: [id number] Date of Birth: 02-Jun-1961  Transition of Care [facility]) CM/SW Contact  Salome Arnt, [place] Phone Number: 04/27/2021, 12:55 PM  Clinical Narrative:  Pt progressing towards d/c. LCSW discussed home health and home O2 needs with pt. Pt agreeable to [facility] for home O2. He requested that LCSW speak with his wife about home health in [place] as he will be returning home immediately upon d/c. Pt's wife requests [facility] [phone]) first, then [facility] [place] [phone]). Referral faxed to [facility] [phone]). Home health PT/RN orders in. Referral made to [facility] with [facility] for O2. Home O2 orders in. Awaiting sat qualification note. TOC will continue to follow.     Expected Discharge Plan: Home/Self Care Barriers to Discharge: Continued Medical Work up  Expected Discharge Plan and Services Expected Discharge Plan: Home/Self Care       Living arrangements for the past 2 months: Single Family Home                 DME Arranged: Oxygen DME Agency: [facility] Date DME Agency Contacted: 04/19/21 Time DME Agency Contacted: [phone] Representative spoke with at DME Agency: [place] ([place]) Interventions    Readmission Risk Interventions No flowsheet data found.
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
## 2021-04-27 NOTE — Progress Notes (Signed)
SATURATION QUALIFICATIONS: (This note is used to comply with regulatory documentation for home oxygen)  Patient Saturations on Room Air at Rest = 79%   

## 2021-04-28 DIAGNOSIS — I5033 Acute on chronic diastolic (congestive) heart failure: Secondary | ICD-10-CM | POA: Diagnosis not present

## 2021-04-28 LAB — RENAL FUNCTION PANEL
Albumin: 2.3 g/dL — ABNORMAL LOW (ref 3.5–5.0)
Anion gap: 10 (ref 5–15)
BUN: 101 mg/dL — ABNORMAL HIGH (ref 6–20)
CO2: 24 mmol/L (ref 22–32)
Calcium: 8.4 mg/dL — ABNORMAL LOW (ref 8.9–10.3)
Chloride: 102 mmol/L (ref 98–111)
Creatinine, Ser: 2.46 mg/dL — ABNORMAL HIGH (ref 0.61–1.24)
GFR, Estimated: 30 mL/min — ABNORMAL LOW (ref >60)
Glucose, Bld: 233 mg/dL — ABNORMAL HIGH (ref 70–99)
Phosphorus: 3.4 mg/dL (ref 2.5–4.6)
Potassium: 3.8 mmol/L (ref 3.5–5.1)
Sodium: 136 mmol/L (ref 135–145)

## 2021-04-28 LAB — GLUCOSE, CAPILLARY
Glucose-Capillary: 201 mg/dL — ABNORMAL HIGH (ref 70–99)
Glucose-Capillary: 151 mg/dL — ABNORMAL HIGH (ref 70–99)
Glucose-Capillary: 330 mg/dL — ABNORMAL HIGH (ref 70–99)

## 2021-04-28 LAB — GLUCOSE, RANDOM: Glucose, Bld: 370 mg/dL — ABNORMAL HIGH (ref 70–99)

## 2021-04-28 MED ORDER — INSULIN ASPART 100 UNIT/ML IJ SOLN
8.0000 [IU] | Freq: Three times a day (TID) | INTRAMUSCULAR | Status: DC
  Administered 2021-04-28: 8 [IU] via SUBCUTANEOUS
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
## 2021-04-28 NOTE — TOC Progression Note (Signed)
Transition of Care [facility]) - Progression Note    Patient Details  Name: Keith Snow MRN: [id number] Date of Birth: February 11, 1961  Transition of Care [facility]) CM/SW Contact  Shade Flood, LCSW Phone Number: 04/28/2021, 10:36 AM  Clinical Narrative:     TOC following. Pt not yet medically stable for dc per MD. Will continue to follow for home O2 and HH at dc.  Expected Discharge Plan: Home/Self Care Barriers to Discharge: Continued Medical Work up  Expected Discharge Plan and Services Expected Discharge Plan: Home/Self Care       Living arrangements for the past 2 months: Single Family Home                 DME Arranged: Oxygen DME Agency: [facility] Date DME Agency Contacted: 04/19/21 Time DME Agency Contacted: [phone] Representative spoke with at DME Agency: [place] ([place]) Interventions    Readmission Risk Interventions No flowsheet data found.

## 2021-04-28 NOTE — Progress Notes (Signed)
PROGRESS NOTE  Keith Snow WPY:[id number] DOB: July 06, 1961 DOA: 04/09/2021 PCP: System, Provider Not In   Brief History:  60 year old with history of DM2, HTN, MI, CHF comes to the ED with change in mental status.  Upon admission he was noted to have focal consolidation in the right lower lung, elevated BNP.  He was also hyperglycemic.  CT of the chest showed combination of severe bilateral bronchopneumonia and possible effusion.  It has been difficult to diurese him due to worsening renal function.  Nephrology and Pulm consulted for their input. Started on Broad Spectrum Abx and Steroids. Cr continues to increase slowly.    Assessment/Plan: Acute hypoxic respiratory failure requiring BiPAP and HiFlow - Multifactorial.  Combination of PNA, COPD and CHF exacerbation. -04/12/21 --CT chest Severe b/l BronchoPNA with small b/l parapneumonic effusions.  -04/20/21---CXR personally reviewed-unchanged diffuse bilateral pulm infiltrates -Still requiring  nasal cannula supplementation at 5L/min with intermittent use of BiPAP.  --Appreciate assistance by pulmonology service, nephrology and cardiology service. -Patient continue to improve slowly- -Physical therapy recommending home health services. -Continue weaning off oxygen supplementation as tolerated. --DOE persist, Hypoxia persist, desaturates quickly   Community-acquired pneumonia. Aspiration PNA?  Concerns for BOOP - Pro-Cal 0.54>>0.34  -Cleared by speech and swallow for regular diet. - Bronchodilators scheduled and as needed.  I-S/flutter -Speech and swallow eval-regular diet -MRSA neg -04/19/21-Pulmonology- Dr. Alfonzo Beers to po steroids, and assess response. -patient has finished 7 days zosyn; remained afebrile. -c/n long/slow steroids. -Continue to follow response and further recommendations by pulmonology service.  Diabetes mellitus type 2, uncontrolled with hyperglycemia - 04/10/21 A1c-10.3 - Continue current dose of  long-acting insulin and continue sliding scale -Anticipate erratic sugars with steroid therapy  Acute on chronic congestive heart failure with preserved EF, grade 2 DD, class III -04/10/21 Echo 55-60%, grade 2 DD, moderate LVH - Lasix IV by nephrology>>transition to 1/2 home dose bumex -9/26 discussed with renal--Dr. Joelyn Oms -previously on Entresto, spironolactone which were held due to AKI. Per nephrology will resume spironolactone. -Continue to follow I's and O's -Continue low-sodium diet. -Patient expressed good urine output.   Acute kidney injury on CKD stage IIIa - Admission creatinine 1.8, 2.63 > 2.71 > 3.17 >3.49 >3.84>>3.04>>>2.32 -Currently producing about 1.5 L of urine over last 24 hours.   -BUN rising as well but this could also be secondary to steroids.  Potassium and bicarb are relatively stable.   -Renal ultrasound is negative for acute pathology.   -appreciate renal follow up and recommendations. -he does not look to be florridly fluid overloaded -previously on Bumex 2 mg bid when he lived in [place] -Volume status has remained stable -Continue the use of current dose of Bumex and spironolactone as recommended by nephrology..   Acute metabolic encephalopathy -Resolved.  -Mentation back to baseline. -Patient is oriented x3 and following commands appropriately.   Essential hypertension - Continue the use of Coreg,  hydralazine, clonidine, imdur and amlodipine. -nifedipine stopped due to concerns for shunt -Continue to follow vital signs and further adjust antihypertensive treatment as needed -Following nephrology service recommendation and spironolactone has been added.  Hyperlipidemia - Continue the use of Crestor   Status is: Inpatient   Remains inpatient appropriate because:Inpatient level of care appropriate due to severity of illness   Dispo: The patient is from: Home              Anticipated d/c is to: Home with home O2  Patient currently is not  medically stable to d/c.  Making progress and hopefully will fully stabilized for home discharge in the next 2-3 days-hypoxia and dyspnea on exertion continues to improve              Difficult to place patient No          Family Communication:   Wife updated    Consultants:  pulm   Code Status:  FULL    DVT Prophylaxis:  [place] Heparin     Procedures: As Listed in Progress Note Above   Antibiotics: Zosyn 9/20>>9/27     Subjective: -DOE persist Hypoxia persist, desaturates quickly, voiding well No fever  Or chills , no chest pains  Objective: Vitals:   04/28/21 0618 04/28/21 0738 04/28/21 1314 04/28/21 1319  BP:      Pulse:      Resp:      Temp:      TempSrc:      SpO2:  100% 100% 100%  Weight: 111.4 kg     Height: 5\' 9"  (1.753 m)       Intake/Output Summary (Last 24 hours) at 04/28/2021 1346 Last data filed at 04/28/2021 1300 Gross per 24 hour  Intake 720 ml  Output 2400 ml  Net -1680 ml   Weight change: -2 kg  Exam:  Physical Exam Gen:- Awake Alert,  in no apparent distress , DOE persist HEENT:- [place].AT, No sclera icterus Nose- [place] 5L/min Neck-Supple Neck,No JVD,.  Lungs-  Fair air movement, scattered bil rhonchi CV- S1, S2 normal, RRR Abd-  +ve B.Sounds, Abd Soft, No tenderness,    Extremity/Skin:- No  edema,  good pulses Psych-affect is appropriate, oriented x3 Neuro-Generalized weakness, no new focal deficits, no tremors     Data Reviewed: I have personally reviewed following labs and imaging studies  Basic Metabolic Panel: Recent Labs  Lab 04/24/21 0453 04/25/21 0424 04/26/21 0420 04/27/21 0439 04/28/21 0613  NA 136 135 135 135 136  K 4.2 4.2 4.3 4.2 3.8  CL 104 103 103 102 102  CO2 20* 22 23 22 24   GLUCOSE 234* 309* 272* 271* 233*  BUN 111* 104* 105* 105* 101*  CREATININE 2.32* 2.35* 2.43* 2.44* 2.46*  CALCIUM 8.1* 8.1* 8.2* 8.4* 8.4*  PHOS 4.3 3.9 3.4 3.5 3.4   Liver Function Tests: Recent Labs  Lab 04/24/21 0453 04/25/21 0424  04/26/21 0420 04/27/21 0439 04/28/21 0613  ALBUMIN 2.6* 2.5* 2.4* 2.5* 2.3*   CBC: No results for input(s): WBC, NEUTROABS, HGB, HCT, MCV, PLT in the last 168 hours.   CBG: Recent Labs  Lab 04/27/21 1139 04/27/21 1632 04/27/21 2217 04/28/21 0748 04/28/21 1136  GLUCAP 320* 290* 256* 201* 151*   Urine analysis:    Component Value Date/Time   COLORURINE YELLOW 04/14/2021 1803   APPEARANCEUR CLEAR 04/14/2021 1803   LABSPEC >1.030 (H) 04/14/2021 1803   PHURINE 5.0 04/14/2021 1803   GLUCOSEU NEGATIVE 04/14/2021 1803   HGBUR NEGATIVE 04/14/2021 1803   BILIRUBINUR NEGATIVE 04/14/2021 1803   KETONESUR NEGATIVE 04/14/2021 1803   PROTEINUR TRACE (A) 04/14/2021 1803   NITRITE NEGATIVE 04/14/2021 1803   LEUKOCYTESUR NEGATIVE 04/14/2021 1803   No results found for this or any previous visit (from the past 240 hour(s)).   Scheduled Meds:  amLODipine  10 mg Oral Daily   aspirin EC  81 mg Oral Daily   budesonide (PULMICORT) nebulizer solution  0.5 mg Nebulization BID   bumetanide  3 mg Oral BID   buPROPion  100 mg Oral BID   carvedilol  25 mg Oral BID WC   cloNIDine  0.3 mg Oral TID   ferrous sulfate  325 mg Oral Q breakfast   heparin  5,000 Units Subcutaneous Q8H   hydrALAZINE  100 mg Oral Q8H   insulin aspart  0-5 Units Subcutaneous QHS   insulin aspart  0-9 Units Subcutaneous TID WC   insulin aspart  7 Units Subcutaneous TID WC   insulin glargine-yfgn  50 Units Subcutaneous QHS   ipratropium-albuterol  3 mL Nebulization Q6H   isosorbide mononitrate  30 mg Oral BID   pantoprazole  40 mg Oral Daily   predniSONE  60 mg Oral Q breakfast   rosuvastatin  20 mg Oral Daily   spironolactone  25 mg Oral BID   Procedures/Studies: CT CHEST WO CONTRAST  Result Date: 04/12/2021 CLINICAL DATA:  60 year old male with history of acute hypoxic respiratory failure requiring BiPAP. EXAM: CT CHEST WITHOUT CONTRAST TECHNIQUE: Multidetector CT imaging of the chest was performed following the  standard protocol without IV contrast. COMPARISON:  No priors. FINDINGS: Cardiovascular: Heart size is normal. There is no significant pericardial fluid, thickening or pericardial calcification. There is aortic atherosclerosis, as well as atherosclerosis of the great vessels of the mediastinum and the coronary arteries, including calcified atherosclerotic plaque in the left anterior descending and left circumflex coronary arteries. Mediastinum/Nodes: Multiple prominent borderline enlarged mediastinal and bilateral hilar lymph nodes are noted, nonspecific and likely reactive. Esophagus is unremarkable in appearance. No axillary lymphadenopathy. Lungs/Pleura: Small bilateral pleural effusions (right greater than left) lying dependently. Patchy multifocal airspace consolidation most evident in a peribronchovascular distribution with surrounding ground-glass attenuation and septal thickening. Upper Abdomen: Unremarkable. Musculoskeletal: There are no aggressive appearing lytic or blastic lesions noted in the visualized portions of the skeleton. Multiple old healed rib fractures. IMPRESSION: 1. Severe multilobar bilateral bronchopneumonia with small bilateral parapneumonic pleural effusions lying dependently, as above. 2. Aortic atherosclerosis, in addition to 2 vessel coronary artery disease. Please note that although the presence of coronary artery calcium documents the presence of coronary artery disease, the severity of this disease and any potential stenosis cannot be assessed on this non-gated CT examination. Assessment for potential risk factor modification, dietary therapy or pharmacologic therapy may be warranted, if clinically indicated. Aortic Atherosclerosis (ICD10-I70.0). Electronically Signed   By: Vinnie Langton M.D.   On: 04/12/2021 18:58   US RENAL  Result Date: 04/14/2021 CLINICAL DATA:  Acute renal injury EXAM: RENAL / URINARY TRACT ULTRASOUND COMPLETE COMPARISON:  None. FINDINGS: Right Kidney:  Renal measurements: 12.3 x 4.5 x 5.7 cm. = volume: 165 mL. Echogenicity within normal limits. No mass or hydronephrosis visualized. Left Kidney: Renal measurements: 10.9 x 6.0 x 4.9 cm. = volume: 66 mL. Echogenicity within normal limits. No mass or hydronephrosis visualized. Bladder: Bladder is well distended. Mild wall thickening is noted anteriorly measuring up to 9 mm. This could be related to underlying UTI. Clinical correlation is recommended. Other: None. IMPRESSION: Normal-appearing kidneys bilaterally. Mild bladder wall thickening which may be inflammatory in nature. Clinical correlation is recommended. Electronically Signed   By: Inez Catalina M.D.   On: 04/14/2021 15:54   DG Chest Port 1 View  Result Date: 04/27/2021 CLINICAL DATA:  Follow-up pulmonary infiltrates EXAM: PORTABLE CHEST 1 VIEW COMPARISON:  04/21/2021 FINDINGS: Cardiac shadow is enlarged but stable. Patchy airspace opacity is identified bilaterally with small left pleural effusion stable in appearance from the prior exam. No bony abnormality is noted. IMPRESSION: No change  from previous exam. Electronically Signed   By: Inez Catalina M.D.   On: 04/27/2021 00:33   DG Chest Port 1 View  Result Date: 04/21/2021 CLINICAL DATA:  Shortness of breath EXAM: PORTABLE CHEST 1 VIEW COMPARISON:  04/21/2021 FINDINGS: Bilateral interstitial and patchy alveolar airspace opacities. Small left pleural effusion. No pneumothorax. Stable cardiomegaly. No acute osseous abnormality. IMPRESSION: 1. Cardiomegaly with bilateral interstitial and alveolar airspace opacities and trace left pleural effusion. Differential considerations include pulmonary edema versus multilobar pneumonia. Electronically Signed   By: Kathreen Devoid M.D.   On: 04/21/2021 19:31   DG CHEST PORT 1 VIEW  Result Date: 04/21/2021 CLINICAL DATA:  Hypoxia EXAM: PORTABLE CHEST 1 VIEW COMPARISON:  Chest x-ray dated April 20, 2021 FINDINGS: Unchanged cardiomegaly. Bilateral heterogeneous  opacities with slightly more focal appearance the right mid lung, likely due to superimposed atelectasis. Small bilateral pleural effusions. No evidence of pneumothorax. IMPRESSION: Similar bilateral heterogeneous pulmonary opacities and small bilateral pleural effusions. Electronically Signed   By: Yetta Glassman M.D.   On: 04/21/2021 08:11   DG Chest Port 1 View  Result Date: 04/20/2021 CLINICAL DATA:  Acute respiratory failure EXAM: PORTABLE CHEST 1 VIEW COMPARISON:  04/14/2021 FINDINGS: Bilateral airspace disease again noted, unchanged. Heart is mildly enlarged. Possible small layering left effusion. Scattered aortic calcifications. No acute bony abnormality. IMPRESSION: Diffuse bilateral airspace disease, unchanged. Suspect layering left effusion. Electronically Signed   By: Rolm Baptise M.D.   On: 04/20/2021 08:02   DG Chest Port 1 View  Result Date: 04/14/2021 CLINICAL DATA:  Shortness of breath, history of stroke, diabetes mellitus, hypertension, MI, cancer EXAM: PORTABLE CHEST 1 VIEW COMPARISON:  Portable exam 1340 hours compared to 04/10/2021 FINDINGS: Enlargement of cardiac silhouette with pulmonary vascular congestion. Stable mediastinal contours. BILATERAL pulmonary infiltrates question pulmonary edema, slightly greater at RIGHT base. Small BILATERAL pleural effusions. No pneumothorax or acute osseous findings. IMPRESSION: Enlargement of cardiac silhouette with pulmonary vascular congestion and mild pulmonary edema. Small BILATERAL pleural effusions. Electronically Signed   By: Lavonia Dana M.D.   On: 04/14/2021 16:01   DG CHEST PORT 1 VIEW  Result Date: 04/10/2021 CLINICAL DATA:  Weakness and fatigue. EXAM: PORTABLE CHEST 1 VIEW COMPARISON:  04/09/2021 FINDINGS: 0657 hours. The cardio pericardial silhouette is enlarged. Focal airspace disease again noted right base with some probable atelectasis or infiltrate in the retrocardiac left base. There is pulmonary vascular congestion without  overt pulmonary edema. Superimposed component of interstitial pulmonary edema suspected. IMPRESSION: Asymmetric focal airspace disease right base with some probable atelectasis or infiltrate in the retrocardiac left base. Interstitial edema not excluded. Electronically Signed   By: Misty Stanley M.D.   On: 04/10/2021 07:33   DG Chest Port 1 View  Result Date: 04/09/2021 CLINICAL DATA:  Fever and weakness EXAM: PORTABLE CHEST 1 VIEW COMPARISON:  None. FINDINGS: The heart and mediastinal contours are within normal limits. Aortic calcification. Right lower lung zone focal consolidation. Question retrocardiac opacity. No pulmonary edema. Possible trace left pleural effusion. No right pleural effusion. No pneumothorax. No acute osseous abnormality. IMPRESSION: Right lower lung zone focal consolidation. Question retrocardiac opacity with possible trace left pleural effusion. Findings consistent with infection/inflammation. Followup PA and lateral chest X-ray is recommended in 3-4 weeks following therapy to ensure resolution and exclude underlying malignancy. Electronically Signed   By: Iven Finn M.D.   On: 04/09/2021 23:59   ECHOCARDIOGRAM COMPLETE  Result Date: 04/10/2021    ECHOCARDIOGRAM REPORT   Patient Name:   Keith Snow  Date of Exam: 04/10/2021 Medical Rec #:  [id number]    Height:       69.0 in Accession #:    [id number]   Weight:       217.0 lb Date of Birth:  23-Jul-1961    BSA:          2.139 m Patient Age:    40 years     BP:           142/83 mmHg Patient Gender: M            HR:           85 bpm. Exam Location:  [facility] Procedure: 2D Echo, Cardiac Doppler and Color Doppler Indications:    SBE I33.9  History:        Patient has no prior history of Echocardiogram examinations.                 Previous Myocardial Infarction, Stroke; Risk                 Factors:Hypertension and Diabetes. Hx of cancer. 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  Sonographer:    Alvino Chapel RCS  Referring Phys: [place]  1. Left ventricular ejection fraction, by estimation, is 55 to 60%. The left ventricle has normal function. The left ventricle has no regional wall motion abnormalities. There is moderate concentric left ventricular hypertrophy. Left ventricular diastolic parameters are consistent with Grade II diastolic dysfunction (pseudonormalization).  2. Right ventricular systolic function is normal. The right ventricular size is normal. There is normal pulmonary artery systolic pressure. The estimated right ventricular systolic pressure is 32.9 mmHg.  3. Left atrial size was moderately dilated.  4. Right atrial size was mildly dilated.  5. The mitral valve is grossly normal. No evidence of mitral valve regurgitation. No evidence of mitral stenosis.  6. The aortic valve is tricuspid. Aortic valve regurgitation is not visualized. No aortic stenosis is present.  7. The inferior vena cava is normal in size with greater than 50% respiratory variability, suggesting right atrial pressure of 3 mmHg. FINDINGS  Left Ventricle: Left ventricular ejection fraction, by estimation, is 55 to 60%. The left ventricle has normal function. The left ventricle has no regional wall motion abnormalities. The left ventricular internal cavity size was normal in size. There is  moderate concentric left ventricular hypertrophy. Left ventricular diastolic parameters are consistent with Grade II diastolic dysfunction (pseudonormalization). Right Ventricle: The right ventricular size is normal. No increase in right ventricular wall thickness. Right ventricular systolic function is normal. There is normal pulmonary artery systolic pressure. The tricuspid regurgitant velocity is 2.10 m/s, and  with an assumed right atrial pressure of 3 mmHg, the estimated right ventricular systolic pressure is 51.8 mmHg. Left Atrium: Left atrial size was moderately dilated. Right Atrium: Right atrial size was mildly  dilated. Pericardium: There is no evidence of pericardial effusion. Mitral Valve: The mitral valve is grossly normal. No evidence of mitral valve regurgitation. No evidence of mitral valve stenosis. Tricuspid Valve: The tricuspid valve is grossly normal. Tricuspid valve regurgitation is trivial. No evidence of tricuspid stenosis. Aortic Valve: The aortic valve is tricuspid. Aortic valve regurgitation is not visualized. No aortic stenosis is present. Pulmonic Valve: The pulmonic valve was grossly normal. Pulmonic valve regurgitation is not visualized. No evidence of pulmonic stenosis. Aorta: The aortic root is normal in  size and structure. Venous: The inferior vena cava is normal in size with greater than 50% respiratory variability, suggesting right atrial pressure of 3 mmHg. IAS/Shunts: The atrial septum is grossly normal.  LEFT VENTRICLE PLAX 2D LVIDd:         4.70 cm  Diastology LVIDs:         3.10 cm  LV e' medial:    6.74 cm/s LV PW:         1.50 cm  LV E/e' medial:  16.5 LV IVS:        1.50 cm  LV e' lateral:   9.14 cm/s LVOT diam:     2.00 cm  LV E/e' lateral: 12.1 LV SV:         65 LV SV Index:   31 LVOT Area:     3.14 cm  RIGHT VENTRICLE RV S prime:     13.80 cm/s TAPSE (M-mode): 2.6 cm LEFT ATRIUM             Index       RIGHT ATRIUM           Index LA diam:        4.20 cm 1.96 cm/m  RA Area:     24.30 cm LA Vol (A2C):   79.4 ml 37.12 ml/m RA Volume:   81.40 ml  38.06 ml/m LA Vol (A4C):   93.6 ml 43.76 ml/m LA Biplane Vol: 86.2 ml 40.30 ml/m  AORTIC VALVE LVOT Vmax:   102.00 cm/s LVOT Vmean:  62.500 cm/s LVOT VTI:    0.208 m  AORTA Ao Root diam: 3.60 cm MITRAL VALVE                TRICUSPID VALVE MV Area (PHT): 4.15 cm     TR Peak grad:   17.6 mmHg MV Decel Time: 183 msec     TR Vmax:        210.00 cm/s MV E velocity: 111.00 cm/s MV A velocity: 79.70 cm/s   SHUNTS MV E/A ratio:  1.39         Systemic VTI:  0.21 m                             Systemic Diam: 2.00 cm Eleonore Chiquito MD Electronically  signed by Eleonore Chiquito MD Signature Date/Time: 04/10/2021/1:25:55 PM    Final    [place] EKG SITE RITE  Result Date: 04/17/2021 If Site Rite image not attached, placement could not be confirmed due to current cardiac rhythm.   Roxan Hockey, MD  [facility]  If 7PM-7AM, please contact night-coverage [email or web address]  Password [email or web address] 04/28/2021, 1:46 PM   LOS: 18 days

## 2021-04-28 NOTE — TOC Progression Note (Addendum)
Transition of Care [facility]) - Progression Note    Patient Details  Name: Yannick Steuber MRN: [id number] Date of Birth: [birth year]  [facility]) CM/SW Contact  Shade Flood, LCSW Phone Number: 04/28/2021, 2:25 PM  Clinical Narrative:     TOC following. Received call from Ayden at the [facility] agency. His phone is [phone]. They can accept pt at dc. Will fax orders as requested to [phone]. Elta Guadeloupe states he will call pt and wife to review further information. DC timeframe not yet known. Will follow up in AM.  Expected Discharge Plan: Home/Self Care Barriers to Discharge: Continued Medical Work up  Expected Discharge Plan and Services Expected Discharge Plan: Home/Self Care       Living arrangements for the past 2 months: Single Family Home                 DME Arranged: Oxygen DME Agency: [employer] Date DME Agency Contacted: 04/19/21 Time DME Agency Contacted: [id number] Representative spoke with at DME Agency: [place] ([place]) Interventions    Readmission Risk Interventions No flowsheet data found.

## 2021-04-28 NOTE — Plan of Care (Signed)

## 2021-04-28 NOTE — Progress Notes (Signed)
   Progress Note  Patient Name: Keith Snow Date of Encounter: 04/28/2021  Primary Cardiologist: Gwen Pounds, MD  Reviewed interval chart since rounds yesterday.  Overall, no major change from a cardiac perspective.  I discussed the case with Dr. Denton Brick who is taking over as hospitalist.  Still working toward reducing oxygen requirement before patient travels back to [place].  Continues with good urine output on oral Bumex, net diuresis of approximately 1900 cc last 24 hours.  Pertinent lab work includes potassium 3.8, creatinine 2.46 which is stable.  Follow-up chest x-ray yesterday showed stable findings with patchy airspace opacities bilaterally and small left pleural effusion.  No change in current cardiac regimen for now.  He is on Norvasc, Coreg, clonidine, hydralazine, Imdur, Aldactone, and Bumex.  Signed, Rozann Lesches, MD  04/28/2021, 11:44 AM

## 2021-04-29 ENCOUNTER — Inpatient Hospital Stay (HOSPITAL_COMMUNITY): Payer: Medicare Other

## 2021-04-29 DIAGNOSIS — I5033 Acute on chronic diastolic (congestive) heart failure: Secondary | ICD-10-CM | POA: Diagnosis not present

## 2021-04-29 LAB — RENAL FUNCTION PANEL
Albumin: 2.4 g/dL — ABNORMAL LOW (ref 3.5–5.0)
Albumin: 2.4 g/dL — ABNORMAL LOW (ref 3.5–5.0)
Anion gap: 10 (ref 5–15)
Anion gap: 9 (ref 5–15)
BUN: 86 mg/dL — ABNORMAL HIGH (ref 6–20)
BUN: 87 mg/dL — ABNORMAL HIGH (ref 6–20)
CO2: 27 mmol/L (ref 22–32)
CO2: 28 mmol/L (ref 22–32)
Calcium: 8.6 mg/dL — ABNORMAL LOW (ref 8.9–10.3)
Calcium: 8.7 mg/dL — ABNORMAL LOW (ref 8.9–10.3)
Chloride: 100 mmol/L (ref 98–111)
Chloride: 99 mmol/L (ref 98–111)
Creatinine, Ser: 2.17 mg/dL — ABNORMAL HIGH (ref 0.61–1.24)
Creatinine, Ser: 2.21 mg/dL — ABNORMAL HIGH (ref 0.61–1.24)
GFR, Estimated: 34 mL/min — ABNORMAL LOW (ref >60)
GFR, Estimated: 34 mL/min — ABNORMAL LOW (ref >60)
Glucose, Bld: 219 mg/dL — ABNORMAL HIGH (ref 70–99)
Glucose, Bld: 221 mg/dL — ABNORMAL HIGH (ref 70–99)
Phosphorus: 3.8 mg/dL (ref 2.5–4.6)
Phosphorus: 3.8 mg/dL (ref 2.5–4.6)
Potassium: 3.9 mmol/L (ref 3.5–5.1)
Potassium: 3.9 mmol/L (ref 3.5–5.1)
Sodium: 136 mmol/L (ref 135–145)
Sodium: 137 mmol/L (ref 135–145)

## 2021-04-29 LAB — CBC
HCT: 32.5 % — ABNORMAL LOW (ref 39.0–52.0)
Hemoglobin: 10 g/dL — ABNORMAL LOW (ref 13.0–17.0)
MCH: 30.5 pg (ref 26.0–34.0)
MCHC: 30.8 g/dL (ref 30.0–36.0)
MCV: 99.1 fL (ref 80.0–100.0)
Platelets: 276 10*3/uL (ref 150–400)
RBC: 3.28 MIL/uL — ABNORMAL LOW (ref 4.22–5.81)
RDW: 17.9 % — ABNORMAL HIGH (ref 11.5–15.5)
WBC: 16.3 10*3/uL — ABNORMAL HIGH (ref 4.0–10.5)
nRBC: 0.7 % — ABNORMAL HIGH (ref 0.0–0.2)

## 2021-04-29 LAB — GLUCOSE, CAPILLARY
Glucose-Capillary: 522 mg/dL (ref 70–99)
Glucose-Capillary: 177 mg/dL — ABNORMAL HIGH (ref 70–99)
Glucose-Capillary: 175 mg/dL — ABNORMAL HIGH (ref 70–99)
Glucose-Capillary: 82 mg/dL (ref 70–99)

## 2021-04-29 MED ORDER — IPRATROPIUM-ALBUTEROL 0.5-2.5 (3) MG/3ML IN SOLN
3.0000 mL | Freq: Four times a day (QID) | RESPIRATORY_TRACT | Status: DC
  Administered 2021-04-29 – 2021-04-30 (×4): 3 mL via RESPIRATORY_TRACT
  Filled 2021-04-29 (×4): qty 3

## 2021-04-29 MED ORDER — INSULIN ASPART 100 UNIT/ML IJ SOLN
10.0000 [IU] | Freq: Three times a day (TID) | INTRAMUSCULAR | Status: DC
  Administered 2021-04-29 – 2021-05-01 (×6): 10 [IU] via SUBCUTANEOUS

## 2021-04-29 MED ORDER — INSULIN GLARGINE-YFGN 100 UNIT/ML ~~LOC~~ SOLN
52.0000 [IU] | Freq: Every day | SUBCUTANEOUS | Status: DC
  Administered 2021-04-29 – 2021-04-30 (×2): 52 [IU] via SUBCUTANEOUS
  Filled 2021-04-29 (×4): qty 0.52

## 2021-04-29 NOTE — Progress Notes (Signed)
PROGRESS NOTE  Keith Snow QMG:[id number] DOB: 1961/05/31 DOA: 04/09/2021 PCP: System, Provider Not In   Brief History:  60 year old with history of DM2, HTN, MI, CHF comes to the ED with change in mental status.  Upon admission he was noted to have focal consolidation in the right lower lung, elevated BNP.  He was also hyperglycemic.  CT of the chest showed combination of severe bilateral bronchopneumonia and possible effusion.  It has been difficult to diurese him due to worsening renal function.  Nephrology and Pulm consulted for their input. Started on Broad Spectrum Abx and Steroids. Cr continues to increase slowly.    Assessment/Plan: Acute hypoxic respiratory failure requiring BiPAP and [place] oxygen- Multifactorial.  Combination of PNA, COPD and CHF exacerbation. -04/12/21 --CT chest Severe b/l BronchoPNA with small b/l parapneumonic effusions.  -04/20/21---CXR personally reviewed-unchanged diffuse bilateral pulm infiltrates -Still requiring  nasal cannula supplementation at 5L/min with intermittent use of BiPAP.  --Appreciate assistance by pulmonology service, nephrology and cardiology service. -Patient continue to improve slowly- -Physical therapy recommending home health services. -Continue weaning off oxygen supplementation as tolerated. --DOE persist, Hypoxia persist (5L/min), desaturates quickly   Community-acquired pneumonia. Aspiration PNA?  Concerns for BOOP - Pro-Cal 0.54>>0.34  -Cleared by speech and swallow for regular diet. - Bronchodilators scheduled and as needed.  I-S/flutter -Speech and swallow eval-regular diet -MRSA neg -04/19/21-Pulmonology- Dr. Alfonzo Beers to po steroids, and assess response. -patient has finished 7 days zosyn; remained afebrile. -c/n long/slow steroids. -Very slow improvement from a pulmonary and hypoxia standpoint  Diabetes mellitus type 2, uncontrolled with hyperglycemia - 04/10/21 A1c-10.3 - Continue current dose of long-acting  insulin and continue sliding scale -Anticipate erratic sugars with steroid therapy  Acute on chronic congestive heart failure with preserved EF, grade 2 DD, class III -04/10/21 Echo 55-60%, grade 2 DD, moderate LVH - Lasix IV by nephrology>>transition to 1/2 home dose bumex -9/26 discussed with renal--Dr. Joelyn Oms avoid ACEI/ARB/ARNI/SGLT2, Delene Loll due to renal concerns -PTA patient was on Entresto, - held due to AKI.  -Continue Aldactone -Continue to follow I's and O's -Continue low-sodium diet. -Continues to diurese well   Acute kidney injury on CKD stage IIIa - Admission creatinine 1.8, 2.63 > 2.71 > 3.17 >3.49 >3.84>>3.04>>>2.32 >2.21 ---Currently producing about 1.5 L of urine over last 24 hours.   -Creatinine and BUN improving -Renal ultrasound is negative for acute pathology.   previously on Bumex 2 mg bid when he lived in [place]   Acute metabolic encephalopathy -Resolved.  -Mentation back to baseline. -Patient is oriented x3 and following commands appropriately.   Essential hypertension - Continue the use of Coreg,  hydralazine, clonidine, imdur and amlodipine. -nifedipine stopped due to concerns for shunt -Continue to follow vital signs and further adjust antihypertensive treatment as needed -Following nephrology service recommendation and spironolactone has been added.  Hyperlipidemia - Continue the use of Crestor   Status is: Inpatient   Remains inpatient appropriate because:Inpatient level of care appropriate due to severity of illness   Dispo: The patient is from: Home              Anticipated d/c is to: Home with home O2              Patient currently is not medically stable to d/c.  Making progress and hopefully will fully stabilized for home discharge in the next 2-3 days-hypoxia and dyspnea on exertion persist--- patient desaturates very quickly  Difficult to place patient No          Family Communication:   Wife updated    Consultants:  pulm    Code Status:  FULL    DVT Prophylaxis:  [place] Heparin     Procedures: As Listed in Progress Note Above   Antibiotics: Zosyn 9/20>>9/27   Subjective: hypoxia and dyspnea on exertion persist--- patient desaturates very quickly - Voiding well, no chest pain  Objective: Vitals:   04/29/21 0920 04/29/21 0921 04/29/21 1400 04/29/21 1748  BP:   (!) 155/78 136/74  Pulse:   68 69  Resp:   19   Temp:   98.3 F (36.8 C)   TempSrc:   Oral   SpO2: (!) 83% 97% 98%   Weight:      Height:        Intake/Output Summary (Last 24 hours) at 04/29/2021 1755 Last data filed at 04/29/2021 1700 Gross per 24 hour  Intake 860 ml  Output 4700 ml  Net -3840 ml   Weight change: -10.8 kg  Exam:  Physical Exam Gen:- Awake Alert,  in no apparent distress , DOE persist HEENT:- [place].AT, No sclera icterus Nose- [place] 5L/min Neck-Supple Neck,No JVD,.  Lungs-  Fair air movement, scattered bil rhonchi CV- S1, S2 normal, RRR Abd-  +ve B.Sounds, Abd Soft, No tenderness,    Extremity/Skin:- No  edema,  good pulses Psych-affect is appropriate, oriented x3 Neuro-Generalized weakness, no new focal deficits, no tremors   Data Reviewed: I have personally reviewed following labs and imaging studies  Basic Metabolic Panel: Recent Labs  Lab 04/25/21 0424 04/26/21 0420 04/27/21 0439 04/28/21 0613 04/28/21 1815 04/29/21 0555  NA 135 135 135 136  --  136  137  K 4.2 4.3 4.2 3.8  --  3.9  3.9  CL 103 103 102 102  --  99  100  CO2 22 23 22 24   --  27  28  GLUCOSE 309* 272* 271* 233* 370* 219*  221*  BUN 104* 105* 105* 101*  --  87*  86*  CREATININE 2.35* 2.43* 2.44* 2.46*  --  2.17*  2.21*  CALCIUM 8.1* 8.2* 8.4* 8.4*  --  8.6*  8.7*  PHOS 3.9 3.4 3.5 3.4  --  3.8  3.8   Liver Function Tests: Recent Labs  Lab 04/25/21 0424 04/26/21 0420 04/27/21 0439 04/28/21 0613 04/29/21 0555  ALBUMIN 2.5* 2.4* 2.5* 2.3* 2.4*  2.4*   CBC: Recent Labs  Lab 04/29/21 0555  WBC 16.3*  HGB 10.0*   HCT 32.5*  MCV 99.1  PLT 276    CBG: Recent Labs  Lab 04/28/21 1734 04/28/21 2119 04/29/21 0754 04/29/21 1137 04/29/21 1653  GLUCAP 522* 330* 177* 175* 82   Urine analysis:    Component Value Date/Time   COLORURINE YELLOW 04/14/2021 1803   APPEARANCEUR CLEAR 04/14/2021 1803   LABSPEC >1.030 (H) 04/14/2021 1803   PHURINE 5.0 04/14/2021 1803   GLUCOSEU NEGATIVE 04/14/2021 1803   HGBUR NEGATIVE 04/14/2021 1803   BILIRUBINUR NEGATIVE 04/14/2021 1803   KETONESUR NEGATIVE 04/14/2021 1803   PROTEINUR TRACE (A) 04/14/2021 1803   NITRITE NEGATIVE 04/14/2021 1803   LEUKOCYTESUR NEGATIVE 04/14/2021 1803   No results found for this or any previous visit (from the past 240 hour(s)).   Scheduled Meds:  amLODipine  10 mg Oral Daily   aspirin EC  81 mg Oral Daily   budesonide (PULMICORT) nebulizer solution  0.5 mg Nebulization BID   bumetanide  3  mg Oral BID   buPROPion  100 mg Oral BID   carvedilol  25 mg Oral BID WC   cloNIDine  0.3 mg Oral TID   ferrous sulfate  325 mg Oral Q breakfast   heparin  5,000 Units Subcutaneous Q8H   hydrALAZINE  100 mg Oral Q8H   insulin aspart  0-5 Units Subcutaneous QHS   insulin aspart  0-9 Units Subcutaneous TID WC   insulin aspart  10 Units Subcutaneous TID WC   insulin glargine-yfgn  52 Units Subcutaneous QHS   ipratropium-albuterol  3 mL Nebulization Q6H WA   isosorbide mononitrate  30 mg Oral BID   pantoprazole  40 mg Oral Daily   predniSONE  60 mg Oral Q breakfast   rosuvastatin  20 mg Oral Daily   spironolactone  25 mg Oral BID   Procedures/Studies: DG Chest 2 View  Result Date: 04/29/2021 CLINICAL DATA:  Shortness of breath. EXAM: CHEST - 2 VIEW COMPARISON:  April 27, 2021. FINDINGS: Stable cardiomediastinal silhouette. Stable bilateral lung opacities are noted, right greater than left. Small left pleural effusion is noted. Bony thorax is unremarkable. IMPRESSION: Stable bilateral lung opacities as described above, right greater  than left. Electronically Signed   By: Marijo Conception M.D.   On: 04/29/2021 08:55   CT CHEST WO CONTRAST  Result Date: 04/12/2021 CLINICAL DATA:  60 year old male with history of acute hypoxic respiratory failure requiring BiPAP. EXAM: CT CHEST WITHOUT CONTRAST TECHNIQUE: Multidetector CT imaging of the chest was performed following the standard protocol without IV contrast. COMPARISON:  No priors. FINDINGS: Cardiovascular: Heart size is normal. There is no significant pericardial fluid, thickening or pericardial calcification. There is aortic atherosclerosis, as well as atherosclerosis of the great vessels of the mediastinum and the coronary arteries, including calcified atherosclerotic plaque in the left anterior descending and left circumflex coronary arteries. Mediastinum/Nodes: Multiple prominent borderline enlarged mediastinal and bilateral hilar lymph nodes are noted, nonspecific and likely reactive. Esophagus is unremarkable in appearance. No axillary lymphadenopathy. Lungs/Pleura: Small bilateral pleural effusions (right greater than left) lying dependently. Patchy multifocal airspace consolidation most evident in a peribronchovascular distribution with surrounding ground-glass attenuation and septal thickening. Upper Abdomen: Unremarkable. Musculoskeletal: There are no aggressive appearing lytic or blastic lesions noted in the visualized portions of the skeleton. Multiple old healed rib fractures. IMPRESSION: 1. Severe multilobar bilateral bronchopneumonia with small bilateral parapneumonic pleural effusions lying dependently, as above. 2. Aortic atherosclerosis, in addition to 2 vessel coronary artery disease. Please note that although the presence of coronary artery calcium documents the presence of coronary artery disease, the severity of this disease and any potential stenosis cannot be assessed on this non-gated CT examination. Assessment for potential risk factor modification, dietary therapy or  pharmacologic therapy may be warranted, if clinically indicated. Aortic Atherosclerosis (ICD10-I70.0). Electronically Signed   By: Vinnie Langton M.D.   On: 04/12/2021 18:58   US RENAL  Result Date: 04/14/2021 CLINICAL DATA:  Acute renal injury EXAM: RENAL / URINARY TRACT ULTRASOUND COMPLETE COMPARISON:  None. FINDINGS: Right Kidney: Renal measurements: 12.3 x 4.5 x 5.7 cm. = volume: 165 mL. Echogenicity within normal limits. No mass or hydronephrosis visualized. Left Kidney: Renal measurements: 10.9 x 6.0 x 4.9 cm. = volume: 66 mL. Echogenicity within normal limits. No mass or hydronephrosis visualized. Bladder: Bladder is well distended. Mild wall thickening is noted anteriorly measuring up to 9 mm. This could be related to underlying UTI. Clinical correlation is recommended. Other: None. IMPRESSION: Normal-appearing kidneys bilaterally. Mild  bladder wall thickening which may be inflammatory in nature. Clinical correlation is recommended. Electronically Signed   By: Inez Catalina M.D.   On: 04/14/2021 15:54   DG Chest Port 1 View  Result Date: 04/27/2021 CLINICAL DATA:  Follow-up pulmonary infiltrates EXAM: PORTABLE CHEST 1 VIEW COMPARISON:  04/21/2021 FINDINGS: Cardiac shadow is enlarged but stable. Patchy airspace opacity is identified bilaterally with small left pleural effusion stable in appearance from the prior exam. No bony abnormality is noted. IMPRESSION: No change from previous exam. Electronically Signed   By: Inez Catalina M.D.   On: 04/27/2021 00:33   DG Chest Port 1 View  Result Date: 04/21/2021 CLINICAL DATA:  Shortness of breath EXAM: PORTABLE CHEST 1 VIEW COMPARISON:  04/21/2021 FINDINGS: Bilateral interstitial and patchy alveolar airspace opacities. Small left pleural effusion. No pneumothorax. Stable cardiomegaly. No acute osseous abnormality. IMPRESSION: 1. Cardiomegaly with bilateral interstitial and alveolar airspace opacities and trace left pleural effusion. Differential  considerations include pulmonary edema versus multilobar pneumonia. Electronically Signed   By: Kathreen Devoid M.D.   On: 04/21/2021 19:31   DG CHEST PORT 1 VIEW  Result Date: 04/21/2021 CLINICAL DATA:  Hypoxia EXAM: PORTABLE CHEST 1 VIEW COMPARISON:  Chest x-ray dated April 20, 2021 FINDINGS: Unchanged cardiomegaly. Bilateral heterogeneous opacities with slightly more focal appearance the right mid lung, likely due to superimposed atelectasis. Small bilateral pleural effusions. No evidence of pneumothorax. IMPRESSION: Similar bilateral heterogeneous pulmonary opacities and small bilateral pleural effusions. Electronically Signed   By: Yetta Glassman M.D.   On: 04/21/2021 08:11   DG Chest Port 1 View  Result Date: 04/20/2021 CLINICAL DATA:  Acute respiratory failure EXAM: PORTABLE CHEST 1 VIEW COMPARISON:  04/14/2021 FINDINGS: Bilateral airspace disease again noted, unchanged. Heart is mildly enlarged. Possible small layering left effusion. Scattered aortic calcifications. No acute bony abnormality. IMPRESSION: Diffuse bilateral airspace disease, unchanged. Suspect layering left effusion. Electronically Signed   By: Rolm Baptise M.D.   On: 04/20/2021 08:02   DG Chest Port 1 View  Result Date: 04/14/2021 CLINICAL DATA:  Shortness of breath, history of stroke, diabetes mellitus, hypertension, MI, cancer EXAM: PORTABLE CHEST 1 VIEW COMPARISON:  Portable exam 1340 hours compared to 04/10/2021 FINDINGS: Enlargement of cardiac silhouette with pulmonary vascular congestion. Stable mediastinal contours. BILATERAL pulmonary infiltrates question pulmonary edema, slightly greater at RIGHT base. Small BILATERAL pleural effusions. No pneumothorax or acute osseous findings. IMPRESSION: Enlargement of cardiac silhouette with pulmonary vascular congestion and mild pulmonary edema. Small BILATERAL pleural effusions. Electronically Signed   By: Lavonia Dana M.D.   On: 04/14/2021 16:01   DG CHEST PORT 1 VIEW  Result  Date: 04/10/2021 CLINICAL DATA:  Weakness and fatigue. EXAM: PORTABLE CHEST 1 VIEW COMPARISON:  04/09/2021 FINDINGS: 0657 hours. The cardio pericardial silhouette is enlarged. Focal airspace disease again noted right base with some probable atelectasis or infiltrate in the retrocardiac left base. There is pulmonary vascular congestion without overt pulmonary edema. Superimposed component of interstitial pulmonary edema suspected. IMPRESSION: Asymmetric focal airspace disease right base with some probable atelectasis or infiltrate in the retrocardiac left base. Interstitial edema not excluded. Electronically Signed   By: Misty Stanley M.D.   On: 04/10/2021 07:33   DG Chest Port 1 View  Result Date: 04/09/2021 CLINICAL DATA:  Fever and weakness EXAM: PORTABLE CHEST 1 VIEW COMPARISON:  None. FINDINGS: The heart and mediastinal contours are within normal limits. Aortic calcification. Right lower lung zone focal consolidation. Question retrocardiac opacity. No pulmonary edema. Possible trace left pleural effusion.  No right pleural effusion. No pneumothorax. No acute osseous abnormality. IMPRESSION: Right lower lung zone focal consolidation. Question retrocardiac opacity with possible trace left pleural effusion. Findings consistent with infection/inflammation. Followup PA and lateral chest X-ray is recommended in 3-4 weeks following therapy to ensure resolution and exclude underlying malignancy. Electronically Signed   By: Iven Finn M.D.   On: 04/09/2021 23:59   ECHOCARDIOGRAM COMPLETE  Result Date: 04/10/2021    ECHOCARDIOGRAM REPORT   Patient Name:   JOHNATTAN Lagoy Date of Exam: 04/10/2021 Medical Rec #:  [id number]    Height:       69.0 in Accession #:    [id number]   Weight:       217.0 lb Date of Birth:  07/22/1961    BSA:          2.139 m Patient Age:    74 years     BP:           142/83 mmHg Patient Gender: M            HR:           85 bpm. Exam Location:  [facility] Procedure: 2D Echo, Cardiac Doppler  and Color Doppler Indications:    SBE I33.9  History:        Patient has no prior history of Echocardiogram examinations.                 Previous Myocardial Infarction, Stroke; Risk                 Factors:Hypertension and Diabetes. Hx of cancer. 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  Sonographer:    Alvino Chapel RCS Referring Phys: [place]  1. Left ventricular ejection fraction, by estimation, is 55 to 60%. The left ventricle has normal function. The left ventricle has no regional wall motion abnormalities. There is moderate concentric left ventricular hypertrophy. Left ventricular diastolic parameters are consistent with Grade II diastolic dysfunction (pseudonormalization).  2. Right ventricular systolic function is normal. The right ventricular size is normal. There is normal pulmonary artery systolic pressure. The estimated right ventricular systolic pressure is 62.3 mmHg.  3. Left atrial size was moderately dilated.  4. Right atrial size was mildly dilated.  5. The mitral valve is grossly normal. No evidence of mitral valve regurgitation. No evidence of mitral stenosis.  6. The aortic valve is tricuspid. Aortic valve regurgitation is not visualized. No aortic stenosis is present.  7. The inferior vena cava is normal in size with greater than 50% respiratory variability, suggesting right atrial pressure of 3 mmHg. FINDINGS  Left Ventricle: Left ventricular ejection fraction, by estimation, is 55 to 60%. The left ventricle has normal function. The left ventricle has no regional wall motion abnormalities. The left ventricular internal cavity size was normal in size. There is  moderate concentric left ventricular hypertrophy. Left ventricular diastolic parameters are consistent with Grade II diastolic dysfunction (pseudonormalization). Right Ventricle: The right ventricular size is normal. No increase in right ventricular wall thickness. Right ventricular  systolic function is normal. There is normal pulmonary artery systolic pressure. The tricuspid regurgitant velocity is 2.10 m/s, and  with an assumed right atrial pressure of 3 mmHg, the estimated right ventricular systolic pressure is 76.2 mmHg. Left Atrium: Left atrial size was moderately dilated. Right Atrium: Right atrial size was mildly dilated. Pericardium: There is no evidence  of pericardial effusion. Mitral Valve: The mitral valve is grossly normal. No evidence of mitral valve regurgitation. No evidence of mitral valve stenosis. Tricuspid Valve: The tricuspid valve is grossly normal. Tricuspid valve regurgitation is trivial. No evidence of tricuspid stenosis. Aortic Valve: The aortic valve is tricuspid. Aortic valve regurgitation is not visualized. No aortic stenosis is present. Pulmonic Valve: The pulmonic valve was grossly normal. Pulmonic valve regurgitation is not visualized. No evidence of pulmonic stenosis. Aorta: The aortic root is normal in size and structure. Venous: The inferior vena cava is normal in size with greater than 50% respiratory variability, suggesting right atrial pressure of 3 mmHg. IAS/Shunts: The atrial septum is grossly normal.  LEFT VENTRICLE PLAX 2D LVIDd:         4.70 cm  Diastology LVIDs:         3.10 cm  LV e' medial:    6.74 cm/s LV PW:         1.50 cm  LV E/e' medial:  16.5 LV IVS:        1.50 cm  LV e' lateral:   9.14 cm/s LVOT diam:     2.00 cm  LV E/e' lateral: 12.1 LV SV:         65 LV SV Index:   31 LVOT Area:     3.14 cm  RIGHT VENTRICLE RV S prime:     13.80 cm/s TAPSE (M-mode): 2.6 cm LEFT ATRIUM             Index       RIGHT ATRIUM           Index LA diam:        4.20 cm 1.96 cm/m  RA Area:     24.30 cm LA Vol (A2C):   79.4 ml 37.12 ml/m RA Volume:   81.40 ml  38.06 ml/m LA Vol (A4C):   93.6 ml 43.76 ml/m LA Biplane Vol: 86.2 ml 40.30 ml/m  AORTIC VALVE LVOT Vmax:   102.00 cm/s LVOT Vmean:  62.500 cm/s LVOT VTI:    0.208 m  AORTA Ao Root diam: 3.60 cm MITRAL  VALVE                TRICUSPID VALVE MV Area (PHT): 4.15 cm     TR Peak grad:   17.6 mmHg MV Decel Time: 183 msec     TR Vmax:        210.00 cm/s MV E velocity: 111.00 cm/s MV A velocity: 79.70 cm/s   SHUNTS MV E/A ratio:  1.39         Systemic VTI:  0.21 m                             Systemic Diam: 2.00 cm Eleonore Chiquito MD Electronically signed by Eleonore Chiquito MD Signature Date/Time: 04/10/2021/1:25:55 PM    Final    [place] EKG SITE RITE  Result Date: 04/17/2021 If Site Rite image not attached, placement could not be confirmed due to current cardiac rhythm.   Roxan Hockey, MD  [facility]  If 7PM-7AM, please contact night-coverage [email or web address]  Password [email or web address] 04/29/2021, 5:55 PM   LOS: 19 days

## 2021-04-29 NOTE — Progress Notes (Signed)
Talked with RN about not being called to place patient on Bipap.  RN informed me that patient did not go to bed till 4 am this morning.  RN stated patient had been taking small naps in recliner off and on all morning and night.

## 2021-04-29 NOTE — Progress Notes (Signed)
Went down to put patient on Bipap.  Patient was still in recliner and was sleeping peacefully with [place] at 5L.  Called patient's name and he did not respond.  Spoke to NT and told her to let RN know to call me when patient was ready to be placed on Bipap.
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
## 2021-04-29 NOTE — TOC Progression Note (Signed)
Transition of Care [facility]) - Progression Note    Patient Details  Name: Keith Snow MRN: [id number] Date of Birth: March 06, 1961  Transition of Care [facility]) CM/SW Contact  Shade Flood, LCSW Phone Number: 04/29/2021, 4:00 PM  Clinical Narrative:     TOC following for dc planning. Pt still not medically ready for dc per MD. Damaris Schooner with Caryl Pina at [facility] for update on pt's home O2 needs. Caryl Pina confirmed that they have a branch that serves the area where pt lives in [place]. Caryl Pina spoke with pt's wife to initiate discussion about Home O2 setup. Caryl Pina states that if pt is on 4L at dc, he will need 5 portable tanks from [employer] at the time of dc.   Will follow up in AM to further assist with dc planning.  Expected Discharge Plan: Home/Self Care Barriers to Discharge: Continued Medical Work up  Expected Discharge Plan and Services Expected Discharge Plan: Home/Self Care       Living arrangements for the past 2 months: Single Family Home                 DME Arranged: Oxygen DME Agency: [employer] Date DME Agency Contacted: 04/19/21 Time DME Agency Contacted: 1108 Representative spoke with at DME Agency: [place] ([place]) Interventions    Readmission Risk Interventions No flowsheet data found.

## 2021-04-29 NOTE — Progress Notes (Addendum)
Progress Note  Patient Name: Keith Snow Date of Encounter: 04/29/2021  Primary Cardiologist: Gwen Pounds, MD  Subjective   No chest pain or cough.  Did not use BiPAP last night.  Still on 5 L nasal cannula.  Inpatient Medications    Scheduled Meds:  amLODipine  10 mg Oral Daily   aspirin EC  81 mg Oral Daily   budesonide (PULMICORT) nebulizer solution  0.5 mg Nebulization BID   bumetanide  3 mg Oral BID   buPROPion  100 mg Oral BID   carvedilol  25 mg Oral BID WC   cloNIDine  0.3 mg Oral TID   ferrous sulfate  325 mg Oral Q breakfast   heparin  5,000 Units Subcutaneous Q8H   hydrALAZINE  100 mg Oral Q8H   insulin aspart  0-5 Units Subcutaneous QHS   insulin aspart  0-9 Units Subcutaneous TID WC   insulin aspart  10 Units Subcutaneous TID WC   insulin glargine-yfgn  52 Units Subcutaneous QHS   ipratropium-albuterol  3 mL Nebulization Q6H   isosorbide mononitrate  30 mg Oral BID   pantoprazole  40 mg Oral Daily   predniSONE  60 mg Oral Q breakfast   rosuvastatin  20 mg Oral Daily   spironolactone  25 mg Oral BID     PRN Meds: acetaminophen **OR** acetaminophen, ipratropium-albuterol, labetalol, ondansetron **OR** ondansetron (ZOFRAN) IV, oxyCODONE, senna-docusate, traZODone   Vital Signs    Vitals:   04/29/21 0850 04/29/21 0901 04/29/21 0920 04/29/21 0921  BP:      Pulse:      Resp:      Temp:      TempSrc:      SpO2: 97% 92% (!) 83% 97%  Weight:  98.6 kg    Height:        Intake/Output Summary (Last 24 hours) at 04/29/2021 1020 Last data filed at 04/29/2021 0515 Gross per 24 hour  Intake 540 ml  Output 3650 ml  Net -3110 ml   Filed Weights   04/28/21 0618 04/29/21 0522 04/29/21 0901  Weight: 111.4 kg 100.6 kg 98.6 kg    Telemetry    Sinus rhythm.  Personally reviewed.  ECG    No new ECG reviewed today.  Physical Exam   GEN: No acute distress.   Neck: No JVD. Cardiac: RRR without gallop.  Physiologically split S2. Respiratory:  Nonlabored.  Decreased breath sounds at the left base.  No wheezing or crackles.  No egophony. GI: Soft, nontender, bowel sounds present. MS: No pitting edema; No deformity.  Labs    Chemistry Recent Labs  Lab 04/27/21 0439 04/28/21 0613 04/28/21 1815 04/29/21 0555  NA 135 136  --  136  137  K 4.2 3.8  --  3.9  3.9  CL 102 102  --  99  100  CO2 22 24  --  27  28  GLUCOSE 271* 233* 370* 219*  221*  BUN 105* 101*  --  87*  86*  CREATININE 2.44* 2.46*  --  2.17*  2.21*  CALCIUM 8.4* 8.4*  --  8.6*  8.7*  ALBUMIN 2.5* 2.3*  --  2.4*  2.4*  GFRNONAA 30* 30*  --  34*  34*  ANIONGAP 11 10  --  10  9     Hematology Recent Labs  Lab 04/29/21 0555  WBC 16.3*  RBC 3.28*  HGB 10.0*  HCT 32.5*  MCV 99.1  MCH 30.5  MCHC 30.8  RDW 17.9*  PLT 276    Cardiac Enzymes Recent Labs  Lab 04/10/21 0009 04/10/21 0215  TROPONINIHS 91* 87*    Radiology    DG Chest 2 View  Result Date: 04/29/2021 CLINICAL DATA:  Shortness of breath. EXAM: CHEST - 2 VIEW COMPARISON:  April 27, 2021. FINDINGS: Stable cardiomediastinal silhouette. Stable bilateral lung opacities are noted, right greater than left. Small left pleural effusion is noted. Bony thorax is unremarkable. IMPRESSION: Stable bilateral lung opacities as described above, right greater than left. Electronically Signed   By: Marijo Conception M.D.   On: 04/29/2021 08:55    Cardiac Studies   Echocardiogram 04/10/2021:  1. Left ventricular ejection fraction, by estimation, is 55 to 60%. The  left ventricle has normal function. The left ventricle has no regional  wall motion abnormalities. There is moderate concentric left ventricular  hypertrophy. Left ventricular  diastolic parameters are consistent with Grade II diastolic dysfunction  (pseudonormalization).   2. Right ventricular systolic function is normal. The right ventricular  size is normal. There is normal pulmonary artery systolic pressure. The  estimated right  ventricular systolic pressure is 53.6 mmHg.   3. Left atrial size was moderately dilated.   4. Right atrial size was mildly dilated.   5. The mitral valve is grossly normal. No evidence of mitral valve  regurgitation. No evidence of mitral stenosis.   6. The aortic valve is tricuspid. Aortic valve regurgitation is not  visualized. No aortic stenosis is present.   7. The inferior vena cava is normal in size with greater than 50%  respiratory variability, suggesting right atrial pressure of 3 mmHg.   Assessment & Plan    1.  Acute on chronic diastolic heart failure, LVEF 55 to 60% with moderate LVH, moderate diastolic dysfunction, and normal RV contraction as well as estimated RVSP.  Outside records from his primary cardiologist indicate negative PYP scan for TTR amyloidosis.  Net urine output 3400 cc last 24 hours.  Follow-up chest x-ray shows stable bilateral opacities, right greater than left with a small left pleural effusion.  2.  Acute hypoxic respiratory failure with continued oxygen requirement.  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  3.  AKI on CKD stage IIIb.  Baseline creatinine 1.7-2.1, creatinine has come down to 2.17 from 2.46.  4.  Uncontrolled type 2 diabetes mellitus.  Hemoglobin A1c 10.3%.  Presently on insulin, had been on Jardiance as an outpatient per records but not good candidate for SGLT2 inhibitor at this time due to degree of renal insufficiency.  5.  Essential hypertension, on multimodal therapy.  6.  Mixed hyperlipidemia, on Crestor.  Continue Coreg, Norvasc, clonidine, hydralazine, Imdur, Aldactone and Bumex.  He is not a candidate for ARB, ANRI, or SGLT2 inhibitor at this time, but if creatinine improves closer to baseline he may be able to resume Entresto with simplification of current regimen.  Continue current diuretic plan, although suspect Bumex will need to be cut back  eventually.  Reassuring that his renal function is improving however.  Question whether he needs a follow-up chest CT, will defer to primary team and Pulmonary.  Signed, Rozann Lesches, MD  04/29/2021, 10:20 AM

## 2021-04-30 DIAGNOSIS — I5033 Acute on chronic diastolic (congestive) heart failure: Secondary | ICD-10-CM | POA: Diagnosis not present

## 2021-04-30 LAB — GLUCOSE, CAPILLARY
Glucose-Capillary: 177 mg/dL — ABNORMAL HIGH (ref 70–99)
Glucose-Capillary: 225 mg/dL — ABNORMAL HIGH (ref 70–99)
Glucose-Capillary: 225 mg/dL — ABNORMAL HIGH (ref 70–99)
Glucose-Capillary: 231 mg/dL — ABNORMAL HIGH (ref 70–99)
Glucose-Capillary: 154 mg/dL — ABNORMAL HIGH (ref 70–99)

## 2021-04-30 LAB — RENAL FUNCTION PANEL
Albumin: 2.4 g/dL — ABNORMAL LOW (ref 3.5–5.0)
Anion gap: 10 (ref 5–15)
BUN: 82 mg/dL — ABNORMAL HIGH (ref 6–20)
CO2: 28 mmol/L (ref 22–32)
Calcium: 8.5 mg/dL — ABNORMAL LOW (ref 8.9–10.3)
Chloride: 99 mmol/L (ref 98–111)
Creatinine, Ser: 2.1 mg/dL — ABNORMAL HIGH (ref 0.61–1.24)
GFR, Estimated: 36 mL/min — ABNORMAL LOW (ref >60)
Glucose, Bld: 219 mg/dL — ABNORMAL HIGH (ref 70–99)
Phosphorus: 4.2 mg/dL (ref 2.5–4.6)
Potassium: 4.3 mmol/L (ref 3.5–5.1)
Sodium: 137 mmol/L (ref 135–145)

## 2021-04-30 MED ORDER — IPRATROPIUM-ALBUTEROL 0.5-2.5 (3) MG/3ML IN SOLN
3.0000 mL | Freq: Three times a day (TID) | RESPIRATORY_TRACT | Status: DC
  Administered 2021-05-01 (×2): 3 mL via RESPIRATORY_TRACT
  Filled 2021-04-30 (×2): qty 3

## 2021-04-30 MED ORDER — BUMETANIDE 1 MG PO TABS
2.0000 mg | ORAL_TABLET | Freq: Two times a day (BID) | ORAL | Status: DC
  Administered 2021-04-30 – 2021-05-01 (×2): 2 mg via ORAL
  Filled 2021-04-30 (×2): qty 2
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
## 2021-04-30 NOTE — TOC Progression Note (Addendum)
Transition of Care [facility]) - Progression Note    Patient Details  Name: Keith Snow MRN: [id number] Date of Birth: 1960/10/06  Transition of Care [facility]) CM/SW Contact  Shade Flood, LCSW Phone Number: 04/30/2021, 1:46 PM  Clinical Narrative:     TOC following. MD anticipating pt will be stable for dc tomorrow. Pt requiring 3L O2 at rest and 5L with activity. Have discussed with Caryl Pina from [employer] and plan is for portable concentrator for use in the vehicle and one etank for use when ambulating outside of the vehicle. Requested that the equipment be delivered to pt's hospital room today so pt and his wife can get on the road tomorrow without delay.  Faxed updated [place] orders to [facility]. Will ask weekend TOC to send the DC summary to [facility] when it is complete.  Updated pt's wife on above. She is in agreement with dc plan.  Weekend TOC will follow.  1423: Received update from [place] at [facility] that they can only provide the e tanks to pt for dc. Since portable concentrator only goes up to 3L and there are times pt requires 5L, they cannot provide a device that does not accommodate up to 5L. E tanks will be delivered to the hospital today. [place] from [employer] to contact pt's wife to update.  Expected Discharge Plan: Home/Self Care Barriers to Discharge: Continued Medical Work up  Expected Discharge Plan and Services Expected Discharge Plan: Home/Self Care In-house Referral: Clinical Social Work   Post Acute Care Choice: Durable Medical Equipment, Home Health Living arrangements for the past 2 months: Single Family Home                 DME Arranged: Oxygen DME Agency: [employer] Date DME Agency Contacted: 04/19/21 Time DME Agency Contacted: 1108 Representative spoke with at DME Agency: Caryl Pina HH Arranged: RN, PT, Nurse's Aide [place] Agency: Other - See comment Advertising account planner [place] ([place])) Date HH Agency Contacted: 04/27/21   Representative spoke with at [place] ([place]) Interventions    Readmission Risk Interventions No flowsheet data found.

## 2021-04-30 NOTE — Progress Notes (Signed)
Progress Note  Patient Name: Keith Snow Date of Encounter: 04/30/2021  Primary Cardiologist: Gwen Pounds, MD  Subjective   Eating breakfast, no chest pain or shortness of breath at rest. Oxygen at 4L [place].  Inpatient Medications    Scheduled Meds:  amLODipine  10 mg Oral Daily   aspirin EC  81 mg Oral Daily   budesonide (PULMICORT) nebulizer solution  0.5 mg Nebulization BID   bumetanide  3 mg Oral BID   buPROPion  100 mg Oral BID   carvedilol  25 mg Oral BID WC   cloNIDine  0.3 mg Oral TID   ferrous sulfate  325 mg Oral Q breakfast   heparin  5,000 Units Subcutaneous Q8H   hydrALAZINE  100 mg Oral Q8H   insulin aspart  0-5 Units Subcutaneous QHS   insulin aspart  0-9 Units Subcutaneous TID WC   insulin aspart  10 Units Subcutaneous TID WC   insulin glargine-yfgn  52 Units Subcutaneous QHS   ipratropium-albuterol  3 mL Nebulization Q6H WA   isosorbide mononitrate  30 mg Oral BID   pantoprazole  40 mg Oral Daily   predniSONE  60 mg Oral Q breakfast   rosuvastatin  20 mg Oral Daily   spironolactone  25 mg Oral BID     PRN Meds: acetaminophen **OR** acetaminophen, ipratropium-albuterol, labetalol, ondansetron **OR** ondansetron (ZOFRAN) IV, oxyCODONE, senna-docusate, traZODone   Vital Signs    Vitals:   04/29/21 2042 04/30/21 0516 04/30/21 0627 04/30/21 0820  BP: (!) 148/77 (!) 155/76  (!) 153/81  Pulse: 65 72  66  Resp: 19 20    Temp: 98.2 F (36.8 C) 98.9 F (37.2 C)    TempSrc: Oral Oral    SpO2: 95% 96%    Weight:   102.8 kg   Height:        Intake/Output Summary (Last 24 hours) at 04/30/2021 0849 Last data filed at 04/30/2021 1610 Gross per 24 hour  Intake 800 ml  Output 3402 ml  Net -2602 ml   Filed Weights   04/29/21 0522 04/29/21 0901 04/30/21 0627  Weight: 100.6 kg 98.6 kg 102.8 kg    Telemetry    Sinus rhythm.  Personally reviewed.  ECG    No new ECG reviewed today.  Physical Exam   GEN: No acute distress.   Neck: No  JVD. Cardiac: RRR without gallop.  Physiologically split S2. Respiratory: Nonlabored.  Decreased breath sounds at the left base.  No wheezing or crackles.  No egophony. GI: Soft, nontender, bowel sounds present. MS: No pitting edema; No deformity.  Labs    Chemistry Recent Labs  Lab 04/28/21 0613 04/28/21 1815 04/29/21 0555 04/30/21 0630  NA 136  --  136  137 137  K 3.8  --  3.9  3.9 4.3  CL 102  --  99  100 99  CO2 24  --  27  28 28   GLUCOSE 233* 370* 219*  221* 219*  BUN 101*  --  87*  86* 82*  CREATININE 2.46*  --  2.17*  2.21* 2.10*  CALCIUM 8.4*  --  8.6*  8.7* 8.5*  ALBUMIN 2.3*  --  2.4*  2.4* 2.4*  GFRNONAA 30*  --  34*  34* 36*  ANIONGAP 10  --  10  9 10      Hematology Recent Labs  Lab 04/29/21 0555  WBC 16.3*  RBC 3.28*  HGB 10.0*  HCT 32.5*  MCV 99.1  MCH 30.5  MCHC  30.8  RDW 17.9*  PLT 276    Cardiac Enzymes Recent Labs  Lab 04/10/21 0009 04/10/21 0215  TROPONINIHS 91* 87*    Radiology    DG Chest 2 View  Result Date: 04/29/2021 CLINICAL DATA:  Shortness of breath. EXAM: CHEST - 2 VIEW COMPARISON:  April 27, 2021. FINDINGS: Stable cardiomediastinal silhouette. Stable bilateral lung opacities are noted, right greater than left. Small left pleural effusion is noted. Bony thorax is unremarkable. IMPRESSION: Stable bilateral lung opacities as described above, right greater than left. Electronically Signed   By: Marijo Conception M.D.   On: 04/29/2021 08:55    Cardiac Studies   Echocardiogram 04/10/2021:  1. Left ventricular ejection fraction, by estimation, is 55 to 60%. The  left ventricle has normal function. The left ventricle has no regional  wall motion abnormalities. There is moderate concentric left ventricular  hypertrophy. Left ventricular  diastolic parameters are consistent with Grade II diastolic dysfunction  (pseudonormalization).   2. Right ventricular systolic function is normal. The right ventricular  size is normal.  There is normal pulmonary artery systolic pressure. The  estimated right ventricular systolic pressure is 16.5 mmHg.   3. Left atrial size was moderately dilated.   4. Right atrial size was mildly dilated.   5. The mitral valve is grossly normal. No evidence of mitral valve  regurgitation. No evidence of mitral stenosis.   6. The aortic valve is tricuspid. Aortic valve regurgitation is not  visualized. No aortic stenosis is present.   7. The inferior vena cava is normal in size with greater than 50%  respiratory variability, suggesting right atrial pressure of 3 mmHg.   Assessment & Plan    1.  Acute on chronic diastolic heart failure, LVEF 55 to 60% with moderate LVH, moderate diastolic dysfunction, and normal RV contraction as well as estimated RVSP.  Outside records from his primary cardiologist indicate negative PYP scan for TTR amyloidosis.  Net urine output 2600 cc last 24 hours.  He remains on oral Bumex.  2.  Acute hypoxic respiratory failure with continued oxygen requirement.  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  Still with oxygen requirement, most recently 4 to 5 L nasal cannula and desaturation with activity.  3.  AKI on CKD stage IIIb.  Baseline creatinine 1.7-2.1, creatinine has come down to 2.10.  4.  Uncontrolled type 2 diabetes mellitus.  Hemoglobin A1c 10.3%.  Presently on insulin, had been on Jardiance as an outpatient per records but not good candidate for SGLT2 inhibitor at this time due to degree of renal insufficiency.  5.  Essential hypertension, on multimodal therapy.  6.  Mixed hyperlipidemia, on Crestor.  Continue Coreg, Norvasc, clonidine, hydralazine, Imdur, Aldactone and Bumex.  He is not a candidate for ARB, ANRI, or SGLT2 inhibitor at this time, but if creatinine continues to improve to baseline he may be able to resume Entresto with simplification of current regimen - would  most likely make this transition as an outpatient once he gets back to see his regular cardiologist.  Continue Bumex although reduce dose to 2 mg twice daily. Question whether he needs a follow-up chest CT, will defer to primary team and Pulmonary.  Signed, Rozann Lesches, MD  04/30/2021, 8:49 AM

## 2021-04-30 NOTE — Progress Notes (Signed)
    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

## 2021-04-30 NOTE — Progress Notes (Signed)
PROGRESS NOTE  Keith Snow VEL:[id number] DOB: 08/10/1960 DOA: 04/09/2021 PCP: System, Provider Not In   Brief History:  60 year old with history of DM2, HTN, MI, CHF comes to the ED with change in mental status.  Upon admission he was noted to have focal consolidation in the right lower lung, elevated BNP.  He was also hyperglycemic.  CT of the chest showed combination of severe bilateral bronchopneumonia and possible effusion.  It has been difficult to diurese him due to worsening renal function.  Nephrology and Pulm consulted for their input. Started on [employer] Spectrum Abx and Steroids. Cr continues to increase slowly.    Assessment/Plan: Acute hypoxic respiratory failure requiring BiPAP and [place] oxygen- Multifactorial.  Combination of PNA, COPD and CHF exacerbation. -04/12/21 --CT chest Severe b/l BronchoPNA with small b/l parapneumonic effusions.  -04/20/21---CXR personally reviewed-unchanged diffuse bilateral pulm infiltrates -Still requiring  nasal cannula supplementation at 5L/min with intermittent use of BiPAP.  --Appreciate assistance by pulmonology service, nephrology and cardiology service. -Patient continue to improve slowly- -Physical therapy recommending home health services. -Continue weaning off oxygen supplementation as tolerated. --DOE persist, Hypoxia persist (5L/min), desaturates quickly -please see oxygen qual note dated 04/30/2021 -Discussed with Dr. Halford Chessman from PCCM again on 04/30/2021 he recommends very slow prednisone taper at discharge and patient she will stay on prednisone 20 mg daily until he is seen by pulmonologist in [place] ( do not taper off prednisone completely until he is seen by pulmonologist [place])   Community-acquired pneumonia. Aspiration PNA?  Concerns for BOOP - Pro-Cal 0.54>>0.34  -Cleared by speech and swallow for regular diet. - Bronchodilators scheduled and as needed.  I-S/flutter -Speech and swallow eval-regular diet -MRSA  neg -04/19/21-Pulmonology- Dr. Alfonzo Beers to po steroids, and assess response. -patient has finished 7 days zosyn; remained afebrile. -c/n long/slow steroids--please see steroid taper as above -Very slow improvement from a pulmonary and hypoxia standpoint  Diabetes mellitus type 2, uncontrolled with hyperglycemia - 04/10/21 A1c-10.3 - Continue current dose of long-acting insulin and continue sliding scale -Anticipate erratic sugars with steroid therapy  Acute on chronic congestive heart failure with preserved EF, grade 2 DD, class III -04/10/21 Echo 55-60%, grade 2 DD, moderate LVH - Lasix IV by nephrology>>transition to 1/2 home dose bumex -9/26 discussed with renal--Dr. Joelyn Oms avoid ACEI/ARB/ARNI/SGLT2, Delene Loll due to renal concerns -PTA patient was on Entresto, - held due to AKI.  -Continue Aldactone -Continue to follow I's and O's -Continue low-sodium diet. -Continues to diurese well   Acute kidney injury on CKD stage IIIa - Admission creatinine 1.8, 2.63 > 2.71 > 3.17 >3.49 >3.84>>3.04>>>2.32 >2.21 ---Currently producing about 1.5 L of urine over last 24 hours.   -Creatinine and BUN improving -Renal ultrasound is negative for acute pathology.   previously on Bumex 2 mg bid when he lived in [place]   Acute metabolic encephalopathy -Resolved.  -Mentation back to baseline. -Patient is oriented x3 and following commands appropriately.   Essential hypertension - Continue the use of Coreg,  hydralazine, clonidine, imdur and amlodipine. -nifedipine stopped due to concerns for shunt -Continue to follow vital signs and further adjust antihypertensive treatment as needed -Following nephrology service recommendation and spironolactone has been added.  Hyperlipidemia - Continue the use of Crestor  Obesity/OSA---Patient has been refusing to use his BiPAP/CPAP in the hospital for the last couple of nights -Patient's wife is at bedside states that patient is typically noncompliant with  CPAP use at home--compliance encouraged   Status is:  Inpatient   Remains inpatient appropriate because:Inpatient level of care appropriate due to severity of illness   Dispo: The patient is from: Home              Anticipated d/c is to: Home with home O2              Patient currently is not medically stable to d/c.  Making progress and hopefully will fully stabilized for home discharge in the next 1 day-hypoxia and dyspnea on exertion improving slowly              difficult to place patient No       Family Communication:   Wife updated    Consultants:  pulm   Code Status:  FULL    DVT Prophylaxis:  [place] Heparin     Procedures: As Listed in Progress Note Above   Antibiotics: Zosyn 9/20>>9/27   Subjective: -Patient has been refusing to use his BiPAP/CPAP in the hospital for the last couple of nights  -Patient's wife is at bedside states that patient is typically noncompliant with CPAP use at home--compliance encouraged  Objective: Vitals:   04/30/21 0941 04/30/21 0953 04/30/21 1304 04/30/21 1544  BP:   139/78   Pulse:   65   Resp:   20   Temp:   97.8 F (36.6 C)   TempSrc:   Oral   SpO2:  98% 94% 94%  Weight: 97.9 kg     Height:        Intake/Output Summary (Last 24 hours) at 04/30/2021 1847 Last data filed at 04/30/2021 1300 Gross per 24 hour  Intake 840 ml  Output 3052 ml  Net -2212 ml   Weight change: -2.041 kg  Exam:  Physical Exam Gen:- Awake Alert,  in no apparent distress , DOE persist HEENT:- [place].AT, No sclera icterus Nose- [place] 5L/min Neck-Supple Neck,No JVD,.  Lungs-  Fair air movement, scattered bil rhonchi CV- S1, S2 normal, RRR Abd-  +ve B.Sounds, Abd Soft, No tenderness,    Extremity/Skin:- No  edema,  good pulses Psych-affect is appropriate, oriented x3 Neuro-Generalized weakness, no new focal deficits, no tremors   Data Reviewed: I have personally reviewed following labs and imaging studies  Basic Metabolic Panel: Recent Labs  Lab  04/26/21 0420 04/27/21 0439 04/28/21 0613 04/28/21 1815 04/29/21 0555 04/30/21 0630  NA 135 135 136  --  136  137 137  K 4.3 4.2 3.8  --  3.9  3.9 4.3  CL 103 102 102  --  99  100 99  CO2 23 22 24   --  27  28 28   GLUCOSE 272* 271* 233* 370* 219*  221* 219*  BUN 105* 105* 101*  --  87*  86* 82*  CREATININE 2.43* 2.44* 2.46*  --  2.17*  2.21* 2.10*  CALCIUM 8.2* 8.4* 8.4*  --  8.6*  8.7* 8.5*  PHOS 3.4 3.5 3.4  --  3.8  3.8 4.2   Liver Function Tests: Recent Labs  Lab 04/26/21 0420 04/27/21 0439 04/28/21 0613 04/29/21 0555 04/30/21 0630  ALBUMIN 2.4* 2.5* 2.3* 2.4*  2.4* 2.4*   CBC: Recent Labs  Lab 04/29/21 0555  WBC 16.3*  HGB 10.0*  HCT 32.5*  MCV 99.1  PLT 276    CBG: Recent Labs  Lab 04/29/21 1653 04/29/21 2044 04/30/21 0715 04/30/21 1122 04/30/21 1620  GLUCAP 82 177* 225* 225* 231*   Urine analysis:    Component Value Date/Time   COLORURINE YELLOW 04/14/2021  [place] 04/14/2021 1803   LABSPEC >1.030 (H) 04/14/2021 1803   PHURINE 5.0 04/14/2021 1803   GLUCOSEU NEGATIVE 04/14/2021 1803   HGBUR NEGATIVE 04/14/2021 1803   BILIRUBINUR NEGATIVE 04/14/2021 1803   KETONESUR NEGATIVE 04/14/2021 1803   PROTEINUR TRACE (A) 04/14/2021 1803   NITRITE NEGATIVE 04/14/2021 1803   LEUKOCYTESUR NEGATIVE 04/14/2021 1803   No results found for this or any previous visit (from the past 240 hour(s)).   Scheduled Meds:  amLODipine  10 mg Oral Daily   aspirin EC  81 mg Oral Daily   budesonide (PULMICORT) nebulizer solution  0.5 mg Nebulization BID   bumetanide  2 mg Oral BID   buPROPion  100 mg Oral BID   carvedilol  25 mg Oral BID WC   cloNIDine  0.3 mg Oral TID   ferrous sulfate  325 mg Oral Q breakfast   heparin  5,000 Units Subcutaneous Q8H   hydrALAZINE  100 mg Oral Q8H   insulin aspart  0-5 Units Subcutaneous QHS   insulin aspart  0-9 Units Subcutaneous TID WC   insulin aspart  10 Units Subcutaneous TID WC   insulin  glargine-yfgn  52 Units Subcutaneous QHS   ipratropium-albuterol  3 mL Nebulization Q6H WA   isosorbide mononitrate  30 mg Oral BID   pantoprazole  40 mg Oral Daily   predniSONE  60 mg Oral Q breakfast   rosuvastatin  20 mg Oral Daily   spironolactone  25 mg Oral BID   Procedures/Studies: DG Chest 2 View  Result Date: 04/29/2021 CLINICAL DATA:  Shortness of breath. EXAM: CHEST - 2 VIEW COMPARISON:  April 27, 2021. FINDINGS: Stable cardiomediastinal silhouette. Stable bilateral lung opacities are noted, right greater than left. Small left pleural effusion is noted. Bony thorax is unremarkable. IMPRESSION: Stable bilateral lung opacities as described above, right greater than left. Electronically Signed   By: Marijo Conception M.D.   On: 04/29/2021 08:55   CT CHEST WO CONTRAST  Result Date: 04/12/2021 CLINICAL DATA:  60 year old male with history of acute hypoxic respiratory failure requiring BiPAP. EXAM: CT CHEST WITHOUT CONTRAST TECHNIQUE: Multidetector CT imaging of the chest was performed following the standard protocol without IV contrast. COMPARISON:  No priors. FINDINGS: Cardiovascular: Heart size is normal. There is no significant pericardial fluid, thickening or pericardial calcification. There is aortic atherosclerosis, as well as atherosclerosis of the great vessels of the mediastinum and the coronary arteries, including calcified atherosclerotic plaque in the left anterior descending and left circumflex coronary arteries. Mediastinum/Nodes: Multiple prominent borderline enlarged mediastinal and bilateral hilar lymph nodes are noted, nonspecific and likely reactive. Esophagus is unremarkable in appearance. No axillary lymphadenopathy. Lungs/Pleura: Small bilateral pleural effusions (right greater than left) lying dependently. Patchy multifocal airspace consolidation most evident in a peribronchovascular distribution with surrounding ground-glass attenuation and septal thickening. Upper  Abdomen: Unremarkable. Musculoskeletal: There are no aggressive appearing lytic or blastic lesions noted in the visualized portions of the skeleton. Multiple old healed rib fractures. IMPRESSION: 1. Severe multilobar bilateral bronchopneumonia with small bilateral parapneumonic pleural effusions lying dependently, as above. 2. Aortic atherosclerosis, in addition to 2 vessel coronary artery disease. Please note that although the presence of coronary artery calcium documents the presence of coronary artery disease, the severity of this disease and any potential stenosis cannot be assessed on this non-gated CT examination. Assessment for potential risk factor modification, dietary therapy or pharmacologic therapy may be warranted, if clinically indicated. Aortic Atherosclerosis (ICD10-I70.0). Electronically Signed  By: Vinnie Langton M.D.   On: 04/12/2021 18:58   US RENAL  Result Date: 04/14/2021 CLINICAL DATA:  Acute renal injury EXAM: RENAL / URINARY TRACT ULTRASOUND COMPLETE COMPARISON:  None. FINDINGS: Right Kidney: Renal measurements: 12.3 x 4.5 x 5.7 cm. = volume: 165 mL. Echogenicity within normal limits. No mass or hydronephrosis visualized. Left Kidney: Renal measurements: 10.9 x 6.0 x 4.9 cm. = volume: 66 mL. Echogenicity within normal limits. No mass or hydronephrosis visualized. Bladder: Bladder is well distended. Mild wall thickening is noted anteriorly measuring up to 9 mm. This could be related to underlying UTI. Clinical correlation is recommended. Other: None. IMPRESSION: Normal-appearing kidneys bilaterally. Mild bladder wall thickening which may be inflammatory in nature. Clinical correlation is recommended. Electronically Signed   By: Inez Catalina M.D.   On: 04/14/2021 15:54   DG Chest Port 1 View  Result Date: 04/27/2021 CLINICAL DATA:  Follow-up pulmonary infiltrates EXAM: PORTABLE CHEST 1 VIEW COMPARISON:  04/21/2021 FINDINGS: Cardiac shadow is enlarged but stable. Patchy airspace  opacity is identified bilaterally with small left pleural effusion stable in appearance from the prior exam. No bony abnormality is noted. IMPRESSION: No change from previous exam. Electronically Signed   By: Inez Catalina M.D.   On: 04/27/2021 00:33   DG Chest Port 1 View  Result Date: 04/21/2021 CLINICAL DATA:  Shortness of breath EXAM: PORTABLE CHEST 1 VIEW COMPARISON:  04/21/2021 FINDINGS: Bilateral interstitial and patchy alveolar airspace opacities. Small left pleural effusion. No pneumothorax. Stable cardiomegaly. No acute osseous abnormality. IMPRESSION: 1. Cardiomegaly with bilateral interstitial and alveolar airspace opacities and trace left pleural effusion. Differential considerations include pulmonary edema versus multilobar pneumonia. Electronically Signed   By: Kathreen Devoid M.D.   On: 04/21/2021 19:31   DG CHEST PORT 1 VIEW  Result Date: 04/21/2021 CLINICAL DATA:  Hypoxia EXAM: PORTABLE CHEST 1 VIEW COMPARISON:  Chest x-ray dated April 20, 2021 FINDINGS: Unchanged cardiomegaly. Bilateral heterogeneous opacities with slightly more focal appearance the right mid lung, likely due to superimposed atelectasis. Small bilateral pleural effusions. No evidence of pneumothorax. IMPRESSION: Similar bilateral heterogeneous pulmonary opacities and small bilateral pleural effusions. Electronically Signed   By: Yetta Glassman M.D.   On: 04/21/2021 08:11   DG Chest Port 1 View  Result Date: 04/20/2021 CLINICAL DATA:  Acute respiratory failure EXAM: PORTABLE CHEST 1 VIEW COMPARISON:  04/14/2021 FINDINGS: Bilateral airspace disease again noted, unchanged. Heart is mildly enlarged. Possible small layering left effusion. Scattered aortic calcifications. No acute bony abnormality. IMPRESSION: Diffuse bilateral airspace disease, unchanged. Suspect layering left effusion. Electronically Signed   By: Rolm Baptise M.D.   On: 04/20/2021 08:02   DG Chest Port 1 View  Result Date: 04/14/2021 CLINICAL DATA:   Shortness of breath, history of stroke, diabetes mellitus, hypertension, MI, cancer EXAM: PORTABLE CHEST 1 VIEW COMPARISON:  Portable exam 1340 hours compared to 04/10/2021 FINDINGS: Enlargement of cardiac silhouette with pulmonary vascular congestion. Stable mediastinal contours. BILATERAL pulmonary infiltrates question pulmonary edema, slightly greater at RIGHT base. Small BILATERAL pleural effusions. No pneumothorax or acute osseous findings. IMPRESSION: Enlargement of cardiac silhouette with pulmonary vascular congestion and mild pulmonary edema. Small BILATERAL pleural effusions. Electronically Signed   By: Lavonia Dana M.D.   On: 04/14/2021 16:01   DG CHEST PORT 1 VIEW  Result Date: 04/10/2021 CLINICAL DATA:  Weakness and fatigue. EXAM: PORTABLE CHEST 1 VIEW COMPARISON:  04/09/2021 FINDINGS: 0657 hours. The cardio pericardial silhouette is enlarged. Focal airspace disease again noted right base with some  probable atelectasis or infiltrate in the retrocardiac left base. There is pulmonary vascular congestion without overt pulmonary edema. Superimposed component of interstitial pulmonary edema suspected. IMPRESSION: Asymmetric focal airspace disease right base with some probable atelectasis or infiltrate in the retrocardiac left base. Interstitial edema not excluded. Electronically Signed   By: Misty Stanley M.D.   On: 04/10/2021 07:33   DG Chest Port 1 View  Result Date: 04/09/2021 CLINICAL DATA:  Fever and weakness EXAM: PORTABLE CHEST 1 VIEW COMPARISON:  None. FINDINGS: The heart and mediastinal contours are within normal limits. Aortic calcification. Right lower lung zone focal consolidation. Question retrocardiac opacity. No pulmonary edema. Possible trace left pleural effusion. No right pleural effusion. No pneumothorax. No acute osseous abnormality. IMPRESSION: Right lower lung zone focal consolidation. Question retrocardiac opacity with possible trace left pleural effusion. Findings consistent  with infection/inflammation. Followup PA and lateral chest X-ray is recommended in 3-4 weeks following therapy to ensure resolution and exclude underlying malignancy. Electronically Signed   By: Iven Finn M.D.   On: 04/09/2021 23:59   ECHOCARDIOGRAM COMPLETE  Result Date: 04/10/2021    ECHOCARDIOGRAM REPORT   Patient Name:   Keith Snow Date of Exam: 04/10/2021 Medical Rec #:  [id number]    Height:       69.0 in Accession #:    [id number]   Weight:       217.0 lb Date of Birth:  Dec 02, 1960    BSA:          2.139 m Patient Age:    15 years     BP:           142/83 mmHg Patient Gender: M            HR:           85 bpm. Exam Location:  [facility] Procedure: 2D Echo, Cardiac Doppler and Color Doppler Indications:    SBE I33.9  History:        Patient has no prior history of Echocardiogram examinations.                 Previous Myocardial Infarction, Stroke; Risk                 Factors:Hypertension and Diabetes. Hx of cancer. 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  Sonographer:    Alvino Chapel RCS Referring Phys: [place]  1. Left ventricular ejection fraction, by estimation, is 55 to 60%. The left ventricle has normal function. The left ventricle has no regional wall motion abnormalities. There is moderate concentric left ventricular hypertrophy. Left ventricular diastolic parameters are consistent with Grade II diastolic dysfunction (pseudonormalization).  2. Right ventricular systolic function is normal. The right ventricular size is normal. There is normal pulmonary artery systolic pressure. The estimated right ventricular systolic pressure is 92.3 mmHg.  3. Left atrial size was moderately dilated.  4. Right atrial size was mildly dilated.  5. The mitral valve is grossly normal. No evidence of mitral valve regurgitation. No evidence of mitral stenosis.  6. The aortic valve is tricuspid. Aortic valve regurgitation is not visualized. No aortic stenosis is  present.  7. The inferior vena cava is normal in size with greater than 50% respiratory variability, suggesting right atrial pressure of 3 mmHg. FINDINGS  Left Ventricle: Left ventricular ejection fraction, by estimation, is 55 to 60%. The left ventricle has normal function.  The left ventricle has no regional wall motion abnormalities. The left ventricular internal cavity size was normal in size. There is  moderate concentric left ventricular hypertrophy. Left ventricular diastolic parameters are consistent with Grade II diastolic dysfunction (pseudonormalization). Right Ventricle: The right ventricular size is normal. No increase in right ventricular wall thickness. Right ventricular systolic function is normal. There is normal pulmonary artery systolic pressure. The tricuspid regurgitant velocity is 2.10 m/s, and  with an assumed right atrial pressure of 3 mmHg, the estimated right ventricular systolic pressure is 96.7 mmHg. Left Atrium: Left atrial size was moderately dilated. Right Atrium: Right atrial size was mildly dilated. Pericardium: There is no evidence of pericardial effusion. Mitral Valve: The mitral valve is grossly normal. No evidence of mitral valve regurgitation. No evidence of mitral valve stenosis. Tricuspid Valve: The tricuspid valve is grossly normal. Tricuspid valve regurgitation is trivial. No evidence of tricuspid stenosis. Aortic Valve: The aortic valve is tricuspid. Aortic valve regurgitation is not visualized. No aortic stenosis is present. Pulmonic Valve: The pulmonic valve was grossly normal. Pulmonic valve regurgitation is not visualized. No evidence of pulmonic stenosis. Aorta: The aortic root is normal in size and structure. Venous: The inferior vena cava is normal in size with greater than 50% respiratory variability, suggesting right atrial pressure of 3 mmHg. IAS/Shunts: The atrial septum is grossly normal.  LEFT VENTRICLE PLAX 2D LVIDd:         4.70 cm  Diastology LVIDs:          3.10 cm  LV e' medial:    6.74 cm/s LV PW:         1.50 cm  LV E/e' medial:  16.5 LV IVS:        1.50 cm  LV e' lateral:   9.14 cm/s LVOT diam:     2.00 cm  LV E/e' lateral: 12.1 LV SV:         65 LV SV Index:   31 LVOT Area:     3.14 cm  RIGHT VENTRICLE RV S prime:     13.80 cm/s TAPSE (M-mode): 2.6 cm LEFT ATRIUM             Index       RIGHT ATRIUM           Index LA diam:        4.20 cm 1.96 cm/m  RA Area:     24.30 cm LA Vol (A2C):   79.4 ml 37.12 ml/m RA Volume:   81.40 ml  38.06 ml/m LA Vol (A4C):   93.6 ml 43.76 ml/m LA Biplane Vol: 86.2 ml 40.30 ml/m  AORTIC VALVE LVOT Vmax:   102.00 cm/s LVOT Vmean:  62.500 cm/s LVOT VTI:    0.208 m  AORTA Ao Root diam: 3.60 cm MITRAL VALVE                TRICUSPID VALVE MV Area (PHT): 4.15 cm     TR Peak grad:   17.6 mmHg MV Decel Time: 183 msec     TR Vmax:        210.00 cm/s MV E velocity: 111.00 cm/s MV A velocity: 79.70 cm/s   SHUNTS MV E/A ratio:  1.39         Systemic VTI:  0.21 m                             Systemic Diam: 2.00 cm Eleonore Chiquito  MD Electronically signed by Eleonore Chiquito MD Signature Date/Time: 04/10/2021/1:25:55 PM    Final    [place] EKG SITE RITE  Result Date: 04/17/2021 If Site Rite image not attached, placement could not be confirmed due to current cardiac rhythm.   Roxan Hockey, MD  [facility]  If 7PM-7AM, please contact night-coverage [email or web address]  Password [email or web address] 04/30/2021, 6:47 PM   LOS: 20 days

## 2021-04-30 NOTE — Care Management Important Message (Signed)
Important Message  Patient Details  Name: Keith Snow MRN: [id number] Date of Birth: 24-Sep-1960   Medicare Important Message Given:  Yes     [name] 04/30/2021, 3:25 PM

## 2021-05-01 LAB — RENAL FUNCTION PANEL
Albumin: 2.4 g/dL — ABNORMAL LOW (ref 3.5–5.0)
Anion gap: 9 (ref 5–15)
BUN: 74 mg/dL — ABNORMAL HIGH (ref 6–20)
CO2: 30 mmol/L (ref 22–32)
Calcium: 8.8 mg/dL — ABNORMAL LOW (ref 8.9–10.3)
Chloride: 99 mmol/L (ref 98–111)
Creatinine, Ser: 1.94 mg/dL — ABNORMAL HIGH (ref 0.61–1.24)
GFR, Estimated: 39 mL/min — ABNORMAL LOW (ref >60)
Glucose, Bld: 76 mg/dL (ref 70–99)
Phosphorus: 3.7 mg/dL (ref 2.5–4.6)
Potassium: 3.9 mmol/L (ref 3.5–5.1)
Sodium: 138 mmol/L (ref 135–145)

## 2021-05-01 LAB — BASIC METABOLIC PANEL
Anion gap: 11 (ref 5–15)
BUN: 70 mg/dL — ABNORMAL HIGH (ref 6–20)
CO2: 27 mmol/L (ref 22–32)
Calcium: 8.3 mg/dL — ABNORMAL LOW (ref 8.9–10.3)
Chloride: 96 mmol/L — ABNORMAL LOW (ref 98–111)
Creatinine, Ser: 2.06 mg/dL — ABNORMAL HIGH (ref 0.61–1.24)
GFR, Estimated: 37 mL/min — ABNORMAL LOW (ref >60)
Glucose, Bld: 176 mg/dL — ABNORMAL HIGH (ref 70–99)
Potassium: 3.8 mmol/L (ref 3.5–5.1)
Sodium: 134 mmol/L — ABNORMAL LOW (ref 135–145)

## 2021-05-01 LAB — GLUCOSE, CAPILLARY
Glucose-Capillary: 58 mg/dL — ABNORMAL LOW (ref 70–99)
Glucose-Capillary: 130 mg/dL — ABNORMAL HIGH (ref 70–99)
Glucose-Capillary: 206 mg/dL — ABNORMAL HIGH (ref 70–99)

## 2021-05-01 LAB — HEMOGLOBIN AND HEMATOCRIT, BLOOD
HCT: 33.7 % — ABNORMAL LOW (ref 39.0–52.0)
Hemoglobin: 10.6 g/dL — ABNORMAL LOW (ref 13.0–17.0)

## 2021-05-01 MED ORDER — BUMETANIDE 1 MG PO TABS: 1.0000 mg | ORAL_TABLET | ORAL | 2 refills | Status: AC

## 2021-05-01 MED ORDER — ASPIRIN EC 81 MG PO TBEC: 81.0000 mg | DELAYED_RELEASE_TABLET | Freq: Every day | ORAL | 11 refills | Status: AC

## 2021-05-01 MED ORDER — CLONIDINE HCL 0.2 MG PO TABS: 0.2000 mg | ORAL_TABLET | Freq: Three times a day (TID) | ORAL | 3 refills | Status: AC

## 2021-05-01 MED ORDER — PANTOPRAZOLE SODIUM 40 MG PO TBEC: 40.0000 mg | DELAYED_RELEASE_TABLET | Freq: Every day | ORAL | 5 refills | Status: AC

## 2021-05-01 MED ORDER — IPRATROPIUM-ALBUTEROL 0.5-2.5 (3) MG/3ML IN SOLN: 3.0000 mL | Freq: Three times a day (TID) | RESPIRATORY_TRACT | 5 refills | Status: AC

## 2021-05-01 MED ORDER — INSULIN ASPART 100 UNIT/ML ~~LOC~~ SOLN: 0.0000 [IU] | Freq: Three times a day (TID) | SUBCUTANEOUS | 11 refills | Status: AC

## 2021-05-01 MED ORDER — TRAZODONE HCL 100 MG PO TABS: 100.0000 mg | ORAL_TABLET | Freq: Every evening | ORAL | 3 refills | Status: AC | PRN

## 2021-05-01 MED ORDER — FERROUS SULFATE 325 (65 FE) MG PO TABS: 325.0000 mg | ORAL_TABLET | Freq: Every day | ORAL | 3 refills | Status: AC

## 2021-05-01 MED ORDER — OXYCODONE HCL 5 MG PO TABS: 5.0000 mg | ORAL_TABLET | ORAL | 0 refills | Status: AC | PRN

## 2021-05-01 MED ORDER — CARVEDILOL 25 MG PO TABS: 25.0000 mg | ORAL_TABLET | Freq: Two times a day (BID) | ORAL | 3 refills | Status: AC

## 2021-05-01 MED ORDER — ACETAMINOPHEN 325 MG PO TABS: 650.0000 mg | ORAL_TABLET | Freq: Four times a day (QID) | ORAL | 0 refills | Status: AC | PRN

## 2021-05-01 MED ORDER — SPIRONOLACTONE 25 MG PO TABS: 25.0000 mg | ORAL_TABLET | Freq: Two times a day (BID) | ORAL | 3 refills | Status: AC

## 2021-05-01 MED ORDER — PREDNISONE 10 MG PO TABS: 20.0000 mg | ORAL_TABLET | ORAL | 3 refills | Status: AC

## 2021-05-01 MED ORDER — SENNOSIDES-DOCUSATE SODIUM 8.6-50 MG PO TABS: 2.0000 | ORAL_TABLET | Freq: Every evening | ORAL | 3 refills | Status: AC | PRN

## 2021-05-01 MED ORDER — PREDNISONE 10 MG PO TABS: 10.0000 mg | ORAL_TABLET | Freq: Every day | ORAL | 3 refills | Status: DC

## 2021-05-01 MED ORDER — PREDNISONE 10 MG PO TABS: 10.0000 mg | ORAL_TABLET | Freq: Every day | ORAL | 0 refills | Status: DC

## 2021-05-01 MED ORDER — AMLODIPINE BESYLATE 10 MG PO TABS: 10.0000 mg | ORAL_TABLET | Freq: Every day | ORAL | 5 refills | Status: AC

## 2021-05-01 MED ORDER — ONDANSETRON HCL 4 MG PO TABS: 4.0000 mg | ORAL_TABLET | Freq: Four times a day (QID) | ORAL | 0 refills | Status: AC | PRN

## 2021-05-01 MED ORDER — ISOSORBIDE MONONITRATE ER 30 MG PO TB24: 30.0000 mg | ORAL_TABLET | Freq: Two times a day (BID) | ORAL | 3 refills | Status: AC

## 2021-05-01 MED ORDER — ROSUVASTATIN CALCIUM 20 MG PO TABS: 20.0000 mg | ORAL_TABLET | Freq: Every day | ORAL | 5 refills | Status: AC

## 2021-05-01 MED ORDER — HYDRALAZINE HCL 100 MG PO TABS: 100.0000 mg | ORAL_TABLET | Freq: Two times a day (BID) | ORAL | 2 refills | Status: AC

## 2021-05-01 MED ORDER — BASAGLAR KWIKPEN 100 UNIT/ML ~~LOC~~ SOPN: 40.0000 [IU] | PEN_INJECTOR | Freq: Every day | SUBCUTANEOUS | 3 refills | Status: AC

## 2021-05-01 NOTE — Progress Notes (Signed)
Discharge instructions given to patient.  Emphasized education on very low sodium and heart healthy diet, and fluid restriction.  Reinforced knowledge on diabetes, heart failure and symptoms of, as well as signs of infection.  Encouraged patient to call the doctor for questions.  Patient stated understanding.  Patient called daughter for transportation.

## 2021-05-01 NOTE — Discharge Summary (Signed)
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 Echocardiogram examinations.                 Previous Myocardial Infarction, Stroke; Risk                 Factors:Hypertension and Diabetes. Hx of cancer. TBI (traumatic                 brain injury) ([place]) (From Hx).  Sonographer:    Alvino Chapel RCS Referring Phys: [place]  1. Left ventricular ejection fraction, by estimation, is 55 to 60%. The left ventricle has normal function. The left ventricle has no regional wall motion abnormalities. There is moderate concentric left ventricular hypertrophy. Left ventricular diastolic parameters are consistent with Grade II diastolic dysfunction (pseudonormalization).  2. Right ventricular systolic function is normal. The right ventricular size is normal. There is normal pulmonary artery systolic pressure. The estimated right ventricular systolic pressure is 63.7 mmHg.  3. Left atrial size was moderately dilated.  4. Right  atrial size was mildly dilated.  5. The mitral valve is grossly normal. No evidence of mitral valve regurgitation. No evidence of mitral stenosis.  6. The aortic valve is tricuspid. Aortic valve regurgitation is not visualized. No aortic stenosis is present.  7. The inferior vena cava is normal in size with greater than 50% respiratory variability, suggesting right atrial pressure of 3 mmHg. FINDINGS  Left Ventricle: Left ventricular ejection fraction, by estimation, is 55 to 60%. The left ventricle has normal function. The left ventricle has no regional wall motion abnormalities. The left ventricular internal cavity size was normal in size. There is  moderate concentric left ventricular hypertrophy. Left ventricular diastolic parameters are consistent with Grade II diastolic dysfunction (pseudonormalization). Right Ventricle: The right ventricular size is normal. No increase in right ventricular wall thickness. Right ventricular systolic function is normal. There is normal pulmonary artery systolic pressure. The tricuspid regurgitant velocity is 2.10 m/s, and  with an assumed right atrial pressure of 3 mmHg, the estimated right ventricular systolic pressure is 85.8 mmHg. Left Atrium: Left atrial size was moderately dilated. Right Atrium: Right atrial size was mildly dilated. Pericardium: There is no evidence of pericardial effusion. Mitral Valve: The mitral valve is grossly normal. No evidence of mitral valve regurgitation. No evidence of mitral valve stenosis. Tricuspid Valve: The tricuspid valve is grossly normal. Tricuspid valve regurgitation is trivial. No evidence of tricuspid stenosis. Aortic Valve: The aortic valve is tricuspid. Aortic valve regurgitation is not visualized. No aortic stenosis is present. Pulmonic Valve: The pulmonic valve was grossly normal. Pulmonic valve regurgitation is not visualized. No evidence of pulmonic stenosis. Aorta: The aortic root is normal in size and structure. Venous: The  inferior vena cava is normal in size with greater than 50% respiratory variability, suggesting right atrial pressure of 3 mmHg. IAS/Shunts: The atrial septum is grossly normal.  LEFT VENTRICLE PLAX 2D LVIDd:         4.70 cm  Diastology LVIDs:         3.10 cm  LV e' medial:    6.74 cm/s LV PW:         1.50 cm  LV E/e' medial:  16.5 LV IVS:  1.50 cm  LV e' lateral:   9.14 cm/s LVOT diam:     2.00 cm  LV E/e' lateral: 12.1 LV SV:         65 LV SV Index:   31 LVOT Area:     3.14 cm  RIGHT VENTRICLE RV S prime:     13.80 cm/s TAPSE (M-mode): 2.6 cm LEFT ATRIUM             Index       RIGHT ATRIUM           Index LA diam:        4.20 cm 1.96 cm/m  RA Area:     24.30 cm LA Vol (A2C):   79.4 ml 37.12 ml/m RA Volume:   81.40 ml  38.06 ml/m LA Vol (A4C):   93.6 ml 43.76 ml/m LA Biplane Vol: 86.2 ml 40.30 ml/m  AORTIC VALVE LVOT Vmax:   102.00 cm/s LVOT Vmean:  62.500 cm/s LVOT VTI:    0.208 m  AORTA Ao Root diam: 3.60 cm MITRAL VALVE                TRICUSPID VALVE MV Area (PHT): 4.15 cm     TR Peak grad:   17.6 mmHg MV Decel Time: 183 msec     TR Vmax:        210.00 cm/s MV E velocity: 111.00 cm/s MV A velocity: 79.70 cm/s   SHUNTS MV E/A ratio:  1.39         Systemic VTI:  0.21 m                             Systemic Diam: 2.00 cm Eleonore Chiquito MD Electronically signed by Eleonore Chiquito MD Signature Date/Time: 04/10/2021/1:25:55 PM    Final    [place] EKG SITE RITE  Result Date: 04/17/2021 If Site Rite image not attached, placement could not be confirmed due to current cardiac rhythm.   Micro Results   No results found for this or any previous visit (from the past 240 hour(s)).     Today   Subjective    Keith Snow today has no new complaints -wife at bedside -Voiding well, renal function improving, = Hypoxia improving -Used BiPAP overnight and feels better this morning          Patient has been seen and examined prior to discharge   Objective   Blood pressure 133/68, pulse 62, temperature  97.9 F (36.6 C), temperature source Oral, resp. rate 18, height 5\' 9"  (1.753 m), weight 96.6 kg, SpO2 97 %.   Intake/Output Summary (Last 24 hours) at 05/01/2021 1617 Last data filed at 05/01/2021 1419 Gross per 24 hour  Intake 1200 ml  Output 2775 ml  Net -1575 ml    Exam Gen:- Awake Alert, no acute distress, speaking in complete sentences HEENT:- [place].AT, No sclera icterus Nose-[place] 3L/min Neck-Supple Neck,No JVD,.  Lungs-improved air movement, no wheezing  CV- S1, S2 normal, regular Abd-  +ve B.Sounds, Abd Soft, No tenderness,    Extremity/Skin:- No  edema,   good pulses Psych-affect is appropriate, oriented x3 Neuro-generalized weakness and unsteady gait which is not new no new focal deficits, no tremors    Data Review   CBC w Diff:  Lab Results  Component Value Date   WBC 16.3 (H) 04/29/2021   HGB 10.6 (L) 05/01/2021   HCT 33.7 (L) 05/01/2021   PLT 276 04/29/2021  LYMPHOPCT 8 04/10/2021   MONOPCT 8 04/10/2021   EOSPCT 1 04/10/2021   BASOPCT 0 04/10/2021    CMP:  Lab Results  Component Value Date   NA 134 (L) 05/01/2021   K 3.8 05/01/2021   CL 96 (L) 05/01/2021   CO2 27 05/01/2021   BUN 70 (H) 05/01/2021   CREATININE 2.06 (H) 05/01/2021   PROT 6.3 (L) 04/21/2021   ALBUMIN 2.4 (L) 05/01/2021   BILITOT 0.6 04/21/2021   ALKPHOS 73 04/21/2021   AST 20 04/21/2021   ALT 24 04/21/2021   Total Discharge time is about 33 minutes  Roxan Hockey M.D on 05/01/2021 at 4:17 PM  Go to [email or web address] -  for contact info  [facility] - Office  [phone]

## 2021-05-01 NOTE — TOC Transition Note (Signed)
Transition of Care [facility]) - CM/SW Discharge Note   Patient Details  Name: Simcha Speir MRN: [id number] Date of Birth: June 18, 1961  [facility]) CM/SW Contact:  Natasha Bence, LCSW Phone Number: 05/01/2021, 4:28 PM   Clinical Narrative:    CSW notified of patient's readiness for discharge. CSW faxed discharge summary to [facility]. TOC signing off.    Final next level of care: [place] Barriers to Discharge: Barriers Resolved   Patient Goals and CMS Choice Patient states their goals for this hospitalization and ongoing recovery are:: Return home with [facility] CMS Medicare.gov Compare Post Acute Care list provided to:: Patient Choice offered to / list presented to : Patient  Discharge Placement                    Patient and family notified of of transfer: 05/01/21  Discharge Plan and Services In-house Referral: Clinical Social Work   Post Acute Care Choice: Museum/gallery conservator, Home Health          DME Arranged: Oxygen DME Agency: [employer] Date DME Agency Contacted: 04/19/21 Time DME Agency Contacted: 1108 Representative spoke with at DME Agency: Caryl Pina HH Arranged: PT [place]: Other - See comment ([facility]) Date [place]: 05/01/21 Time [place]: 1627 Representative spoke with at [place]: Fax  Social Determinants of Health ([place]) Interventions     Readmission Risk Interventions No flowsheet data found.

## 2021-05-01 NOTE — Progress Notes (Addendum)
BG recheck =130, denies any complaints.  Ok to discharge home per Dr. Denton Brick.

## 2021-05-01 NOTE — Progress Notes (Addendum)
BG=58, given grape juice (OJ not available) and sierra mist.  Patient denies any symptoms, no distress.  Dr Denton Brick notified, with new orders made  Will recheck BG in 30  min.

## 2021-05-01 NOTE — Discharge Instructions (Signed)
1)Very low-salt diet advised 2)Weigh yourself daily, call if you gain more than 3 pounds in 1 day or more than 5 pounds in 1 week as your diuretic medications may need to be adjusted 3)Limit your Fluid  intake to no more than 60 ounces (1.8 Liters) per day 4)Please Bumex/Bumetanide Take 2 mg Q am and 1 mg Q pm 5)Take Prednisone 60 mg daily for 5 days, then 50 mg daily for 5 days, then 40 mg daily x 5 days, then 30 mg daily for 5 days, then take Prednisone 20 mg daily indefinitely until you see a Pulmonologist 6)Avoid ibuprofen/Advil/Aleve/Motrin/Goody Powders/Naproxen/BC powders/Meloxicam/Diclofenac/Indomethacin and other Nonsteroidal anti-inflammatory medications as these will make you more likely to bleed and can cause stomach ulcers, can also cause Kidney problems.  7)you need oxygen at home at 3L via nasal cannula continuously while awake and while asleep, you need up to 5L/min of Oxygen with activity--- smoking or having open fires around oxygen can cause fire, significant injury and death 8)Repeat BMP and Repeat CBC Blood Test within 1 week advised 9)Please make an appointment to see a local cardiologist and local pulmonologist in [place] within the next 1 to 2 weeks 10)Please use your CPAP machine every night----and when you to take naps during the day

## 2021-05-01 NOTE — Progress Notes (Signed)
Patient's spouse and daughter at bedside to pick up patient.  Needs addressed.  Discharged home.

## 2022-09-23 DEATH — deceased

## 2023-05-04 IMAGING — DX DG CHEST 1V PORT
1 series · 1 of 1 positions shown · non-contrast
Comparison: 04/21/2021

CLINICAL DATA: Follow-up pulmonary infiltrates

EXAM:
PORTABLE CHEST 1 VIEW

[chest ap]
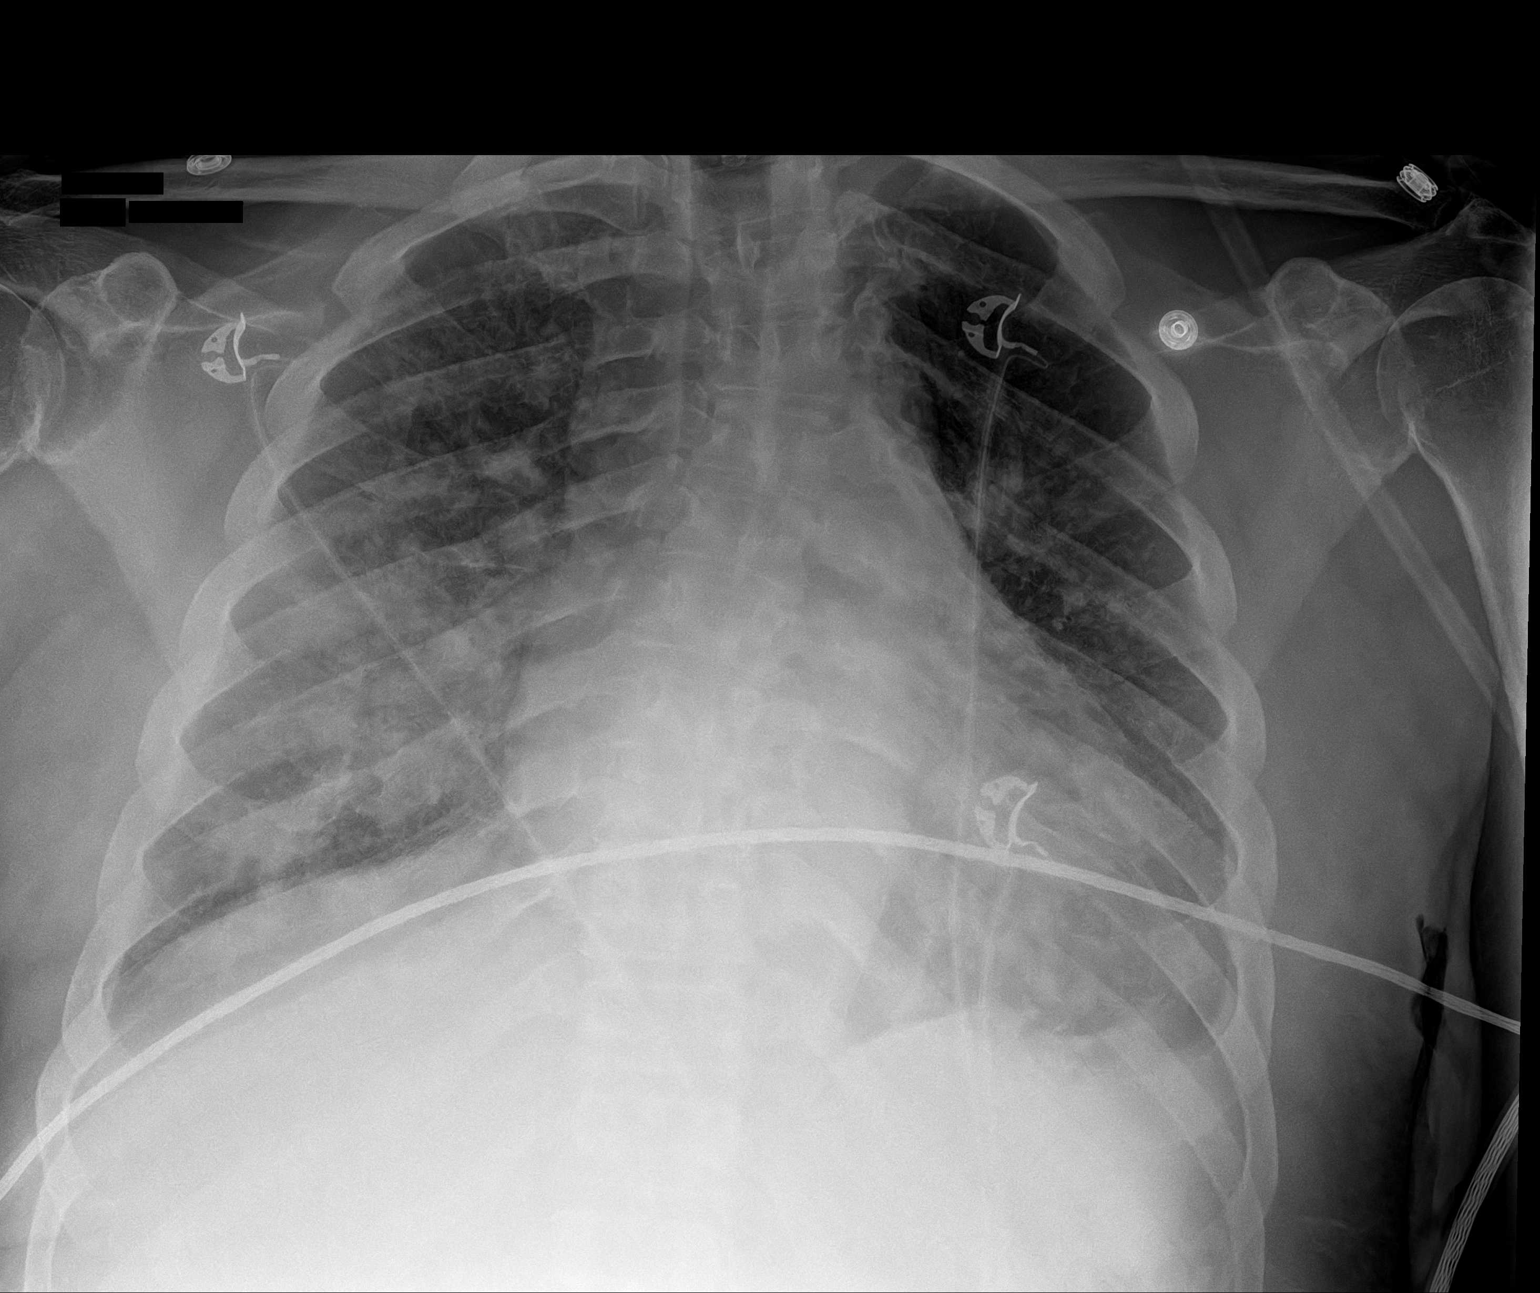

[1 of 1 positions shown; findings below may reference images not displayed]

FINDINGS: Cardiac shadow is enlarged but stable. Patchy airspace opacity is
identified bilaterally with small left pleural effusion stable in
appearance from the prior exam. No bony abnormality is noted.
IMPRESSION: No change from previous exam.

## 2023-05-06 IMAGING — DX DG CHEST 2V
2 series · 2 of 2 positions shown · non-contrast
Comparison: April 27, 2021.

CLINICAL DATA: Shortness of breath.

EXAM:
CHEST - 2 VIEW

[chest lat]
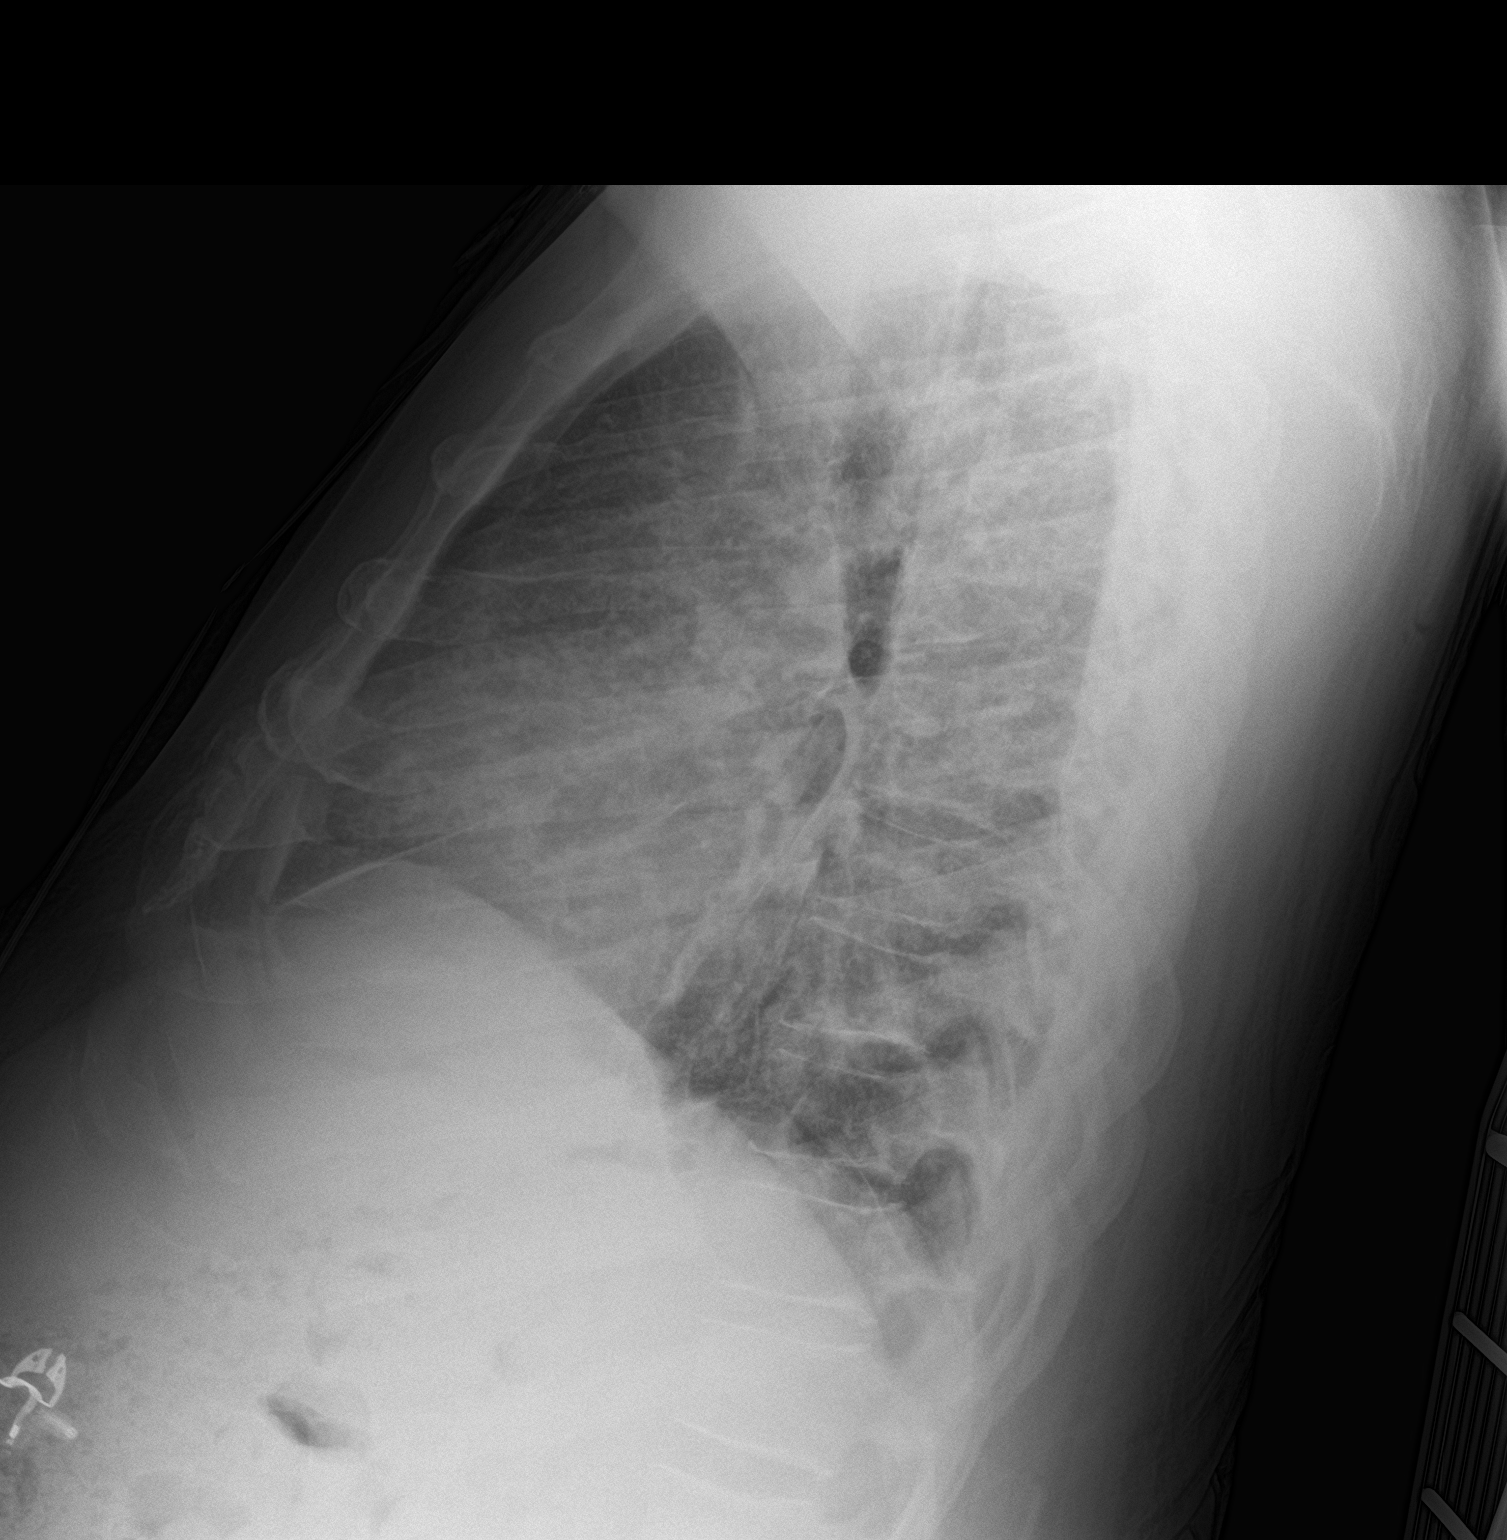

[chest ap]
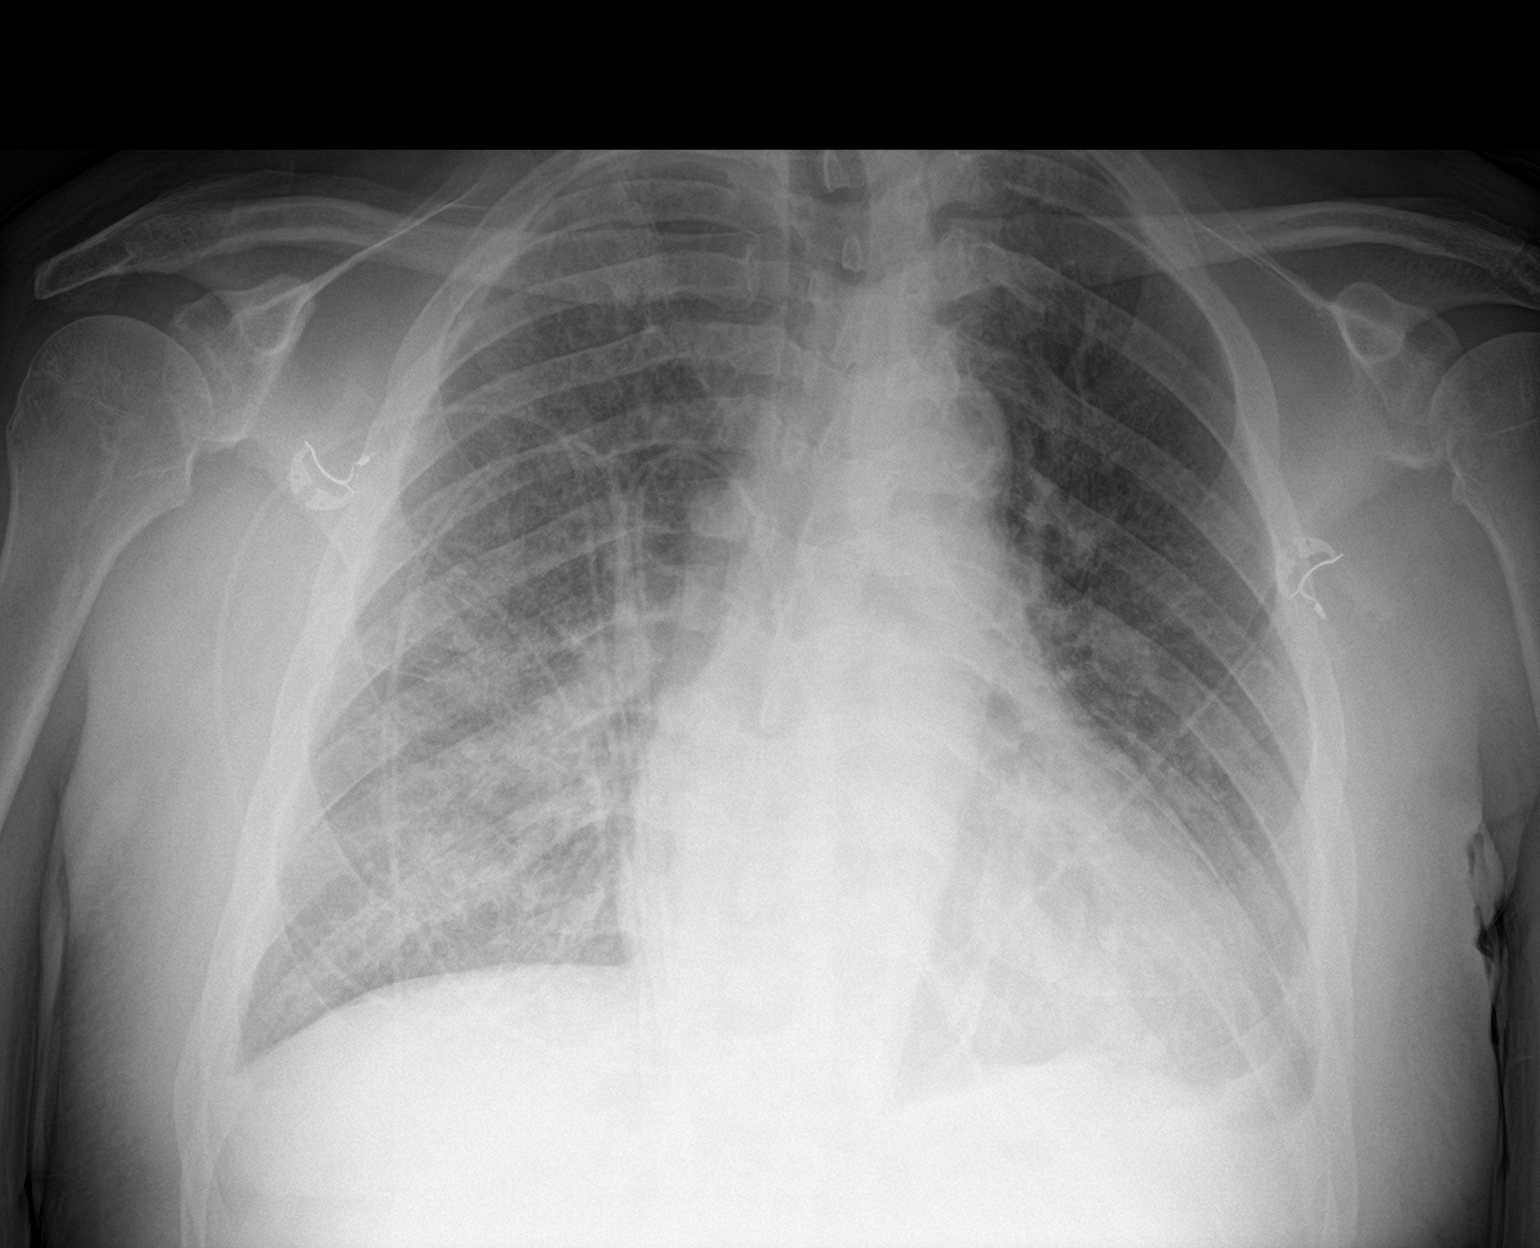

[2 of 2 positions shown; findings below may reference images not displayed]

FINDINGS: Stable cardiomediastinal silhouette. Stable bilateral lung opacities
are noted, right greater than left. Small left pleural effusion is
noted. Bony thorax is unremarkable.
IMPRESSION: Stable bilateral lung opacities as described above, right greater
than left.
